# Patient Record
Sex: Female | Born: 1935 | Race: White | Hispanic: No | Marital: Single | Smoking: Former smoker
Health system: Southern US, Community
[De-identification: ages and names within clinical notes are randomized; demographics above are authoritative.]

## PROBLEM LIST (undated history)

## (undated) DIAGNOSIS — I10 Essential (primary) hypertension: Secondary | ICD-10-CM

## (undated) DIAGNOSIS — G2581 Restless legs syndrome: Secondary | ICD-10-CM

## (undated) DIAGNOSIS — K219 Gastro-esophageal reflux disease without esophagitis: Secondary | ICD-10-CM

## (undated) DIAGNOSIS — E785 Hyperlipidemia, unspecified: Secondary | ICD-10-CM

## (undated) DIAGNOSIS — J449 Chronic obstructive pulmonary disease, unspecified: Secondary | ICD-10-CM

## (undated) DIAGNOSIS — N318 Other neuromuscular dysfunction of bladder: Secondary | ICD-10-CM

## (undated) DIAGNOSIS — C4490 Unspecified malignant neoplasm of skin, unspecified: Secondary | ICD-10-CM

## (undated) HISTORY — DX: Essential (primary) hypertension: I10

## (undated) HISTORY — DX: Hyperlipidemia, unspecified: E78.5

## (undated) HISTORY — PX: SKIN CANCER EXCISION: SHX779

## (undated) HISTORY — DX: Gastro-esophageal reflux disease without esophagitis: K21.9

## (undated) HISTORY — DX: Other neuromuscular dysfunction of bladder: N31.8

## (undated) HISTORY — DX: Unspecified malignant neoplasm of skin, unspecified: C44.90

## (undated) HISTORY — DX: Chronic obstructive pulmonary disease, unspecified: J44.9

## (undated) HISTORY — DX: Restless legs syndrome: G25.81

---

## 1998-12-16 ENCOUNTER — Emergency Department (HOSPITAL_COMMUNITY): Admission: EM | Admit: 1998-12-16 | Discharge: 1998-12-16 | Payer: Self-pay | Admitting: Emergency Medicine

## 1998-12-16 ENCOUNTER — Encounter: Payer: Self-pay | Admitting: Emergency Medicine

## 1999-03-22 ENCOUNTER — Other Ambulatory Visit: Admission: RE | Admit: 1999-03-22 | Discharge: 1999-03-22 | Payer: Self-pay | Admitting: Internal Medicine

## 1999-05-11 ENCOUNTER — Encounter: Payer: Self-pay | Admitting: Internal Medicine

## 1999-05-11 ENCOUNTER — Encounter: Admission: RE | Admit: 1999-05-11 | Discharge: 1999-05-11 | Payer: Self-pay | Admitting: Internal Medicine

## 2000-05-13 ENCOUNTER — Encounter: Admission: RE | Admit: 2000-05-13 | Discharge: 2000-05-13 | Payer: Self-pay | Admitting: Internal Medicine

## 2000-05-13 ENCOUNTER — Encounter: Payer: Self-pay | Admitting: Internal Medicine

## 2000-07-22 ENCOUNTER — Encounter: Admission: RE | Admit: 2000-07-22 | Discharge: 2000-07-22 | Payer: Self-pay | Admitting: Internal Medicine

## 2000-07-22 ENCOUNTER — Encounter: Payer: Self-pay | Admitting: Internal Medicine

## 2001-06-18 ENCOUNTER — Encounter: Admission: RE | Admit: 2001-06-18 | Discharge: 2001-06-18 | Payer: Self-pay | Admitting: Internal Medicine

## 2001-06-18 ENCOUNTER — Encounter: Payer: Self-pay | Admitting: Internal Medicine

## 2002-07-07 ENCOUNTER — Encounter: Payer: Self-pay | Admitting: Internal Medicine

## 2002-07-07 ENCOUNTER — Encounter: Admission: RE | Admit: 2002-07-07 | Discharge: 2002-07-07 | Payer: Self-pay | Admitting: Internal Medicine

## 2003-08-04 ENCOUNTER — Encounter: Admission: RE | Admit: 2003-08-04 | Discharge: 2003-08-04 | Payer: Self-pay | Admitting: Internal Medicine

## 2003-12-08 ENCOUNTER — Other Ambulatory Visit: Admission: RE | Admit: 2003-12-08 | Discharge: 2003-12-08 | Payer: Self-pay | Admitting: Internal Medicine

## 2003-12-20 ENCOUNTER — Ambulatory Visit (HOSPITAL_COMMUNITY): Admission: RE | Admit: 2003-12-20 | Discharge: 2003-12-20 | Payer: Self-pay | Admitting: Internal Medicine

## 2004-08-09 ENCOUNTER — Encounter: Admission: RE | Admit: 2004-08-09 | Discharge: 2004-08-09 | Payer: Self-pay | Admitting: Internal Medicine

## 2005-02-06 ENCOUNTER — Encounter: Admission: RE | Admit: 2005-02-06 | Discharge: 2005-02-06 | Payer: Self-pay | Admitting: Internal Medicine

## 2005-02-13 ENCOUNTER — Encounter: Admission: RE | Admit: 2005-02-13 | Discharge: 2005-02-13 | Payer: Self-pay | Admitting: Internal Medicine

## 2005-08-21 ENCOUNTER — Encounter: Admission: RE | Admit: 2005-08-21 | Discharge: 2005-08-21 | Payer: Self-pay | Admitting: Internal Medicine

## 2006-07-29 ENCOUNTER — Emergency Department (HOSPITAL_COMMUNITY): Admission: EM | Admit: 2006-07-29 | Discharge: 2006-07-29 | Payer: Self-pay | Admitting: Emergency Medicine

## 2006-08-21 ENCOUNTER — Other Ambulatory Visit: Admission: RE | Admit: 2006-08-21 | Discharge: 2006-08-21 | Payer: Self-pay | Admitting: *Deleted

## 2006-08-27 ENCOUNTER — Encounter: Admission: RE | Admit: 2006-08-27 | Discharge: 2006-08-27 | Payer: Self-pay | Admitting: *Deleted

## 2007-09-02 ENCOUNTER — Encounter: Admission: RE | Admit: 2007-09-02 | Discharge: 2007-09-02 | Payer: Self-pay | Admitting: *Deleted

## 2008-02-18 ENCOUNTER — Encounter: Admission: RE | Admit: 2008-02-18 | Discharge: 2008-02-18 | Payer: Self-pay | Admitting: Family Medicine

## 2008-09-02 ENCOUNTER — Encounter: Admission: RE | Admit: 2008-09-02 | Discharge: 2008-09-02 | Payer: Self-pay | Admitting: Family Medicine

## 2008-10-30 ENCOUNTER — Emergency Department (HOSPITAL_COMMUNITY): Admission: EM | Admit: 2008-10-30 | Discharge: 2008-10-30 | Payer: Self-pay | Admitting: Emergency Medicine

## 2009-04-05 ENCOUNTER — Encounter: Payer: Self-pay | Admitting: Emergency Medicine

## 2009-04-10 ENCOUNTER — Encounter: Payer: Self-pay | Admitting: Emergency Medicine

## 2009-09-14 ENCOUNTER — Encounter: Admission: RE | Admit: 2009-09-14 | Discharge: 2009-09-14 | Payer: Self-pay | Admitting: Family Medicine

## 2009-10-10 ENCOUNTER — Encounter: Payer: Self-pay | Admitting: Emergency Medicine

## 2009-10-12 ENCOUNTER — Ambulatory Visit (HOSPITAL_COMMUNITY): Admission: RE | Admit: 2009-10-12 | Discharge: 2009-10-12 | Payer: Self-pay | Admitting: Cardiology

## 2009-11-17 DIAGNOSIS — M810 Age-related osteoporosis without current pathological fracture: Secondary | ICD-10-CM | POA: Insufficient documentation

## 2009-11-17 DIAGNOSIS — K219 Gastro-esophageal reflux disease without esophagitis: Secondary | ICD-10-CM

## 2009-11-17 DIAGNOSIS — N318 Other neuromuscular dysfunction of bladder: Secondary | ICD-10-CM

## 2009-11-17 DIAGNOSIS — J449 Chronic obstructive pulmonary disease, unspecified: Secondary | ICD-10-CM | POA: Insufficient documentation

## 2009-11-17 DIAGNOSIS — G2581 Restless legs syndrome: Secondary | ICD-10-CM

## 2009-11-17 DIAGNOSIS — E559 Vitamin D deficiency, unspecified: Secondary | ICD-10-CM | POA: Insufficient documentation

## 2009-11-17 DIAGNOSIS — E785 Hyperlipidemia, unspecified: Secondary | ICD-10-CM

## 2009-11-20 ENCOUNTER — Ambulatory Visit: Payer: Self-pay | Admitting: Emergency Medicine

## 2009-11-20 DIAGNOSIS — R0989 Other specified symptoms and signs involving the circulatory and respiratory systems: Secondary | ICD-10-CM

## 2009-11-20 DIAGNOSIS — R0609 Other forms of dyspnea: Secondary | ICD-10-CM

## 2009-11-20 DIAGNOSIS — I10 Essential (primary) hypertension: Secondary | ICD-10-CM

## 2009-11-20 DIAGNOSIS — C44791 Other specified malignant neoplasm of skin of unspecified lower limb, including hip: Secondary | ICD-10-CM

## 2009-12-08 ENCOUNTER — Ambulatory Visit: Payer: Self-pay | Admitting: Emergency Medicine

## 2010-01-05 ENCOUNTER — Ambulatory Visit: Payer: Self-pay | Admitting: Emergency Medicine

## 2010-02-22 NOTE — Letter (Signed)
Summary: Donato Schultz MD/[facility]  Donato Schultz MD/[facility]   Imported By: [place] 11/29/2009 10:15:28  _____________________________________________________________________  External Attachment:    Type:   Image     Comment:   External Document

## 2010-02-22 NOTE — Assessment & Plan Note (Signed)
Summary: dyspnea, COPD, CAD   Visit Type:  Initial Consult Copy to:  Dr. Donato Schultz Primary Remington Skalsky/Referring Olando Willems:  Dr. Melford Aase  CC:  Pulmonary consult....  History of Present Illness: 77 yo former smoker, carries dx COPD made recently by PFTs, HTN, CAD with abnormal perfusion stress test 3/11. being treated by Dr Anne Fu. Referred for progressive exertional dyspnea. She states that she has always been very active, then 6 mo ago she noticed that she was needing to stop and rest during activities that she formerly was able to do without stopping. Occas hears wheeze, feels air hunger but no chest tightness or pain. Rare cough. Occas nasal gtt and drainage.   Preventive Screening-Counseling & Management  Alcohol-Tobacco     Alcohol drinks/day: 0     Smoking Status: quit     Smoking Cessation Counseling: yes     Smoke Cessation Stage: quit     Packs/Day: 1.0     Year Started: 1948     Year Quit: 2007     Pack years: 69     Tobacco Counseling: to remain off tobacco products  Current Medications (verified): 1)  Actonel 35 Mg Tabs (Risedronate Sodium) .Marland Kitchen.. 1 By Mouth Weekly 2)  Vitamin D 400 Unit Tabs (Cholecalciferol) .Marland Kitchen.. 1 By Mouth Daily 3)  Calcium 500 Mg Tabs (Calcium) .Marland Kitchen.. 1 By Mouth Daily 4)  Aspirin 81 Mg Tabs (Aspirin) .Marland Kitchen.. 1 By Mouth Daily 5)  Propranolol Hcl 20 Mg Tabs (Propranolol Hcl) .Marland Kitchen.. 1 By Mouth Two Times A Day 6)  Simvastatin 10 Mg Tabs (Simvastatin) .Marland Kitchen.. 1 By Mouth At Bedtime 7)  Aleve 220 Mg Tabs (Naproxen Sodium) .Marland Kitchen.. 1 By Mouth Daily As Needed  Allergies (verified): No Known Drug Allergies  Past History:  Past Medical History: Current Problems:  SKIN CANCER, LEG (ICD-173.7) HYPERTENSION (ICD-401.9) HYPERLIPIDEMIA (ICD-272.4) HYPERTONICITY OF BLADDER (ICD-596.51) VITAMIN D DEFICIENCY (ICD-268.9) RESTLESS LEG SYNDROME (ICD-333.94) OSTEOPOROSIS (ICD-733.00) G E R D (ICD-530.81) C O P D (ICD-496)  Past Surgical History: removal of  skin cancer lesions---leg, head, arm  Family History: Family History Emphysema ---2 brothers Family History C V A / Stroke ---sister Family History Hypertension---mother Family History Breast Cancer---sister  Social History: Patient states former smoker.  Retired from [employer] no childrenAlcohol drinks/day:  0 Smoking Status:  quit Packs/Day:  1.0 Pack years:  57  Review of Systems       The patient complains of shortness of breath with activity, irregular heartbeats, indigestion, nasal congestion/difficulty breathing through nose, ear ache, and joint stiffness or pain.  The patient denies shortness of breath at rest, productive cough, non-productive cough, coughing up blood, chest pain, acid heartburn, loss of appetite, weight change, abdominal pain, difficulty swallowing, sore throat, tooth/dental problems, headaches, sneezing, itching, anxiety, depression, hand/feet swelling, rash, change in color of mucus, and fever.    Vital Signs:  Patient profile:   77 year old female Height:      64 inches (162.56 cm) Weight:      155.13 pounds (70.51 kg) BMI:     26.72 O2 Sat:      91 % on Room air Temp:     97.6 degrees F (36.44 degrees C) oral Pulse rate:   69 / minute BP sitting:   112 / 78  (left arm) Cuff size:   regular  Vitals Entered By: Michel Bickers CMA (November 20, 2009 1:27 PM)  O2 Sat at Rest %:  91 O2 Flow:  Room air CC: Pulmonary consult... Comments  Medications reviewed with patient Michel Bickers [facility]  November 20, 2009 1:45 PM   Physical Exam  General:  Pleasant elderly woman, NAD, overwt.  Head:  normocephalic and atraumatic Eyes:  conjunctiva and sclera clear Nose:  no deformity, discharge, inflammation, or lesions Mouth:  no deformity or lesions Neck:  no masses, thyromegaly, or abnormal cervical nodes Lungs:  distant, no wheeze Heart:  regular rate and rhythm, S1, S2 without murmurs, rubs, gallops, or clicks Abdomen:  not examined Msk:  no deformity or  scoliosis noted with normal posture Extremities:  no clubbing, cyanosis, edema, or deformity noted Neurologic:  non-focal Skin:  intact without lesions or rashes Psych:  alert and cooperative; normal mood and affect; normal attention span and concentration   Echocardiogram  Procedure date:  04/10/2009  Findings:      mild asymmetric septal hypertrophy, no regional wall motion abnormality, LVEF 60-65% mild-to-mod MVR, normal RA  MISC. Report  Procedure date:  04/10/2009  Findings:      Nuclear Stress Test: mildly abnormal perfusion study w possible anteriolateral wall ischemia, new from 2006  Pulmonary Function Test Date: 10/12/2009 Height (in.): 61 Gender: Female  Pre-Spirometry FVC    Value: 1.32 L/min   Pred: 2.21 L/min     % Pred: 59 % FEV1    Value: 0.96 L     Pred: 1.63 L     % Pred: 58 % FEV1/FVC  Value: 72 %     Pred: 74 %     % Pred: - % FEF 25-75  Value: 0.64 L/min   Pred: 1.19 L/min     % Pred: 53 %  Lung Volumes DLCO    Value: 220.22 %   % Pred: 49 % DLCO/VA  Value: 4.40 %   % Pred: 99 %  Comments: Combined restriction and obstruction, moderately severe decrease in FEV1. DLCO is low and corrects to normal range for Va. RSB  Impression & Recommendations:  Problem # 1:  DYSPNEA ON EXERTION (ICD-786.09)  PFT with evidence for AFL and probable COPD, certainly a probable component of her dyspnea. Need to remember that she does have CAD based on her stress testing, the SOB could reflect angina.   Orders: Consultation Level IV (16109)  Problem # 2:  C O P D (ICD-496) Trial Spiriva x 20 days, then f/u to assess improvement on therapy.  Walking oximetry no desat  Patient Instructions: 1)  Your oxygen level does not drop when you walk.  2)  We will try Spiriva 1 inhalation once daily for 20 days to see if this helps your breathing.  3)  Follow with Dr Delton Coombes in 1 month to discuss your symptoms on the medication.   Appended Document: dyspnea, COPD,  CAD Ambulatory Pulse Oximetry  Resting; HR__68___    02 Sat__94%ra___  Lap1 (185 feet)   HR__90___   02 Sat__90%ra___ Lap2 (185 feet)   HR__98___   02 Sat__91%ra___    Lap3 (185 feet)   HR__96___   02 Sat__92%ra___  _x__Test Completed without Difficulty ___Test Stopped due to:

## 2010-02-22 NOTE — Assessment & Plan Note (Signed)
Summary: COPD, dyspnea   Visit Type:  Follow-up Copy to:  Dr. Donato Schultz Primary Provider/Referring Provider:  Dr. Melford Aase  CC:  3 week follow up, pt started on spiriva at last visit, states she sees no difference in sob , c/o dry non-prod cough, and dry throat since taking spiriva.  History of Present Illness: 77 yo former smoker, carries dx COPD made recently by PFTs, HTN, CAD with abnormal perfusion stress test 3/11. being treated by Dr Anne Fu. Referred for progressive exertional dyspnea. She states that she has always been very active, then 6 mo ago she noticed that she was needing to stop and rest during activities that she formerly was able to do without stopping. Occas hears wheeze, feels air hunger but no chest tightness or pain. Rare cough. Occas nasal gtt and drainage.   ROV 12/08/09 -- f/u dyspnea, AFL by spiro, also hx CAD. Last time we started Spiriva to see if she would benefit. She may have benefitted some, still has to stop to rest when walking. She has noticed dry  mouth, evolving a dry throat and cough.   Preventive Screening-Counseling & Management  Alcohol-Tobacco     Alcohol drinks/day: 0     Smoking Status: quit     Smoking Cessation Counseling: yes     Smoke Cessation Stage: quit     Packs/Day: 1.0     Year Started: 1948     Year Quit: 2007     Pack years: 33     Tobacco Counseling: to remain off tobacco products  Current Medications (verified): 1)  Actonel 35 Mg Tabs (Risedronate Sodium) .Marland Kitchen.. 1 By Mouth Weekly 2)  Vitamin D 400 Unit Tabs (Cholecalciferol) .Marland Kitchen.. 1 By Mouth Daily 3)  Calcium 500 Mg Tabs (Calcium) .Marland Kitchen.. 1 By Mouth Daily 4)  Aspirin 81 Mg Tabs (Aspirin) .Marland Kitchen.. 1 By Mouth Daily 5)  Propranolol Hcl 20 Mg Tabs (Propranolol Hcl) .Marland Kitchen.. 1 By Mouth Two Times A Day 6)  Simvastatin 10 Mg Tabs (Simvastatin) .Marland Kitchen.. 1 By Mouth At Bedtime 7)  Aleve 220 Mg Tabs (Naproxen Sodium) .Marland Kitchen.. 1 By Mouth Daily As Needed 8)  Spiriva Handihaler 18 Mcg Caps  (Tiotropium Bromide Monohydrate) .Marland Kitchen.. 1 Once Daily  Allergies (verified): No Known Drug Allergies  Vital Signs:  Patient profile:   77 year old female Height:      64 inches Weight:      155.2 pounds BMI:     26.74 O2 Sat:      97 % on Room air Temp:     97.6 degrees F oral Pulse rate:   75 / minute BP sitting:   120 / 72  (left arm)  Vitals Entered By: Renold Genta RCP, LPN (December 08, 2009 11:17 AM)  O2 Flow:  Room air CC: 3 week follow up, pt started on spiriva at last visit, states she sees no difference in sob , c/o dry non-prod cough, dry throat since taking spiriva Is Patient Diabetic? No Comments Medications reviewed with patient Renold Genta RCP, LPN  December 08, 2009 11:21 AM    Physical Exam  General:  Pleasant elderly woman, NAD, overwt.  Head:  normocephalic and atraumatic Eyes:  conjunctiva and sclera clear Nose:  no deformity, discharge, inflammation, or lesions Mouth:  no deformity or lesions Neck:  no masses, thyromegaly, or abnormal cervical nodes Lungs:  distant, no wheeze Heart:  regular rate and rhythm, S1, S2 without murmurs, rubs, gallops, or clicks Abdomen:  not examined Msk:  no deformity or scoliosis noted with normal posture Extremities:  no clubbing, cyanosis, edema, or deformity noted Neurologic:  non-focal Skin:  intact without lesions or rashes Psych:  alert and cooperative; normal mood and affect; normal attention span and concentration   Impression & Recommendations:  Problem # 1:  C O P D (ICD-496) Hasn't tolerated Spiriva, would like to trial Symbicort two times a day x 1 month, see if she benefits.   Problem # 2:  DYSPNEA ON EXERTION (ICD-786.09)  Medications Added to Medication List This Visit: 1)  Spiriva Handihaler 18 Mcg Caps (Tiotropium bromide monohydrate) .Marland Kitchen.. 1 once daily  Other Orders: Est. Patient Level IV (16109)  Patient Instructions: 1)  Stop Spiriva now 2)  Stay off of any inhaled med for 1 week 3)   In a week start Symbicort 80/4.53mcg, 2 puff two times a day  4)  Follow up with Dr Delton Coombes in 1 month to decide if the medication is helping 5)  Rinse your mouth out after you use the Symbicort

## 2010-02-22 NOTE — Assessment & Plan Note (Signed)
Summary: COPD   Visit Type:  Follow-up Copy to:  Dr. Donato Schultz Primary Provider/Referring Provider:  Dr. Melford Aase  CC:  COPD...pt says her breathing is slightly better on Symbicort.  History of Present Illness: 77 yo former smoker, carries dx COPD made recently by PFTs, HTN, CAD with abnormal perfusion stress test 3/11. being treated by Dr Anne Fu. Referred for progressive exertional dyspnea. She states that she has always been very active, then 6 mo ago she noticed that she was needing to stop and rest during activities that she formerly was able to do without stopping. Occas hears wheeze, feels air hunger but no chest tightness or pain. Rare cough. Occas nasal gtt and drainage.   ROV 12/08/09 -- f/u dyspnea, AFL by spiro, also hx CAD. Last time we started Spiriva to see if she would benefit. She may have benefitted some, still has to stop to rest when walking. She has noticed dry  mouth, evolving a dry throat and cough.   ROV 01/05/10 -- f/u for COPD, dyspnea. last time we stopped Spiriva and started Symbicort 80 two times a day, she believes that she is a bit better, exercise tolerance better. Cough resolved with d/c Spiriva. No desat with walking at initial visit.   Preventive Screening-Counseling & Management  Alcohol-Tobacco     Alcohol drinks/day: 0     Smoking Status: quit     Smoking Cessation Counseling: yes     Smoke Cessation Stage: quit     Packs/Day: 1.0     Year Started: 1948     Year Quit: 2007     Pack years: 13     Tobacco Counseling: to remain off tobacco products  Current Medications (verified): 1)  Actonel 35 Mg Tabs (Risedronate Sodium) .Marland Kitchen.. 1 By Mouth Weekly 2)  Vitamin D 400 Unit Tabs (Cholecalciferol) .Marland Kitchen.. 1 By Mouth Daily 3)  Calcium 500 Mg Tabs (Calcium) .Marland Kitchen.. 1 By Mouth Daily 4)  Aspirin 81 Mg Tabs (Aspirin) .Marland Kitchen.. 1 By Mouth Daily 5)  Propranolol Hcl 20 Mg Tabs (Propranolol Hcl) .Marland Kitchen.. 1 By Mouth Two Times A Day 6)  Simvastatin 10 Mg Tabs  (Simvastatin) .Marland Kitchen.. 1 By Mouth At Bedtime 7)  Aleve 220 Mg Tabs (Naproxen Sodium) .Marland Kitchen.. 1 By Mouth Daily As Needed 8)  Symbicort 80-4.5 Mcg/act Aero (Budesonide-Formoterol Fumarate) .... 2 Puffs Two Times A Day  Allergies (verified): No Known Drug Allergies  Vital Signs:  Patient profile:   77 year old female Height:      64 inches (162.56 cm) Weight:      155.50 pounds (70.68 kg) BMI:     26.79 O2 Sat:      94 % on Room air Temp:     97.8 degrees F (36.56 degrees C) oral Pulse rate:   77 / minute BP sitting:   100 / 60  (left arm) Cuff size:   regular  Vitals Entered By: Michel Bickers CMA (January 05, 2010 2:25 PM)  O2 Sat at Rest %:  94 O2 Flow:  Room air CC: COPD...pt says her breathing is slightly better on Symbicort Is Patient Diabetic? No Comments Medications reviewed with patient Michel Bickers [facility]  January 05, 2010 2:25 PM   Physical Exam  General:  Pleasant elderly woman, NAD, overwt.  Head:  normocephalic and atraumatic Eyes:  conjunctiva and sclera clear Nose:  no deformity, discharge, inflammation, or lesions Mouth:  no deformity or lesions Neck:  no masses, thyromegaly, or abnormal cervical nodes Lungs:  distant,  no wheeze Heart:  regular rate and rhythm, S1, S2 without murmurs, rubs, gallops, or clicks Abdomen:  not examined Msk:  no deformity or scoliosis noted with normal posture Extremities:  no clubbing, cyanosis, edema, or deformity noted Neurologic:  non-focal Skin:  intact without lesions or rashes Psych:  alert and cooperative; normal mood and affect; normal attention span and concentration   Impression & Recommendations:  Problem # 1:  C O P D (ICD-496) contin symbicort + as needed SABA rov 4 mon  Medications Added to Medication List This Visit: 1)  Symbicort 80-4.5 Mcg/act Aero (Budesonide-formoterol fumarate) .... 2 puffs two times a day 2)  Proair Hfa 108 (90 Base) Mcg/act Aers (Albuterol sulfate) .... 2 puffs q4h as needed sob  Other  Orders: Est. Patient Level IV (04540)  Patient Instructions: 1)  Please continue Symbicort two times a day  2)  Use ProAir 2 puff up to every 4 hours if needed for shortness of breath 3)  Follow up with Dr Delton Coombes in 4 months  Prescriptions: PROAIR HFA 108 (90 BASE) MCG/ACT AERS (ALBUTEROL SULFATE) 2 puffs q4h as needed sob  #1 x 5   Entered and Authorized by:   Leslye Peer MD   Signed by:   Leslye Peer MD on 01/05/2010   Method used:   Electronically to        [facility]* (retail)       [address]       [place], [place]  [address]       Ph: [phone]       Fax: [phone]   RxID:   [id number] SYMBICORT 80-4.5 MCG/ACT AERO (BUDESONIDE-FORMOTEROL FUMARATE) 2 puffs two times a day  #1 x 11   Entered and Authorized by:   Leslye Peer MD   Signed by:   Leslye Peer MD on 01/05/2010   Method used:   Electronically to        [facility]* (retail)       [address]       [place], [place]  [id number]       Ph: [phone]       Fax: [phone]   RxID:   [id number]    Immunization History:  Influenza Immunization History:    Influenza:  historical (11/21/2009)  Pneumovax Immunization History:    Pneumovax:  historical (01/21/2006)

## 2010-04-06 ENCOUNTER — Encounter: Payer: Self-pay | Admitting: Emergency Medicine

## 2010-04-06 ENCOUNTER — Ambulatory Visit (INDEPENDENT_AMBULATORY_CARE_PROVIDER_SITE_OTHER): Payer: Medicare Other | Admitting: Emergency Medicine

## 2010-04-06 DIAGNOSIS — J449 Chronic obstructive pulmonary disease, unspecified: Secondary | ICD-10-CM

## 2010-04-10 NOTE — Assessment & Plan Note (Signed)
Summary: COPD   Visit Type:  Follow-up Copy to:  [facility] Primary Provider/Referring Provider:  Dr. Reyes Ivan @ [facility]  CC:  COPD.Marland KitchenMarland KitchenPatient says her breathing has improved and she can walk further...she does c/o cough x2 weeks w/ clear sputum...PND and hoarseness.  History of Present Illness: 77 yo former smoker, carries dx COPD made recently by PFTs, HTN, CAD with abnormal perfusion stress test 3/11, being treated now by Dr Donnie Aho. Referred for exertional SOB.  ROV 12/08/09 -- f/u dyspnea, AFL by spiro, also hx CAD. Last time we started Spiriva to see if she would benefit. She may have benefitted some, still has to stop to rest when walking. She has noticed dry mouth, evolving a dry throat and cough.   ROV 01/05/10 -- f/u for COPD, dyspnea. last time we stopped Spiriva and started Symbicort 80 two times a day, she believes that she is a bit better, exercise tolerance better. Cough resolved with d/c Spiriva. No desat with walking at initial visit.   ROV 04/06/10 -- COPD. Feels that she is doing well. She did have a URI about 3 weeks ago, has some residual cough. Has dry cough. Breathes better on the Symbicort. Some clear drainage. Changed cardiologists to Dr Donnie Aho. Propranolol decreased to 10mg  two times a day.   Preventive Screening-Counseling & Management  Alcohol-Tobacco     Alcohol drinks/day: 0     Smoking Status: quit     Packs/Day: 1.0     Year Started: 1948     Year Quit: 2007     Pack years: 40  Current Medications (verified): 1)  Actonel 35 Mg Tabs (Risedronate Sodium) .Marland Kitchen.. 1 By Mouth Weekly 2)  Vitamin D 400 Unit Tabs (Cholecalciferol) .Marland Kitchen.. 1 By Mouth Daily 3)  Calcium 500 Mg Tabs (Calcium) .Marland Kitchen.. 1 By Mouth Daily 4)  Aspirin 81 Mg Tabs (Aspirin) .Marland Kitchen.. 1 By Mouth Daily 5)  Propranolol Hcl 10 Mg Tabs (Propranolol Hcl) .Marland Kitchen.. 1 By Mouth Two Times A Day 6)  Simvastatin 10 Mg Tabs (Simvastatin) .Marland Kitchen.. 1 By Mouth At Bedtime 7)  Aleve 220 Mg Tabs (Naproxen Sodium) .Marland Kitchen.. 1 By Mouth Daily As  Needed 8)  Symbicort 80-4.5 Mcg/act Aero (Budesonide-Formoterol Fumarate) .... 2 Puffs Two Times A Day 9)  Proair Hfa 108 (90 Base) Mcg/act Aers (Albuterol Sulfate) .... 2 Puffs Q4h As Needed Sob  Allergies (verified): No Known Drug Allergies  Vital Signs:  Patient profile:   77 year old female Height:      64 inches (162.56 cm) Weight:      154.25 pounds (70.11 kg) BMI:     26.57 O2 Sat:      96 % on Room air Temp:     97.4 degrees F (36.33 degrees C) oral Pulse rate:   74 / minute BP sitting:   122 / 70  (left arm) Cuff size:   regular  Vitals Entered By: Michel Bickers CMA (April 06, 2010 10:56 AM)  O2 Sat at Rest %:  96 O2 Flow:  Room air CC: COPD.Marland KitchenMarland KitchenPatient says her breathing has improved and she can walk further...she does c/o cough x2 weeks w/ clear sputum...PND and hoarseness Comments Medications reviewed with patient                Pt now sees Dr. Reyes Ivan @ [facility] as her PCP and new cardiologist is Dr. Viann Fish. Michel Bickers CMA  April 06, 2010 11:04 AM   Physical Exam  General:  Pleasant elderly woman, NAD, overwt.  Head:  normocephalic and atraumatic Eyes:  conjunctiva and sclera clear Nose:  no deformity, discharge, inflammation, or lesions Mouth:  no deformity or lesions Neck:  no masses, thyromegaly, or abnormal cervical nodes Lungs:  distant, no wheeze Heart:  S1, S2, late systolic M Abdomen:  not examined Msk:  no deformity or scoliosis noted with normal posture Extremities:  no clubbing, cyanosis, edema, or deformity noted Neurologic:  non-focal Skin:  intact without lesions or rashes Psych:  alert and cooperative; normal mood and affect; normal attention span and concentration   Impression & Recommendations:  Problem # 1:  C O P D (ICD-496)  Medications Added to Medication List This Visit: 1)  Propranolol Hcl 10 Mg Tabs (Propranolol hcl) .Marland Kitchen.. 1 by mouth two times a day  Other Orders: Est. Patient Level III (04540)  Patient Instructions: 1)   Continue your Symbicort two times a day  2)  Follow up with Dr Delton Coombes in 6 months or as needed

## 2010-04-26 LAB — URINE CULTURE

## 2010-04-26 LAB — POCT URINALYSIS DIP (DEVICE)
Protein, ur: 100 mg/dL — AB
Urobilinogen, UA: 0.2 mg/dL (ref 0.0–1.0)
pH: 5.5 (ref 5.0–8.0)

## 2010-08-08 ENCOUNTER — Other Ambulatory Visit: Payer: Self-pay | Admitting: Family Medicine

## 2010-08-09 ENCOUNTER — Other Ambulatory Visit: Payer: Self-pay | Admitting: Internal Medicine

## 2010-08-09 DIAGNOSIS — Z780 Asymptomatic menopausal state: Secondary | ICD-10-CM

## 2010-08-09 DIAGNOSIS — Z0000 Encounter for general adult medical examination without abnormal findings: Secondary | ICD-10-CM

## 2010-08-09 DIAGNOSIS — Z1231 Encounter for screening mammogram for malignant neoplasm of breast: Secondary | ICD-10-CM

## 2010-09-18 ENCOUNTER — Ambulatory Visit
Admission: RE | Admit: 2010-09-18 | Discharge: 2010-09-18 | Disposition: A | Discharge status: Home / Self Care | Payer: Medicare Other | Source: Ambulatory Visit | Attending: Internal Medicine | Admitting: Internal Medicine

## 2010-09-18 ENCOUNTER — Ambulatory Visit: Payer: Medicare Other

## 2010-09-18 ENCOUNTER — Other Ambulatory Visit: Payer: Medicare Other

## 2010-09-18 DIAGNOSIS — Z1231 Encounter for screening mammogram for malignant neoplasm of breast: Secondary | ICD-10-CM

## 2010-09-18 DIAGNOSIS — Z780 Asymptomatic menopausal state: Secondary | ICD-10-CM

## 2010-10-08 ENCOUNTER — Encounter: Payer: Self-pay | Admitting: Emergency Medicine

## 2010-10-09 ENCOUNTER — Encounter: Payer: Self-pay | Admitting: Emergency Medicine

## 2010-10-09 ENCOUNTER — Ambulatory Visit (INDEPENDENT_AMBULATORY_CARE_PROVIDER_SITE_OTHER): Payer: Medicare Other | Admitting: Emergency Medicine

## 2010-10-09 DIAGNOSIS — J449 Chronic obstructive pulmonary disease, unspecified: Secondary | ICD-10-CM

## 2010-10-09 NOTE — Patient Instructions (Signed)
Please continue your Symbicort 2 puffs twice a day Use ProAir if needed for shortness of breath Get your flu shot this Fall Follow with Dr Delton Coombes in 6 months or sooner if you have any problems.

## 2010-10-09 NOTE — Assessment & Plan Note (Signed)
Stable - continue Symbicort - prn ProAir - flu shot in Fall - rov 6 mo or prn

## 2010-10-09 NOTE — Progress Notes (Signed)
76 yo former smoker, carries dx COPD made recently by PFTs, HTN, CAD with abnormal perfusion stress test 3/11.  ROV 12/08/09 -- f/u dyspnea, AFL by spiro, also hx CAD. Last time we started Spiriva to see if she would benefit. She may have benefitted some, still has to stop to rest when walking. She has noticed dry mouth, evolving a dry throat and cough.   ROV 01/05/10 -- f/u for COPD, dyspnea. last time we stopped Spiriva and started Symbicort 80 two times a day, she believes that she is a bit better, exercise tolerance better. Cough resolved with d/c Spiriva. No desat with walking at initial visit.   ROV 04/06/10 -- COPD. Feels that she is doing well. She did have a URI about 3 weeks ago, has some residual cough. Has dry cough. Breathes better on the Symbicort. Some clear drainage. Changed cardiologists to Dr Donnie Aho. Propranolol decreased to 10mg  two times a day.   ROV 10/09/10 -- COPD, last seen 6 months ago. She believes that she is a bit better than last time, less exertional SOB. She remains on Symbicort 80 bid, doesn't need ProAir. No wheeze or cough. No exacerbations. Pneumovax in 2008.   Gen: Pleasant, elderly woman, in no distress,  normal affect  ENT: No lesions,  mouth clear,  oropharynx clear, no postnasal drip  Neck: No JVD, no TMG, no carotid bruits  Lungs: No use of accessory muscles, no dullness to percussion, clear without rales or rhonchi  Cardiovascular: RRR, heart sounds normal, no murmur or gallops, no peripheral edema  Musculoskeletal: No deformities, no cyanosis or clubbing  Neuro: alert, non focal  Skin: Warm, no lesions or rashes   C O P D Stable - continue Symbicort - prn ProAir - flu shot in Fall - rov 6 mo or prn

## 2010-11-06 LAB — I-STAT 8, (EC8 V) (CONVERTED LAB)
Acid-Base Excess: 1
Bicarbonate: 29.5 — ABNORMAL HIGH
HCT: 38
Sodium: 139
TCO2: 31
pCO2, Ven: 61.6 — ABNORMAL HIGH

## 2010-11-06 LAB — POCT I-STAT CREATININE
Creatinine, Ser: 1
Operator id: 234501

## 2011-01-17 ENCOUNTER — Other Ambulatory Visit: Payer: Self-pay | Admitting: Emergency Medicine

## 2011-05-02 ENCOUNTER — Encounter: Payer: Self-pay | Admitting: Emergency Medicine

## 2011-05-02 ENCOUNTER — Ambulatory Visit (INDEPENDENT_AMBULATORY_CARE_PROVIDER_SITE_OTHER): Payer: Medicare Other | Admitting: Emergency Medicine

## 2011-05-02 VITALS — BP 112/70 | HR 77 | Temp 98.0°F | Ht 64.5 in | Wt 144.6 lb

## 2011-05-02 DIAGNOSIS — J449 Chronic obstructive pulmonary disease, unspecified: Secondary | ICD-10-CM

## 2011-05-02 NOTE — Progress Notes (Signed)
78 yo former smoker, carries dx COPD made recently by PFTs, HTN, CAD with abnormal perfusion stress test 3/11.  ROV 12/08/09 -- f/u dyspnea, AFL by spiro, also hx CAD. Last time we started Spiriva to see if she would benefit. She may have benefitted some, still has to stop to rest when walking. She has noticed dry mouth, evolving a dry throat and cough.   ROV 01/05/10 -- f/u for COPD, dyspnea. last time we stopped Spiriva and started Symbicort 80 two times a day, she believes that she is a bit better, exercise tolerance better. Cough resolved with d/c Spiriva. No desat with walking at initial visit.   ROV 04/06/10 -- COPD. Feels that she is doing well. She did have a URI about 3 weeks ago, has some residual cough. Has dry cough. Breathes better on the Symbicort. Some clear drainage. Changed cardiologists to Dr Donnie Aho. Propranolol decreased to 10mg  two times a day.   ROV 10/09/10 -- COPD, last seen 6 months ago. She believes that she is a bit better than last time, less exertional SOB. She remains on Symbicort 80 bid, doesn't need ProAir. No wheeze or cough. No exacerbations. Pneumovax in 2008.   ROV 05/02/11 -- COPD here for regular f/u. On Symbicort 80, has decreased it to qam. Hasn't missed the evening dose. She has never used her rescue inhaler. No exacerbations, no hospitalizations. She has had some shoulder problems since last time, has responded well to therapy.   Filed Vitals:   05/02/11 1043  BP: 112/70  Pulse: 77  Temp: 98 F (36.7 C)   Gen: Pleasant, elderly woman, in no distress,  normal affect  ENT: No lesions,  mouth clear,  oropharynx clear, no postnasal drip  Neck: No JVD, no TMG, no carotid bruits  Lungs: No use of accessory muscles, no dullness to percussion, clear without rales or rhonchi  Cardiovascular: RRR, heart sounds normal, no murmur or gallops, no peripheral edema  Musculoskeletal: No deformities, no cyanosis or clubbing  Neuro: alert, non focal  Skin: Warm, no  lesions or rashes   C O P D Stable - will try coming off Symbicort to see if she misses it.  Will refill the SABA ROV 6 mo

## 2011-05-02 NOTE — Patient Instructions (Addendum)
We will try stopping your Symbicort in the mornings to see if you miss it. If your breathing worsens with this change, please restart it every morning We will reorder your ProAir for you to have available for emergencies Follow with Dr Delton Coombes in 6 months or sooner if you have any problems

## 2011-05-02 NOTE — Assessment & Plan Note (Signed)
Stable - will try coming off Symbicort to see if she misses it.  Will refill the SABA ROV 6 mo

## 2011-05-06 ENCOUNTER — Other Ambulatory Visit: Payer: Self-pay | Admitting: Emergency Medicine

## 2011-10-16 ENCOUNTER — Emergency Department (HOSPITAL_COMMUNITY)
Admission: EM | Admit: 2011-10-16 | Discharge: 2011-10-16 | Disposition: A | Discharge status: Home / Self Care | Payer: Medicare Other | Source: Home / Self Care | Attending: Emergency Medicine | Admitting: Emergency Medicine

## 2011-10-16 ENCOUNTER — Encounter (HOSPITAL_COMMUNITY): Payer: Self-pay | Admitting: Emergency Medicine

## 2011-10-16 ENCOUNTER — Emergency Department (INDEPENDENT_AMBULATORY_CARE_PROVIDER_SITE_OTHER): Payer: Medicare Other

## 2011-10-16 DIAGNOSIS — M25539 Pain in unspecified wrist: Secondary | ICD-10-CM

## 2011-10-16 DIAGNOSIS — M549 Dorsalgia, unspecified: Secondary | ICD-10-CM

## 2011-10-16 DIAGNOSIS — W19XXXA Unspecified fall, initial encounter: Secondary | ICD-10-CM

## 2011-10-16 MED ORDER — IBUPROFEN 600 MG PO TABS
600.0000 mg | ORAL_TABLET | Freq: Once | ORAL | Status: AC
  Administered 2011-10-16: 600 mg via ORAL

## 2011-10-16 MED ORDER — TRAMADOL HCL 50 MG PO TABS: 50.0000 mg | ORAL_TABLET | Freq: Four times a day (QID) | ORAL | Status: DC | PRN

## 2011-10-16 MED ORDER — MELOXICAM 7.5 MG PO TABS: 7.5000 mg | ORAL_TABLET | Freq: Every day | ORAL | Status: DC

## 2011-10-16 MED ORDER — CYCLOBENZAPRINE HCL 7.5 MG PO TABS: 7.5000 mg | ORAL_TABLET | Freq: Two times a day (BID) | ORAL | Status: DC | PRN

## 2011-10-16 NOTE — ED Provider Notes (Signed)
History     CSN: [id number]  Arrival date & time 10/16/11  1909   First MD Initiated Contact with Patient 10/16/11 2035      Chief Complaint  Patient presents with  . Fall    (Consider location/radiation/quality/duration/timing/severity/associated sxs/prior treatment) Patient is a 78 y.o. female presenting with fall.  Fall The accident occurred 3 to 5 hours ago. The fall occurred while standing. She landed on concrete. The point of impact was the left wrist and left knee. The pain is present in the left wrist and left knee (low back pain). The pain is at a severity of 9/10. The pain is moderate. She was ambulatory at the scene. There was no entrapment after the fall. There was no drug use involved in the accident. There was no alcohol use involved in the accident. Prehospitalization: none. She has tried NSAIDs for the symptoms. The treatment provided mild relief.  patient complains of mostly low back pain, no history of same, states she landed on outstretched left hand and left knee.  Past Medical History  Diagnosis Date  . Skin cancer   . HTN (hypertension)   . Hyperlipidemia   . Hypertonicity of bladder   . Restless leg syndrome   . Osteoporosis   . GERD (gastroesophageal reflux disease)   . COPD (chronic obstructive pulmonary disease)     Past Surgical History  Procedure Date  . Skin cancer excision     Family History  Problem Relation Age of Onset  . Emphysema Brother   . Emphysema Brother   . Stroke Sister   . Hypertension Mother   . Breast cancer Sister     History  Substance Use Topics  . Smoking status: Former Smoker -- 1.0 packs/day for 50 years    Types: Cigarettes    Quit date: 01/21/2005  . Smokeless tobacco: Not on file  . Alcohol Use: Not on file    OB History    Grav Para Term Preterm Abortions TAB SAB Ect Mult Living                  Review of Systems  Allergies  Review of patient's allergies indicates no known allergies.  Home  Medications   Current Outpatient Rx  Name Route Sig Dispense Refill  . ASPIRIN 81 MG PO TABS Oral Take 81 mg by mouth daily.      Marland Kitchen CALCIUM GLUCONATE 500 MG PO TABS Oral Take 500 mg by mouth daily.      Marland Kitchen NAPROXEN SODIUM 220 MG PO CAPS Oral Take 1 capsule by mouth daily as needed.    Marland Kitchen PROAIR HFA 108 (90 BASE) MCG/ACT IN AERS  USE 2 PUFFS EVERY 4 HOURS AS NEEDED FOR SHORTNESS OF BREATH. 1 Inhaler 5  . PROPRANOLOL HCL 10 MG PO TABS Oral Take 10 mg by mouth 2 (two) times daily.      . SYMBICORT 80-4.5 MCG/ACT IN AERO  USE 2 PUFFS TWICE A DAY. 10.2 g 3  . VITAMIN D 400 UNITS PO TABS Oral Take 400 Units by mouth daily.      . CYCLOBENZAPRINE HCL 7.5 MG PO TABS Oral Take 1 tablet (7.5 mg total) by mouth 2 (two) times daily as needed for muscle spasms. 20 tablet 0  . MELOXICAM 7.5 MG PO TABS Oral Take 1 tablet (7.5 mg total) by mouth daily. 30 tablet 0  . TRAMADOL HCL 50 MG PO TABS Oral Take 1 tablet (50 mg total) by mouth every 6 (six)  hours as needed for pain. 15 tablet 0    BP 159/75  Pulse 80  Temp 97.8 F (36.6 C) (Oral)  Resp 19  SpO2 98%  Physical Exam  Nursing note and vitals reviewed. Constitutional: She is oriented to person, place, and time. Vital signs are normal. She appears well-developed and well-nourished. She is active and cooperative.  HENT:  Head: Normocephalic.  Eyes: Conjunctivae normal are normal. Pupils are equal, round, and reactive to light. No scleral icterus.  Neck: Trachea normal. Neck supple.  Cardiovascular: Normal rate and regular rhythm.   Pulmonary/Chest: Effort normal and breath sounds normal.  Musculoskeletal:       Left wrist: Normal.       Left knee: Normal.       Cervical back: Normal.       Thoracic back: Normal.       Lumbar back: Normal.       Bruising to left wrist/hand, abrasion to left patella  Neurological: She is alert and oriented to person, place, and time. No cranial nerve deficit or sensory deficit.  Skin: Skin is warm and dry.    Psychiatric: She has a normal mood and affect. Her speech is normal and behavior is normal. Judgment and thought content normal. Cognition and memory are normal.    ED Course  Procedures (including critical care time)  Labs Reviewed - No data to display No results found.   1. Fall   2. Wrist pain   3. Back pain       MDM  Left wrist xray-IMPRESSION:  No fracture or dislocation or acute finding Take medications as prescribed, may use heat on low back for added comfort.  No imaging indicated for knee if any change rtc for further evaluation.        Johnsie Kindred, NP 10/21/11 1559

## 2011-10-16 NOTE — ED Notes (Signed)
Pt states that she was in a [employer] parking lot and walking around a van and ran into another couple and tried back stepping to try to avoid walking into them and got tripped up falling backward and hitting back on van and hurting left shoulder with trying to break fall. Pt states pain is located in the center of her back.

## 2011-10-23 NOTE — ED Provider Notes (Signed)
Medical screening examination/treatment/procedure(s) were performed by non-physician practitioner and as supervising physician I was immediately available for consultation/collaboration.  Leslee Home, M.D.   Reuben Likes, MD 10/23/11 0800

## 2011-10-31 ENCOUNTER — Other Ambulatory Visit: Payer: Self-pay | Admitting: Internal Medicine

## 2011-10-31 DIAGNOSIS — Z1231 Encounter for screening mammogram for malignant neoplasm of breast: Secondary | ICD-10-CM

## 2011-12-05 ENCOUNTER — Ambulatory Visit
Admission: RE | Admit: 2011-12-05 | Discharge: 2011-12-05 | Disposition: A | Discharge status: Home / Self Care | Payer: Medicare Other | Source: Ambulatory Visit | Attending: Internal Medicine | Admitting: Internal Medicine

## 2011-12-05 DIAGNOSIS — Z1231 Encounter for screening mammogram for malignant neoplasm of breast: Secondary | ICD-10-CM

## 2012-07-13 ENCOUNTER — Encounter (HOSPITAL_COMMUNITY): Payer: Self-pay | Admitting: *Deleted

## 2012-07-13 ENCOUNTER — Inpatient Hospital Stay (HOSPITAL_COMMUNITY)
Admission: AD | Admit: 2012-07-13 | Discharge: 2012-07-17 | DRG: 378 | Disposition: A | Discharge status: Home / Self Care | Payer: Medicare Other | Source: Ambulatory Visit | Attending: Internal Medicine | Admitting: Internal Medicine

## 2012-07-13 DIAGNOSIS — J449 Chronic obstructive pulmonary disease, unspecified: Secondary | ICD-10-CM | POA: Diagnosis present

## 2012-07-13 DIAGNOSIS — D62 Acute posthemorrhagic anemia: Secondary | ICD-10-CM

## 2012-07-13 DIAGNOSIS — G2581 Restless legs syndrome: Secondary | ICD-10-CM | POA: Diagnosis present

## 2012-07-13 DIAGNOSIS — Z79899 Other long term (current) drug therapy: Secondary | ICD-10-CM

## 2012-07-13 DIAGNOSIS — N318 Other neuromuscular dysfunction of bladder: Secondary | ICD-10-CM | POA: Diagnosis present

## 2012-07-13 DIAGNOSIS — Z87891 Personal history of nicotine dependence: Secondary | ICD-10-CM

## 2012-07-13 DIAGNOSIS — E785 Hyperlipidemia, unspecified: Secondary | ICD-10-CM | POA: Diagnosis present

## 2012-07-13 DIAGNOSIS — J4489 Other specified chronic obstructive pulmonary disease: Secondary | ICD-10-CM | POA: Diagnosis present

## 2012-07-13 DIAGNOSIS — M810 Age-related osteoporosis without current pathological fracture: Secondary | ICD-10-CM | POA: Diagnosis present

## 2012-07-13 DIAGNOSIS — K219 Gastro-esophageal reflux disease without esophagitis: Secondary | ICD-10-CM | POA: Diagnosis present

## 2012-07-13 DIAGNOSIS — I10 Essential (primary) hypertension: Secondary | ICD-10-CM | POA: Diagnosis present

## 2012-07-13 DIAGNOSIS — R0609 Other forms of dyspnea: Secondary | ICD-10-CM

## 2012-07-13 DIAGNOSIS — K3182 Dieulafoy lesion (hemorrhagic) of stomach and duodenum: Principal | ICD-10-CM | POA: Diagnosis present

## 2012-07-13 DIAGNOSIS — Z7982 Long term (current) use of aspirin: Secondary | ICD-10-CM

## 2012-07-13 DIAGNOSIS — Z791 Long term (current) use of non-steroidal anti-inflammatories (NSAID): Secondary | ICD-10-CM

## 2012-07-13 LAB — PROTIME-INR
INR: 1 (ref 0.00–1.49)
Prothrombin Time: 13.1 seconds (ref 11.6–15.2)

## 2012-07-13 LAB — CBC WITH DIFFERENTIAL/PLATELET
Basophils Relative: 1 % (ref 0–1)
Eosinophils Absolute: 0.2 10*3/uL (ref 0.0–0.7)
Lymphs Abs: 1.8 10*3/uL (ref 0.7–4.0)
MCH: 31.4 pg (ref 26.0–34.0)
MCHC: 33.5 g/dL (ref 30.0–36.0)
Neutrophils Relative %: 66 % (ref 43–77)
Platelets: 157 10*3/uL (ref 150–400)
RBC: 2.1 MIL/uL — ABNORMAL LOW (ref 3.87–5.11)

## 2012-07-13 MED ORDER — ALBUTEROL SULFATE HFA 108 (90 BASE) MCG/ACT IN AERS
2.0000 | INHALATION_SPRAY | RESPIRATORY_TRACT | Status: DC | PRN
  Filled 2012-07-13: qty 6.7

## 2012-07-13 MED ORDER — BUDESONIDE-FORMOTEROL FUMARATE 80-4.5 MCG/ACT IN AERO
2.0000 | INHALATION_SPRAY | Freq: Two times a day (BID) | RESPIRATORY_TRACT | Status: DC
  Administered 2012-07-13 – 2012-07-17 (×8): 2 via RESPIRATORY_TRACT
  Filled 2012-07-13: qty 6.9

## 2012-07-13 MED ORDER — SODIUM CHLORIDE 0.9 % IV SOLN
INTRAVENOUS | Status: DC
  Administered 2012-07-13 – 2012-07-15 (×2): via INTRAVENOUS

## 2012-07-13 MED ORDER — ONDANSETRON HCL 4 MG/2ML IJ SOLN: 4.0000 mg | Freq: Four times a day (QID) | INTRAMUSCULAR | Status: DC | PRN

## 2012-07-13 MED ORDER — CHOLECALCIFEROL 10 MCG (400 UNIT) PO TABS
400.0000 [IU] | ORAL_TABLET | Freq: Every day | ORAL | Status: DC
  Administered 2012-07-14 – 2012-07-17 (×3): 400 [IU] via ORAL
  Filled 2012-07-13 (×5): qty 1

## 2012-07-13 MED ORDER — ONDANSETRON HCL 4 MG PO TABS: 4.0000 mg | ORAL_TABLET | Freq: Four times a day (QID) | ORAL | Status: DC | PRN

## 2012-07-13 MED ORDER — ACETAMINOPHEN 325 MG PO TABS
650.0000 mg | ORAL_TABLET | Freq: Once | ORAL | Status: AC
  Administered 2012-07-14: 650 mg via ORAL
  Filled 2012-07-13: qty 2

## 2012-07-13 MED ORDER — PANTOPRAZOLE SODIUM 40 MG IV SOLR
40.0000 mg | Freq: Two times a day (BID) | INTRAVENOUS | Status: DC
  Administered 2012-07-13 – 2012-07-15 (×4): 40 mg via INTRAVENOUS
  Filled 2012-07-13 (×7): qty 40

## 2012-07-13 MED ORDER — CYCLOBENZAPRINE HCL 5 MG PO TABS
7.5000 mg | ORAL_TABLET | Freq: Two times a day (BID) | ORAL | Status: DC | PRN
Start: 2012-07-13 — End: 2012-07-17
  Administered 2012-07-16: 7.5 mg via ORAL
  Filled 2012-07-13 (×2): qty 1.5

## 2012-07-13 MED ORDER — ACETAMINOPHEN 325 MG PO TABS: 650.0000 mg | ORAL_TABLET | Freq: Four times a day (QID) | ORAL | Status: DC | PRN

## 2012-07-13 MED ORDER — VITAMIN D 400 UNITS PO TABS: 400.0000 [IU] | ORAL_TABLET | Freq: Every day | ORAL | Status: DC

## 2012-07-13 MED ORDER — PROPRANOLOL HCL 10 MG PO TABS
10.0000 mg | ORAL_TABLET | Freq: Two times a day (BID) | ORAL | Status: DC
  Administered 2012-07-14 – 2012-07-17 (×6): 10 mg via ORAL
  Filled 2012-07-13 (×9): qty 1

## 2012-07-13 MED ORDER — TRAMADOL HCL 50 MG PO TABS
50.0000 mg | ORAL_TABLET | Freq: Four times a day (QID) | ORAL | Status: DC | PRN
  Administered 2012-07-13 – 2012-07-16 (×2): 50 mg via ORAL
  Filled 2012-07-13 (×2): qty 1

## 2012-07-13 MED ORDER — ACETAMINOPHEN 650 MG RE SUPP: 650.0000 mg | Freq: Four times a day (QID) | RECTAL | Status: DC | PRN

## 2012-07-13 NOTE — Progress Notes (Signed)
Received critical Hgb=6.6 from lab,patient has order to get 2 units of PRBC.Called blood bank to check if blood is ready but it's nott.Will recheck. Jeannetta Cerutti Joselita,RN

## 2012-07-13 NOTE — Progress Notes (Signed)
07/13/12 20:00 Received patient direct admit from home.Per patient,she was advised by her MD to come to the hospital because her hemoglobin was low and that she will need blood transfusion.Patient is alert and oriented x4.VSS.Family members at bedside.Notified Dr. Darcus Austin of patient's arrival.MD to place admission orders. Tyrome Donatelli Joselita,RN

## 2012-07-13 NOTE — H&P (Signed)
EUGENA RHUE is an 79 y.o. female.   Chief Complaint: sob HPI: Mayukha is  A pleasant 79 yo female who is new to our office.  She was feeling like her usual self until the last 3-4 days.  She reports increased shortness of breath.  She has had some on and off chest pains.  She denies any fever, cough, nausea , vomiting.  She has reported a few days of dark stools.  No brbpr.   She normally can walk a good distance but now she is very sob.  Her appetite is mildy reduced as well.  Her stomach has felt a bit uneasy.  Past Medical History  Diagnosis Date  . Skin cancer   . HTN (hypertension)   . Hyperlipidemia   . Hypertonicity of bladder   . Restless leg syndrome   . Osteoporosis   . GERD (gastroesophageal reflux disease)   . COPD (chronic obstructive pulmonary disease)     Past Surgical History  Procedure Laterality Date  . Skin cancer excision      Family History  Problem Relation Age of Onset  . Emphysema Brother   . Emphysema Brother   . Stroke Sister   . Hypertension Mother   . Breast cancer Sister    Social History:  reports that she quit smoking about 7 years ago. Her smoking use included Cigarettes. She has a 50 pack-year smoking history. She does not have any smokeless tobacco history on file. Her alcohol and drug histories are not on file.  Allergies: No Known Allergies  Medications Prior to Admission  Medication Sig Dispense Refill  . aspirin 81 MG tablet Take 81 mg by mouth daily.        . calcium gluconate 500 MG tablet Take 500 mg by mouth daily.        . cyclobenzaprine (FEXMID) 7.5 MG tablet Take 1 tablet (7.5 mg total) by mouth 2 (two) times daily as needed for muscle spasms.  20 tablet  0  . meloxicam (MOBIC) 7.5 MG tablet Take 1 tablet (7.5 mg total) by mouth daily.  30 tablet  0  . Naproxen Sodium (ALEVE) 220 MG CAPS Take 1 capsule by mouth daily as needed.      Marland Kitchen PROAIR HFA 108 (90 BASE) MCG/ACT inhaler USE 2 PUFFS EVERY 4 HOURS AS NEEDED FOR SHORTNESS OF  BREATH.  1 Inhaler  5  . propranolol (INDERAL) 10 MG tablet Take 10 mg by mouth 2 (two) times daily.        . traMADol (ULTRAM) 50 MG tablet Take 1 tablet (50 mg total) by mouth every 6 (six) hours as needed for pain.  15 tablet  0  . vitamin D, CHOLECALCIFEROL, 400 UNITS tablet Take 400 Units by mouth daily.        symbicort 80/4.5 2 puffs po bid  No results found for this or any previous visit (from the past 48 hour(s)). No results found.  ROS:as per the hpi  Blood pressure 105/61, pulse 89, temperature 98 F (36.7 C), temperature source Oral, resp. rate 18, height 5\' 4"  (1.626 m), weight 64.184 kg (141 lb 8 oz), SpO2 95.00%.  Age appropriate female in no acute distress.  Lungs are cta bilaterally no w/r/r.  Heart is rrr with no sig. M/r/g.  Abd soft, nt, nd, no mass or hsm.  No obvious pallor noted.  Trace Le edema bilaterally. She is alert and oriented times 4.  Grossly nL strength of upper and lower extremities.  Labs: in our office hgb is 7.0 with mcv of 98 and rbc of 2.2.  bmet was nL. cxray unremarkable. ecg with inf. q wave and lateral and inf. t wave inversion (but these t wave changes are only slightly more pronounced than she had on 2009/06/15 ecg)  Assessment/Plan 79 yo female with history copd, osteoporosis, and baseline abnormal ecg with very significant anemia with dark stools.  She apparently has been taking asa and a lot of nsaids.  I suspect she is having a subacute upper gi bleed.  We will admit,  Heme check,  Transfuse 2 uprbc.  Defer gi consultation to PCP in the a.m..  We will treat her with iv protonix as well.   We will check bnp, and cardiac enzymes and recheck ecg.  She may need cardiology eval as well depending on her symptoms and her progress.  She is full code.     Ezequiel Kayser, MD 07/13/2012, 8:30 PM

## 2012-07-14 ENCOUNTER — Encounter (HOSPITAL_COMMUNITY): Payer: Medicare Other

## 2012-07-14 ENCOUNTER — Inpatient Hospital Stay (HOSPITAL_COMMUNITY): Payer: Medicare Other

## 2012-07-14 DIAGNOSIS — D62 Acute posthemorrhagic anemia: Secondary | ICD-10-CM

## 2012-07-14 LAB — COMPREHENSIVE METABOLIC PANEL
Alkaline Phosphatase: 62 U/L (ref 39–117)
BUN: 26 mg/dL — ABNORMAL HIGH (ref 6–23)
Calcium: 8.5 mg/dL (ref 8.4–10.5)
Creatinine, Ser: 0.9 mg/dL (ref 0.50–1.10)
GFR calc Af Amer: 67 mL/min — ABNORMAL LOW (ref >90)
Glucose, Bld: 104 mg/dL — ABNORMAL HIGH (ref 70–99)
Potassium: 4.5 mEq/L (ref 3.5–5.1)
Total Protein: 5.5 g/dL — ABNORMAL LOW (ref 6.0–8.3)

## 2012-07-14 LAB — MAGNESIUM: Magnesium: 2.1 mg/dL (ref 1.5–2.5)

## 2012-07-14 LAB — BASIC METABOLIC PANEL
Calcium: 8.8 mg/dL (ref 8.4–10.5)
GFR calc Af Amer: 71 mL/min — ABNORMAL LOW (ref >90)
GFR calc non Af Amer: 62 mL/min — ABNORMAL LOW (ref >90)
Potassium: 3.6 mEq/L (ref 3.5–5.1)
Sodium: 137 mEq/L (ref 135–145)

## 2012-07-14 LAB — IRON AND TIBC
TIBC: 305 ug/dL (ref 250–470)
UIBC: 189 ug/dL (ref 125–400)

## 2012-07-14 LAB — PRO B NATRIURETIC PEPTIDE: Pro B Natriuretic peptide (BNP): 1309 pg/mL — ABNORMAL HIGH (ref 0–450)

## 2012-07-14 LAB — TRANSFERRIN: Transferrin: 238 mg/dL (ref 200–360)

## 2012-07-14 LAB — CBC
Hemoglobin: 7.8 g/dL — ABNORMAL LOW (ref 12.0–15.0)
MCH: 31.1 pg (ref 26.0–34.0)
MCHC: 34 g/dL (ref 30.0–36.0)
Platelets: 144 10*3/uL — ABNORMAL LOW (ref 150–400)
Platelets: 136 10*3/uL — ABNORMAL LOW (ref 150–400)
RBC: 2.67 MIL/uL — ABNORMAL LOW (ref 3.87–5.11)
RBC: 2.52 MIL/uL — ABNORMAL LOW (ref 3.87–5.11)
WBC: 7.1 10*3/uL (ref 4.0–10.5)

## 2012-07-14 LAB — PREPARE RBC (CROSSMATCH)

## 2012-07-14 LAB — VITAMIN B12: Vitamin B-12: 470 pg/mL (ref 211–911)

## 2012-07-14 MED ORDER — SODIUM CHLORIDE 0.9 % IV SOLN: INTRAVENOUS | Status: DC

## 2012-07-14 MED ORDER — FUROSEMIDE 10 MG/ML IJ SOLN
20.0000 mg | Freq: Once | INTRAMUSCULAR | Status: AC
  Administered 2012-07-14: 20 mg via INTRAVENOUS
  Filled 2012-07-14: qty 2

## 2012-07-14 NOTE — Consult Note (Signed)
Subjective:   HPI  The patient is an 79 year old female who 5 days ago began to experience melena. She had it for a couple of days then it cleared up but came back. She became lightheaded and dizzy and short of breath. She came to the hospital where she was found to be anemic and subsequently admitted. She is not complaining of epigastric pain, vomiting, or hematemesis. She has never had an ulcer to her knowledge.  Review of Systems Positive for dyspnea Lab Results  Component Value Date   HGB 8.3* 07/14/2012   HGB 6.6* 07/13/2012   HGB 12.9 07/29/2006   HCT 24.4* 07/14/2012   HCT 19.7* 07/13/2012   HCT 38.0 07/29/2006   ALKPHOS 62 07/13/2012   AST 19 07/13/2012   ALT 10 07/13/2012     Past Medical History  Diagnosis Date  . Skin cancer   . HTN (hypertension)   . Hyperlipidemia   . Hypertonicity of bladder   . Restless leg syndrome   . Osteoporosis   . GERD (gastroesophageal reflux disease)   . COPD (chronic obstructive pulmonary disease)    Past Surgical History  Procedure Laterality Date  . Skin cancer excision     History   Social History  . Marital Status: Single    Spouse Name: N/A    Number of Children: N/A  . Years of Education: N/A   Occupational History  . retired from [employer]    Social History Main Topics  . Smoking status: Former Smoker -- 1.00 packs/day for 50 years    Types: Cigarettes    Quit date: 01/21/2005  . Smokeless tobacco: Not on file  . Alcohol Use: Not on file  . Drug Use: Not on file  . Sexually Active: Not on file   Other Topics Concern  . Not on file   Social History Narrative  . No narrative on file   family history includes Breast cancer in her sister; Emphysema in her brothers; Hypertension in her mother; and Stroke in her sister. Current facility-administered medications:0.9 %  sodium chloride infusion, , Intravenous, Continuous, Ezequiel Kayser, MD, Last Rate: 30 mL/hr at 07/13/12 2230;  acetaminophen (TYLENOL) suppository 650 mg, 650  mg, Rectal, Q6H PRN, Ezequiel Kayser, MD;  acetaminophen (TYLENOL) tablet 650 mg, 650 mg, Oral, Q6H PRN, Ezequiel Kayser, MD albuterol (PROVENTIL HFA;VENTOLIN HFA) 108 (90 BASE) MCG/ACT inhaler 2 puff, 2 puff, Inhalation, Q4H PRN, Ezequiel Kayser, MD;  budesonide-formoterol (SYMBICORT) 80-4.5 MCG/ACT inhaler 2 puff, 2 puff, Inhalation, BID, Ezequiel Kayser, MD, 2 puff at 07/14/12 [phone];  cholecalciferol (VITAMIN D) tablet 400 Units, 400 Units, Oral, Daily, Ezequiel Kayser, MD, 400 Units at 07/14/12 1129 cyclobenzaprine (FLEXERIL) tablet 7.5 mg, 7.5 mg, Oral, BID PRN, Ezequiel Kayser, MD;  ondansetron (ZOFRAN) injection 4 mg, 4 mg, Intravenous, Q6H PRN, Ezequiel Kayser, MD;  ondansetron (ZOFRAN) tablet 4 mg, 4 mg, Oral, Q6H PRN, Ezequiel Kayser, MD;  pantoprazole (PROTONIX) injection 40 mg, 40 mg, Intravenous, Q12H, Ezequiel Kayser, MD, 40 mg at 07/14/12 1129;  propranolol (INDERAL) tablet 10 mg, 10 mg, Oral, BID, Ezequiel Kayser, MD, 10 mg at 07/14/12 1129 traMADol (ULTRAM) tablet 50 mg, 50 mg, Oral, Q6H PRN, Ezequiel Kayser, MD, 50 mg at 07/13/12 2250 No Known Allergies   Objective:     BP 97/68  Pulse 76  Temp(Src) 97.6 F (36.4 C) (Oral)  Resp 18  Ht 5\' 4"  (1.626 m)  Wt 64.184 kg (141 lb  8 oz)  BMI 24.28 kg/m2  SpO2 98%  She is alert  No acute distress  Heart regular rhythm with grade 2/6 systolic murmur  Lungs clear  Abdomen: Bowel sounds normal, soft, nontender no obvious hepatosplenomegaly  Laboratory No components found with this basename: d1      Assessment:     #1. Melena  #2. Anemia      Plan:     PPI therapy. Proceed with EGD tomorrow

## 2012-07-14 NOTE — Progress Notes (Signed)
Physician Daily Progress Note  Subjective: Admitted yesterday evening  No BMs overnight Now requiring 2L [place] given 88% on RA  No other complaints . No CP   Objective: Vital signs in last 24 hours: Temp:  [97.4 F (36.3 C)-98 F (36.7 C)] 97.4 F (36.3 C) (06/24 0700) Pulse Rate:  [71-89] 77 (06/24 0700) Resp:  [18-22] 20 (06/24 0700) BP: (90-125)/(46-67) 110/61 mmHg (06/24 0700) SpO2:  [92 %-99 %] 99 % (06/24 0645) Weight:  [141 lb 8 oz (64.184 kg)] 141 lb 8 oz (64.184 kg) (06/23 2000) Weight change:  Last BM Date: 07/12/12  CBG (last 3)  No results found for this basename: GLUCAP,  in the last 72 hours  Intake/Output from previous day:  Intake/Output Summary (Last 24 hours) at 07/14/12 0730 Last data filed at 07/14/12 0645  Gross per 24 hour  Intake   1347 ml  Output    650 ml  Net    697 ml   06/23 0701 - 06/24 0700 In: 1347 [I.V.:750; Blood:597] Out: 650 [Urine:650]  Physical Exam General appearance:WF in NAD, 2L [place] in place  Eyes: no scleral icterus Throat: oropharynx moist without erythema Resp: crackles at bases B/L, no rales or wheezes, good effort  Cardio: PVCs present, reg rate, 3/6 SEM, 2+ DP pulses  GI: soft, non-tender; bowel sounds normal; no masses,  no organomegaly Extremities: no clubbing, cyanosis or edema   Lab Results:  Recent Labs  07/13/12 2303  NA 135  K 4.5  CL 103  CO2 26  GLUCOSE 104*  BUN 26*  CREATININE 0.90  CALCIUM 8.5  MG 2.1  PHOS 3.2     Recent Labs  07/13/12 2303  AST 19  ALT 10  ALKPHOS 62  BILITOT 0.3  PROT 5.5*  ALBUMIN 3.0*     Recent Labs  07/13/12 2303  WBC 7.7  NEUTROABS 5.1  HGB 6.6*  HCT 19.7*  MCV 93.8  PLT 157    Lab Results  Component Value Date   INR 1.00 07/13/2012     Recent Labs  07/13/12 2303  CKTOTAL 89  CKMB 4.2*  TROPONINI <0.30    No results found for this basename: TSH, T4TOTAL, FREET3, T3FREE, THYROIDAB,  in the last 72 hours  No results found for this  basename: VITAMINB12, FOLATE, FERRITIN, TIBC, IRON, RETICCTPCT,  in the last 72 hours  Micro Results: No results found for this or any previous visit (from the past 240 hour(s)).  Studies/Results: No results found.   Medications: Scheduled: . budesonide-formoterol  2 puff Inhalation BID  . cholecalciferol  400 Units Oral Daily  . furosemide  20 mg Intravenous Once  . pantoprazole (PROTONIX) IV  40 mg Intravenous Q12H  . propranolol  10 mg Oral BID   Continuous: . sodium chloride 30 mL/hr at 07/13/12 2230    Assessment/Plan: Acute Blood Loss Anemia - Hgb 6.6 in setting of melena and NSAID use. She has not had EGD or colonoscopy in the past 10 years. Will heme check stool when has BM. Consult GI. Currently on liquid diet and IV PPI BID. Now s/p 2 U PRBC overnight. Recheck Hgb pending. VSS at this time. Avoding ASA, NSAIDs, blood thinners.   Elevated BNP w/ dyspnea - Saw Dr Donnie Aho about 2 years ago and was told she had murmur but everything else ok. BNP on admission 1309 ( no prior to compare ) with dyspnea, however in setting of moderate anemia. Daily weights. Giving Lasix 20mg  IV OTO this  AM in setting of 2U PRBC overnight. Checking TTE and EKG to further eval cardiac function. O2 as needed.   PAC/PVCs - on propranolol at home. Continue   COPD - symbicort and alb inh   PPx - SCDs   Dispo - home pending GI w/u and stabilization of Hgb    LOS: 1 day   Danon Lograsso 07/14/2012, 7:30 AM

## 2012-07-14 NOTE — Progress Notes (Signed)
UR COMPLETED  

## 2012-07-14 NOTE — Progress Notes (Signed)
Received call from blood bank that blood is ready. Gavyn Zoss Joselita,RN

## 2012-07-15 ENCOUNTER — Encounter (HOSPITAL_COMMUNITY): Payer: Self-pay

## 2012-07-15 ENCOUNTER — Encounter (HOSPITAL_COMMUNITY): Payer: Medicare Other

## 2012-07-15 ENCOUNTER — Encounter (HOSPITAL_COMMUNITY)
Admission: AD | Disposition: A | Discharge status: Home / Self Care | Payer: Self-pay | Source: Ambulatory Visit | Attending: Internal Medicine

## 2012-07-15 DIAGNOSIS — I359 Nonrheumatic aortic valve disorder, unspecified: Secondary | ICD-10-CM

## 2012-07-15 HISTORY — PX: ESOPHAGOGASTRODUODENOSCOPY: SHX5428

## 2012-07-15 LAB — UIFE/LIGHT CHAINS/TP QN, 24-HR UR
Alpha 2, Urine: DETECTED — AB
Free Kappa Lt Chains,Ur: 1.89 mg/dL (ref 0.14–2.42)
Free Kappa/Lambda Ratio: 18.9 ratio — ABNORMAL HIGH (ref 2.04–10.37)
Gamma Globulin, Urine: DETECTED — AB
Total Protein, Urine: 4.9 mg/dL

## 2012-07-15 LAB — CBC
HCT: 23.7 % — ABNORMAL LOW (ref 36.0–46.0)
HCT: 23.4 % — ABNORMAL LOW (ref 36.0–46.0)
Hemoglobin: 8 g/dL — ABNORMAL LOW (ref 12.0–15.0)
Hemoglobin: 7.8 g/dL — ABNORMAL LOW (ref 12.0–15.0)
MCH: 31.1 pg (ref 26.0–34.0)
MCHC: 33.3 g/dL (ref 30.0–36.0)
MCV: 91.2 fL (ref 78.0–100.0)
MCV: 93.2 fL (ref 78.0–100.0)
RDW: 17.4 % — ABNORMAL HIGH (ref 11.5–15.5)
WBC: 8.3 10*3/uL (ref 4.0–10.5)

## 2012-07-15 LAB — BASIC METABOLIC PANEL
CO2: 31 mEq/L (ref 19–32)
Chloride: 104 mEq/L (ref 96–112)
Creatinine, Ser: 0.83 mg/dL (ref 0.50–1.10)
Potassium: 4.4 mEq/L (ref 3.5–5.1)

## 2012-07-15 SURGERY — EGD (ESOPHAGOGASTRODUODENOSCOPY)
Anesthesia: Moderate Sedation

## 2012-07-15 MED ORDER — BUTAMBEN-TETRACAINE-BENZOCAINE 2-2-14 % EX AERO
INHALATION_SPRAY | CUTANEOUS | Status: DC | PRN
  Administered 2012-07-15: 1 via TOPICAL

## 2012-07-15 MED ORDER — DIPHENHYDRAMINE HCL 50 MG/ML IJ SOLN
INTRAMUSCULAR | Status: AC
  Filled 2012-07-15: qty 1

## 2012-07-15 MED ORDER — EPINEPHRINE HCL 0.1 MG/ML IJ SOSY
PREFILLED_SYRINGE | INTRAMUSCULAR | Status: AC
  Filled 2012-07-15: qty 10

## 2012-07-15 MED ORDER — BIOTENE DRY MOUTH MT LIQD
15.0000 mL | Freq: Two times a day (BID) | OROMUCOSAL | Status: DC
  Administered 2012-07-15 – 2012-07-17 (×4): 15 mL via OROMUCOSAL

## 2012-07-15 MED ORDER — MIDAZOLAM HCL 10 MG/2ML IJ SOLN
INTRAMUSCULAR | Status: DC | PRN
  Administered 2012-07-15 (×4): 1 mg via INTRAVENOUS
  Administered 2012-07-15: 2 mg via INTRAVENOUS
  Administered 2012-07-15: 1 mg via INTRAVENOUS

## 2012-07-15 MED ORDER — SODIUM CHLORIDE 0.9 % IJ SOLN
PREFILLED_SYRINGE | INTRAMUSCULAR | Status: DC | PRN
  Administered 2012-07-15: 10:00:00

## 2012-07-15 MED ORDER — GLUCAGON HCL (RDNA) 1 MG IJ SOLR
INTRAMUSCULAR | Status: AC
  Filled 2012-07-15: qty 1

## 2012-07-15 MED ORDER — FENTANYL CITRATE 0.05 MG/ML IJ SOLN
INTRAMUSCULAR | Status: DC | PRN
  Administered 2012-07-15: 25 ug via INTRAVENOUS
  Administered 2012-07-15 (×3): 12.5 ug via INTRAVENOUS

## 2012-07-15 MED ORDER — GLUCAGON HCL (RDNA) 1 MG IJ SOLR
INTRAMUSCULAR | Status: DC | PRN
  Administered 2012-07-15: .5 mg via INTRAVENOUS

## 2012-07-15 MED ORDER — MIDAZOLAM HCL 5 MG/ML IJ SOLN
INTRAMUSCULAR | Status: AC
  Filled 2012-07-15: qty 2

## 2012-07-15 MED ORDER — FENTANYL CITRATE 0.05 MG/ML IJ SOLN
INTRAMUSCULAR | Status: AC
  Filled 2012-07-15: qty 2

## 2012-07-15 NOTE — Progress Notes (Addendum)
Physician Daily Progress Note  Subjective: No events overnight  Objective: Vital signs in last 24 hours: Temp:  [97.5 F (36.4 C)-97.9 F (36.6 C)] 97.9 F (36.6 C) (06/25 0618) Pulse Rate:  [75-87] 86 (06/25 0618) Resp:  [18-20] 18 (06/25 0618) BP: (97-121)/(57-68) 121/65 mmHg (06/25 0618) SpO2:  [92 %-98 %] 92 % (06/25 0618) Weight:  [142 lb 13.7 oz (64.8 kg)] 142 lb 13.7 oz (64.8 kg) (06/24 2136) Weight change: 1 lb 5.7 oz (0.616 kg) Last BM Date: 07/14/12  CBG (last 3)  No results found for this basename: GLUCAP,  in the last 72 hours  Intake/Output from previous day:  Intake/Output Summary (Last 24 hours) at 07/15/12 0722 Last data filed at 07/15/12 1610  Gross per 24 hour  Intake   1444 ml  Output   3540 ml  Net  -2096 ml   06/24 0701 - 06/25 0700 In: 1444 [P.O.:720; I.V.:724] Out: 3540 [Urine:3540]  Physical Exam General appearance:WF in NAD, 2L [place] in place  Eyes: no scleral icterus Throat: oropharynx moist without erythema Resp: crackles at bases B/L, no rales or wheezes, good effort  Cardio: PVCs present, reg rate, 3/6 SEM, 2+ DP pulses  GI: soft, non-tender; bowel sounds normal; no masses,  no organomegaly Extremities: no clubbing, cyanosis or edema   Lab Results:  Recent Labs  07/13/12 2303 07/14/12 1045 07/15/12 0610  NA 135 137 PENDING  K 4.5 3.6 PENDING  CL 103 102 PENDING  CO2 26 30 PENDING  GLUCOSE 104* 94 94  BUN 26* 21 19  CREATININE 0.90 0.85 0.83  CALCIUM 8.5 8.8 8.5  MG 2.1  --   --   PHOS 3.2  --   --      Recent Labs  07/13/12 2303  AST 19  ALT 10  ALKPHOS 62  BILITOT 0.3  PROT 5.5*  ALBUMIN 3.0*     Recent Labs  07/13/12 2303  07/14/12 1718 07/15/12 0610  WBC 7.7  < > 7.1 8.3  NEUTROABS 5.1  --   --   --   HGB 6.6*  < > 7.8* 8.0*  HCT 19.7*  < > 23.0* 23.7*  MCV 93.8  < > 91.3 91.2  PLT 157  < > 136* 155  < > = values in this interval not displayed.  Lab Results  Component Value Date   INR 1.00  07/13/2012     Recent Labs  07/13/12 2303  CKTOTAL 89  CKMB 4.2*  TROPONINI <0.30     Recent Labs  07/13/12 2303  TSH 3.279     Recent Labs  07/13/12 2303  VITAMINB12 470  FERRITIN 31  TIBC 305  IRON 116    Micro Results: No results found for this or any previous visit (from the past 240 hour(s)).  Studies/Results: Dg Chest 2 View  07/14/2012   *RADIOLOGY REPORT*  Clinical Data: Dyspnea  CHEST - 2 VIEW  Comparison: October 10, 2009.  Findings: Cardiomediastinal silhouette appears normal.  Mild central pulmonary vascular congestion is noted with mild bilateral perihilar edema and mild associated pleural effusions.  Lower thoracic vertebral body compression deformity is noted of indeterminate age.  IMPRESSION: Mild bilateral perihilar edema with mild associated pleural effusions.   Original Report Authenticated By: Lupita Raider.,  M.D.     Medications: Scheduled: . budesonide-formoterol  2 puff Inhalation BID  . cholecalciferol  400 Units Oral Daily  . pantoprazole (PROTONIX) IV  40 mg Intravenous Q12H  . propranolol  10 mg Oral BID   Continuous: . sodium chloride 30 mL/hr at 07/13/12 2230  . sodium chloride      Assessment/Plan: Acute Blood Loss Anemia -  s/p 2 U PRBC on admission. EGD today, currently NPO. Recheck Hgb BID. Heme POS stools. PPI BID. VSS at this time. Avoding ASA, NSAIDs, blood thinners.   Elevated BNP w/ dyspnea - has f/u visit w/ Dr Donnie Aho Monday at 1045AM. BNP on admission 1309 ( no prior to compare ) with dyspnea, however in setting of moderate anemia. Currently net neg on I/Os. Recv'd Lasix 20mg  IV OTO in setting of 2U PRBC on admission. Checking TTE today to evaluate possible diagnosis of CHF. O2 as needed.   PAC/PVCs - on propranolol at home. Continue   COPD - symbicort and alb inh   PPx - SCDs   Dispo - home pending GI w/u and stabilization of Hgb    LOS: 2 days   Ceilidh Torregrossa 07/15/2012, 7:22 AM

## 2012-07-15 NOTE — Progress Notes (Signed)
Patient went for echo.

## 2012-07-15 NOTE — Op Note (Signed)
[facility] [address] [place], [address]   ENDOSCOPY PROCEDURE REPORT  PATIENT: Tanya Butler, Tanya Butler  MR#: [id number] BIRTHDATE: 10-31-1926 , 85  yrs. old GENDER: Female ENDOSCOPIST: Wandalee Ferdinand, MD REFERRED BY: PROCEDURE DATE:  07/15/2012 PROCEDURE:   EGDwith control of hemorrhage using epinephrine injection ASA CLASS: 3 INDICATIONS: melena, anemia MEDICATIONS: fentanyl 62.5 mcg IV, Versed 7 mg IV, Glucagon 0.5 mg IV TOPICAL ANESTHETIC:  Cetacaine  DESCRIPTION OF PROCEDURE:   After the risks benefits and alternatives of the procedure were thoroughly explained, informed consent was obtained.  The Pentax Gastroscope [id number]  endoscope was introduced through the mouth and advanced to the third portion of the duodenum      , limited by Without limitations.   The instrument was slowly withdrawn as the mucosa was fully examined.      FINDINGS:  Esophagus: Normal  Stomach: Normal  Duodenum: There was active bleeding coming from the region in the second portion of the duodenum from under her some folds. The specific pinpoint site could not be identified however I could see continued pooling and oozing of blood coming from under a fold. 3 cc of 1-10,000 epinephrine solution was injected into this region with good blanching of the mucosa. After this no further bleeding was seen. This appeared to control the bleeding. I suspect that this might have been a Dieulafoy lesion.  COMPLICATIONS:none  ENDOSCOPIC IMPRESSION:see above   RECOMMENDATIONS:follow clinically   REPEAT EXAM: as needed   _______________________________ Rosalie DoctorWandalee Ferdinand, MD 07/15/2012 10:11 AM

## 2012-07-15 NOTE — Progress Notes (Signed)
  Echocardiogram 2D Echocardiogram has been performed.  Cathie Beams 07/15/2012, 11:36 AM

## 2012-07-16 ENCOUNTER — Encounter (HOSPITAL_COMMUNITY): Payer: Self-pay | Admitting: Gastroenterology

## 2012-07-16 LAB — CBC
HCT: 23.3 % — ABNORMAL LOW (ref 36.0–46.0)
Hemoglobin: 7.6 g/dL — ABNORMAL LOW (ref 12.0–15.0)
MCHC: 32.6 g/dL (ref 30.0–36.0)
MCV: 94.7 fL (ref 78.0–100.0)
RDW: 17.9 % — ABNORMAL HIGH (ref 11.5–15.5)

## 2012-07-16 LAB — PROTEIN ELECTROPHORESIS, SERUM
Albumin ELP: 62.1 % (ref 55.8–66.1)
Beta 2: 4.4 % (ref 3.2–6.5)
Beta Globulin: 7.7 % — ABNORMAL HIGH (ref 4.7–7.2)
M-Spike, %: NOT DETECTED g/dL
Total Protein ELP: 5 g/dL — ABNORMAL LOW (ref 6.0–8.3)

## 2012-07-16 LAB — BASIC METABOLIC PANEL
BUN: 15 mg/dL (ref 6–23)
CO2: 30 mEq/L (ref 19–32)
Chloride: 105 mEq/L (ref 96–112)
Creatinine, Ser: 0.81 mg/dL (ref 0.50–1.10)
Glucose, Bld: 91 mg/dL (ref 70–99)
Potassium: 4.3 mEq/L (ref 3.5–5.1)

## 2012-07-16 MED ORDER — PANTOPRAZOLE SODIUM 40 MG PO TBEC
40.0000 mg | DELAYED_RELEASE_TABLET | Freq: Every day | ORAL | Status: DC
  Administered 2012-07-16 – 2012-07-17 (×2): 40 mg via ORAL
  Filled 2012-07-16 (×3): qty 1

## 2012-07-16 NOTE — Evaluation (Signed)
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  Pt c/o SOB with activity  Clinical Impression  Pt is independent in bed mobility and ambulation, but she gets SOB with decreased in O2 sats with activity and recovers quickly with rest. Recommend rollator type of walker so that she will have a seat readily available for her to rest and continued PT with HHPT at d/c .  She will benefit from intemittent assist from niece at d/c    PT Assessment  Patient needs continued PT services;All further PT needs can be met in the next venue of care    Follow Up Recommendations  Home health PT    Does the patient have the potential to tolerate intense rehabilitation      Barriers to Discharge Decreased caregiver support      Equipment Recommendations  Other (comment) (Rollator walker )    Recommendations for Other Services     Frequency Min 3X/week    Precautions / Restrictions Restrictions Weight Bearing Restrictions: No   Pertinent Vitals/Pain No c/o pain      Mobility  Bed Mobility Bed Mobility: Supine to Sit Supine to Sit: 7: Independent Transfers Transfers: Sit to Stand;Stand to Sit Details for Transfer Assistance: No difficulty with bed mobility with [facility] speed  Ambulation/Gait Ambulation/Gait Assistance: 5: Supervision Ambulation Distance (Feet): 75 Feet Assistive device: Rolling walker Ambulation/Gait Assistance Details: Pt using RW for light hand support.   Pt needs cues to slow down and use decreased speed to control dyspnea Gait Pattern: Within Functional Limits Gait velocity: Pt has tendency to go quickly and gets SOB.   General Gait Details: Pt does well with RW for balance support, but  becomes dyspneic with increased distance and/or increased speed and coincidally O2 sats drop into 79%  Sats recovered to > 90% with rest within on minute. On second attempt at walking on RA, emphasis placed to control speed and pt was able to keep sats >90%.  Pt is able to feel when shortness of breath is building and is able to use that as a cue to stop and rest Stairs: No Wheelchair Mobility Wheelchair Mobility: No    Exercises Other Exercises Other Exercises: Pt instruced to schedule her day to do short bouts of activity frequently and to stop and rest when she feels dyspnea   PT Diagnosis: Difficulty walking;Generalized weakness  PT Problem List: Decreased activity tolerance;Decreased safety awareness;Cardiopulmonary status limiting activity PT Treatment Interventions: Therapeutic activities;Therapeutic exercise;Balance training;Gait training;Patient/family education     PT Goals(Current goals can be found in the care plan section) Acute Rehab PT Goals Patient Stated Goal: to return home PT Goal Formulation: With patient Time For Goal Achievement: 07/30/12 Potential to Achieve Goals: Good  Visit Information  Last PT Received On: 07/16/12 Assistance Needed: +1 History of Present Illness: 79 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  Pt c/o SOB with activity       Prior Functioning  Home Living Family/patient expects to be discharged to:: Private residence Living Arrangements: Other relatives;Other (Comment) (neice can check on pt intermittently) Available Help at Discharge: Family Type of Home: House Home Access: Stairs to enter [employer] of Steps:  4 Entrance Stairs-Rails: Left Home Layout: One level Home Equipment: None Additional Comments: Person that pt is caregiver for is at [facility] until Monday Prior Function Level of Independence: Independent Communication Communication: No difficulties     Cognition  Cognition Arousal/Alertness: Awake/alert Behavior During Therapy: WFL for tasks assessed/performed Overall Cognitive Status: Within Functional Limits for tasks assessed    Extremity/Trunk Assessment Lower Extremity Assessment Lower Extremity Assessment: Overall WFL for tasks assessed Cervical / Trunk Assessment Cervical / Trunk Assessment: Normal   Balance Balance Balance Assessed: Yes Static Sitting Balance Static Sitting - Balance Support: No upper extremity supported Static Sitting - Level of Assistance: 7: Independent Static Standing Balance Static Standing - Balance Support: Bilateral upper extremity supported Static Standing - Level of Assistance: 6: Modified independent (Device/Increase time)  End of Session PT - End of Session Activity Tolerance: Treatment limited secondary to medical complications (Comment) Patient left: in chair;with call bell/phone within reach Nurse Communication: Mobility status  GP     Donnetta Hail 07/16/2012, 12:12 PM

## 2012-07-16 NOTE — Progress Notes (Signed)
[facility] Progress Note  Subjective: The patient has no specific complaints today. No obvious recurrence of GI bleeding since endoscopy yesterday.  Objective: Vital signs in last 24 hours: Temp:  [97.3 F (36.3 C)-98.6 F (37 C)] 98.6 F (37 C) (06/26 0447) Pulse Rate:  [80-99] 90 (06/26 0447) Resp:  [16-18] 16 (06/26 0447) BP: (101-126)/(55-70) 101/55 mmHg (06/26 0447) SpO2:  [91 %-100 %] 98 % (06/26 0830) Weight change:    PE:  She is in no distress  Abdomen soft.  Lab Results: Results for orders placed during the hospital encounter of 07/13/12 (from the past 24 hour(s))  GLUCOSE, CAPILLARY     Status: Abnormal   Collection Time    07/15/12 12:00 PM      Result Value Range   Glucose-Capillary 105 (*) 70 - 99 mg/dL  CBC     Status: Abnormal   Collection Time    07/15/12  4:40 PM      Result Value Range   WBC 8.4  4.0 - 10.5 K/uL   RBC 2.51 (*) 3.87 - 5.11 MIL/uL   Hemoglobin 7.8 (*) 12.0 - 15.0 g/dL   HCT 16.1 (*) 09.6 - 04.5 %   MCV 93.2  78.0 - 100.0 fL   MCH 31.1  26.0 - 34.0 pg   MCHC 33.3  30.0 - 36.0 g/dL   RDW 40.9 (*) 81.1 - 91.4 %   Platelets 156  150 - 400 K/uL  GLUCOSE, CAPILLARY     Status: None   Collection Time    07/15/12  5:14 PM      Result Value Range   Glucose-Capillary 78  70 - 99 mg/dL  PREPARE RBC (CROSSMATCH)     Status: None   Collection Time    07/15/12  6:30 PM      Result Value Range   Order Confirmation ORDER PROCESSED BY BLOOD BANK    BASIC METABOLIC PANEL     Status: Abnormal   Collection Time    07/16/12  6:03 AM      Result Value Range   Sodium 138  135 - 145 mEq/L   Potassium 4.3  3.5 - 5.1 mEq/L   Chloride 105  96 - 112 mEq/L   CO2 30  19 - 32 mEq/L   Glucose, Bld 91  70 - 99 mg/dL   BUN 15  6 - 23 mg/dL   Creatinine, Ser 7.82  0.50 - 1.10 mg/dL   Calcium 8.4  8.4 - 95.6 mg/dL   GFR calc non Af Amer 65 (*) >90 mL/min   GFR calc Af Amer 76 (*) >90 mL/min  CBC     Status: Abnormal   Collection Time   07/16/12  6:03 AM      Result Value Range   WBC 7.8  4.0 - 10.5 K/uL   RBC 2.46 (*) 3.87 - 5.11 MIL/uL   Hemoglobin 7.6 (*) 12.0 - 15.0 g/dL   HCT 21.3 (*) 08.6 - 57.8 %   MCV 94.7  78.0 - 100.0 fL   MCH 30.9  26.0 - 34.0 pg   MCHC 32.6  30.0 - 36.0 g/dL   RDW 46.9 (*) 62.9 - 52.8 %   Platelets 152  150 - 400 K/uL    Studies/Results: @RISRSLT24 @    Assessment: Upper GI bleed secondary to Dieulafoy lesion in the intestine  Plan: She appears to be stable at this time. Continue current management and observation. Probably home tomorrow. We will sign off. Call  us again if needed    Lyndsey Demos F 07/16/2012, 10:57 AM

## 2012-07-16 NOTE — Care Management Note (Signed)
   CARE MANAGEMENT NOTE 07/16/2012  Patient:  Tanya Butler, Tanya Butler   Account Number:  [id number]  Date Initiated:  07/16/2012  Documentation initiated by:  Shalon Councilman  Subjective/Objective Assessment:   Met with pt re HHPT     Action/Plan:   Pt given choices, and wishes to use [facility] for HHPT. [facility] notified.  Pt using walker at the hospital and will need one for home use.   Anticipated DC Date:  07/17/2012   Anticipated DC Plan:  HOME W HOME HEALTH SERVICES         [facility] Choice  DURABLE MEDICAL EQUIPMENT  HOME HEALTH   Choice offered to / List presented to:  C-1 Patient   DME arranged  Levan Hurst      DME agency  [facility]     HH arranged  HH-2 PT      [facility] agency  [facility]   Status of service:  Completed, signed off Medicare Important Message given?   (If response is "NO", the following Medicare IM given date fields will be blank) Date Medicare IM given:   Date Additional Medicare IM given:    Discharge Disposition:  HOME W HOME HEALTH SERVICES  Per UR Regulation:    If discussed at Long Length of Stay Meetings, dates discussed:    Comments:  07/16/2012 Pt selected [facility] for HHPT and walker, [facility] notified. HH services to begin 48-72 hr post d/c. Johny Shock RN MPH Case Manager [phone]

## 2012-07-16 NOTE — Progress Notes (Signed)
Patient trial without oxygen. Patient ambulated 200 feet with use of front wheel walker. The patient's oxygen saturation, without oxygen, maintained at 93-96% except for one drop for a few seconds to 88% with a quick recovery back to WNL. Patient denies SOB and breathing rate stayed WNL.

## 2012-07-16 NOTE — Progress Notes (Addendum)
Physician Daily Progress Note  Subjective: No events overnight Felt week w/ ambulation yesterday Eating and drinking ok.   No further BMs   Objective: Vital signs in last 24 hours: Temp:  [97.3 F (36.3 C)-98.6 F (37 C)] 98.6 F (37 C) (06/26 0447) Pulse Rate:  [80-99] 90 (06/26 0447) Resp:  [9-31] 16 (06/26 0447) BP: (101-140)/(51-117) 101/55 mmHg (06/26 0447) SpO2:  [91 %-100 %] 100 % (06/26 0447) Weight change:  Last BM Date: 07/14/12  CBG (last 3)   Recent Labs  07/15/12 0719 07/15/12 1200 07/15/12 1714  GLUCAP 89 105* 78    Intake/Output from previous day:  Intake/Output Summary (Last 24 hours) at 07/16/12 0735 Last data filed at 07/16/12 0035  Gross per 24 hour  Intake    300 ml  Output   1750 ml  Net  -1450 ml   06/25 0701 - 06/26 0700 In: 300 [P.O.:240; I.V.:60] Out: 1750 [Urine:1750]  Physical Exam General appearance:WF in NAD, 2L Carter in place  Eyes: no scleral icterus Throat: oropharynx moist without erythema Resp: crackles at bases B/L, no rales or wheezes, good effort  Cardio: PVCs present, reg rate, 3/6 SEM, 2+ DP pulses  GI: soft, non-tender; bowel sounds normal; no masses,  no organomegaly Extremities: no clubbing, cyanosis or edema   Lab Results:  Recent Labs  07/13/12 2303 07/14/12 1045 07/15/12 0610  NA 135 137 138  K 4.5 3.6 4.4  CL 103 102 104  CO2 26 30 31   GLUCOSE 104* 94 94  BUN 26* 21 19  CREATININE 0.90 0.85 0.83  CALCIUM 8.5 8.8 8.5  MG 2.1  --   --   PHOS 3.2  --   --      Recent Labs  07/13/12 2303  AST 19  ALT 10  ALKPHOS 62  BILITOT 0.3  PROT 5.5*  ALBUMIN 3.0*     Recent Labs  07/13/12 2303  07/15/12 1640 07/16/12 0603  WBC 7.7  < > 8.4 7.8  NEUTROABS 5.1  --   --   --   HGB 6.6*  < > 7.8* 7.6*  HCT 19.7*  < > 23.4* 23.3*  MCV 93.8  < > 93.2 94.7  PLT 157  < > 156 152  < > = values in this interval not displayed.  Lab Results  Component Value Date   INR 1.00 07/13/2012     Recent  Labs  07/13/12 2303  CKTOTAL 89  CKMB 4.2*  TROPONINI <0.30     Recent Labs  07/13/12 2303  TSH 3.279     Recent Labs  07/13/12 2303  VITAMINB12 470  FERRITIN 31  TIBC 305  IRON 116    Micro Results: No results found for this or any previous visit (from the past 240 hour(s)).  Studies/Results: Dg Chest 2 View  07/14/2012   *RADIOLOGY REPORT*  Clinical Data: Dyspnea  CHEST - 2 VIEW  Comparison: October 10, 2009.  Findings: Cardiomediastinal silhouette appears normal.  Mild central pulmonary vascular congestion is noted with mild bilateral perihilar edema and mild associated pleural effusions.  Lower thoracic vertebral body compression deformity is noted of indeterminate age.  IMPRESSION: Mild bilateral perihilar edema with mild associated pleural effusions.   Original Report Authenticated By: Lupita Raider.,  M.D.     Medications: Scheduled: . antiseptic oral rinse  15 mL Mouth Rinse BID  . budesonide-formoterol  2 puff Inhalation BID  . cholecalciferol  400 Units Oral Daily  . pantoprazole (PROTONIX)  IV  40 mg Intravenous Q12H  . propranolol  10 mg Oral BID   Continuous: . sodium chloride 30 mL/hr at 07/15/12 1310    Assessment/Plan: Acute Blood Loss Anemia -  s/p 2 U PRBC on admission. EGD showed active bleeding in second portion of duodenum that was injected w/ epi and successfully stopped the bleed. High suspicion for dieulafoy lesion. Recheck Hgb in AM and if stable, d/c home w/ daily PPI and avoidance of NSAIDs. Will hold ASA 81 for the short term until Hgb shown to recover as an outpatient  HOCM, chronic - BNP was elevated at admission. Her LVEF was at 60-65%, so no heart failure. This physiology was noted on her TTE, and given she is a new patient to my practice, the chronicity was unclear initially. After further questioning, it appears this is a chronic condition, and she already follows annually w/ Dr Donnie Aho. She is already on a BB and has f/u w/ Dr Donnie Aho  next week.   PAC/PVCs - on propranolol at home. Continue   COPD - symbicort and alb inh   PPx - SCDs   Dispo - if Hgb stable and pt ambulating on RA, d/c home tomorrow. Will order PT today to eval need for home services vs placement. Trying on RA today if needs O2 will document and order for home. Has cardiology f/u on 7/10 @ 345PM.   LOS: 3 days   Anthonette Lesage 07/16/2012, 7:35 AM

## 2012-07-17 LAB — TYPE AND SCREEN
ABO/RH(D): O NEG
Antibody Screen: NEGATIVE
Unit division: 0

## 2012-07-17 LAB — CBC
Hemoglobin: 8 g/dL — ABNORMAL LOW (ref 12.0–15.0)
MCH: 31 pg (ref 26.0–34.0)
Platelets: 179 10*3/uL (ref 150–400)
RBC: 2.58 MIL/uL — ABNORMAL LOW (ref 3.87–5.11)
WBC: 7.2 10*3/uL (ref 4.0–10.5)

## 2012-07-17 MED ORDER — PANTOPRAZOLE SODIUM 40 MG PO TBEC: 40.0000 mg | DELAYED_RELEASE_TABLET | Freq: Every day | ORAL | Status: AC

## 2012-07-17 NOTE — Progress Notes (Addendum)
Physical Therapy Treatment Patient Details Name: Tanya Butler MRN: [id number] DOB: 1926-05-01 Today's Date: 07/17/2012 Time: 7829-5621 PT Time Calculation (min): 15 min  PT Assessment / Plan / Recommendation  PT Comments   Pt is improved with ambulation tolerance today and is able to use rollator correctly and safety for support as needed  Follow Up Recommendations  HHPT     Does the patient have the potential to tolerate intense rehabilitation     Barriers to Discharge        Equipment Recommendations       Recommendations for Other Services    Frequency     Progress towards PT Goals Progress towards PT goals: Progressing toward goals  Plan Current plan remains appropriate    Precautions / Restrictions     Pertinent Vitals/Pain No c/o pain; but pt does report " restless legs"    Mobility  Transfers Transfers: Sit to Stand;Stand to Sit Sit to Stand: 6: Modified independent (Device/Increase time) Stand to Sit: 6: Modified independent (Device/Increase time) Details for Transfer Assistance: Pt instructed to use brakes on rollator and to make sure they are locked before she sit<>stand.  Pt cued to push up on armrests of chair to stand Ambulation/Gait Ambulation/Gait Assistance: 5: Supervision Ambulation Distance (Feet): 100 Feet Assistive device: Rolling walker Ambulation/Gait Assistance Details: Pt notices that 4 wheeled walker does not offer as much balance support as 2 wheeled walker. Pt cued to stand tall to lock/unlock brakes prior to sitting down Gait Pattern: Within Functional Limits Gait velocity: better controlled today General Gait Details: Pt able to use rollator for gait support and manipulate brakes safely at the right times.  O2 sats 96% with amublation on RA today with no SOB experienced Stairs: Verbally reviewed. Pt expressed understanding.  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  Pt c/o SOB with activity    Subjective Data  Pt reports she is ready to go home today   Cognition  Cognition Arousal/Alertness: Awake/alert Behavior During Therapy: WFL for tasks assessed/performed Overall Cognitive Status: Within Functional Limits for tasks assessed    Balance     End of Session PT - End of Session Activity Tolerance: Patient tolerated treatment well Patient left: in chair;with call bell/phone within reach   GP     Donnetta Hail 07/17/2012, 8:48 AM

## 2012-07-17 NOTE — Discharge Summary (Signed)
Physician Discharge Summary    Tanya Butler  MR#: [id number]  DOB:02/17/1926  Date of Admission: 07/13/2012 Date of Discharge: 07/17/2012  Attending Physician:Harbor Vanover  Patient's Tanya Butler, Tanya Butler  Consults:Treatment Team:  Graylin Shiver, MD  Discharge Diagnoses: Principal Problem:   Acute blood loss anemia   Discharge Medications:   Medication List    STOP taking these medications       aspirin EC 81 MG tablet      TAKE these medications       albuterol 108 (90 BASE) MCG/ACT inhaler  Commonly known as:  PROVENTIL HFA;VENTOLIN HFA  Inhale 2 puffs into the lungs every 4 (four) hours as needed for wheezing or shortness of breath.     budesonide-formoterol 80-4.5 MCG/ACT inhaler  Commonly known as:  SYMBICORT  Inhale 2 puffs into the lungs 2 (two) times daily.     cholecalciferol 1000 UNITS tablet  Commonly known as:  VITAMIN D  Take 1,000 Units by mouth daily.     CITRACAL + D PO  Take 1 tablet by mouth daily. Cacium 400 mg + Vitamin D (cholecalciferol) 500 I.U.     Fish Oil 1200 MG Caps  Take 1,200 mg by mouth daily.     OVER THE COUNTER MEDICATION  Take 10 mLs by mouth at bedtime as needed (for restless legs/anxiety). CALM - mix powder in warm water and drink - contains magnesium     pantoprazole 40 MG tablet  Commonly known as:  PROTONIX  Take 1 tablet (40 mg total) by mouth daily at 12 noon.     propranolol 10 MG tablet  Commonly known as:  INDERAL  Take 10 mg by mouth 2 (two) times daily.        Hospital Procedures: Dg Chest 2 View  07/14/2012   *RADIOLOGY REPORT*  Clinical Data: Dyspnea  CHEST - 2 VIEW  Comparison: October 10, 2009.  Findings: Cardiomediastinal silhouette appears normal.  Mild central pulmonary vascular congestion is noted with mild bilateral perihilar edema and mild associated pleural effusions.  Lower thoracic vertebral body compression deformity is noted of indeterminate age.  IMPRESSION: Mild bilateral perihilar  edema with mild associated pleural effusions.   Original Report Authenticated By: Lupita Raider.,  M.D.    History of Present Illness: Anemia - Pt admitted for melena w/ Hgb of 6.6. Pt was transfused 2U PRBC overnight w/ response of Hgb to 8.3. Her Hgb slowly trended down indicating active bleed. GI consulted. EGD performed showing active bleed in the duodenum which was injected and blanched w/ epi. This was presumed to be a dieulafoy lesion. Bleeding appeared controlled prior to scope withdrawal. Pts Hgb remained stable b/w 7.6-8.0 over the following 48 hours. She had 1 BM that showed no blood after the procedure. Her ASA 81 will be stopped for a few weeks while she recovers and she will have a PPI added to her drug regimen. She did suffer some deconditioning, so she will be discharged w/ HHPT and a Rolator for home strengthening.  She has f/u CBC in 1 week w/ GMA. Further work up of the anemia was unremarkable.   HOCM - BNP was elevated on admission. Her dyspnea was attributed to anemia, but needed to ensure no worsening cardiac function. 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 This may be a new condition, change. She is currently on a BB. She has f/u w/ Dr Donnie Aho  in the next 2 weeks. He is aware of her current admission.   Hospital Course: Pt's stool was heme POS. Pt received 2U of PRBC on admission w/ response to   Day of Discharge Exam BP 123/52  Pulse 86  Temp(Src) 97.9 F (36.6 C) (Oral)  Resp 20  Ht 5\' 4"  (1.626 m)  Wt 142 lb 12 oz (64.751 kg)  BMI 24.49 kg/m2  SpO2 98%  Physical Exam: General appearance:WF in NAD, 2L  in place  Eyes: no scleral icterus  Throat: oropharynx moist without erythema  Resp: crackles at bases B/L, no rales or wheezes, good effort  Cardio: PVCs present, reg rate, 3/6 SEM, 2+ DP pulses  GI: soft, non-tender; bowel sounds normal; no masses, no organomegaly   Extremities: no clubbing, cyanosis or edema   Discharge Labs:  Recent Labs  07/15/12 0610 07/16/12 0603  NA 138 138  K 4.4 4.3  CL 104 105  CO2 31 30  GLUCOSE 94 91  BUN 19 15  CREATININE 0.83 0.81  CALCIUM 8.5 8.4   No results found for this basename: AST, ALT, ALKPHOS, BILITOT, PROT, ALBUMIN,  in the last 72 hours  Recent Labs  07/16/12 0603 07/17/12 0610  WBC 7.8 7.2  HGB 7.6* 8.0*  HCT 23.3* 24.3*  MCV 94.7 94.2  PLT 152 179   Lab Results  Component Value Date   INR 1.00 07/13/2012   No results found for this basename: CKTOTAL, CKMB, CKMBINDEX, TROPONINI,  in the last 72 hours No results found for this basename: TSH, T4TOTAL, FREET3, T3FREE, THYROIDAB,  in the last 72 hours No results found for this basename: VITAMINB12, FOLATE, FERRITIN, TIBC, IRON, RETICCTPCT,  in the last 72 hours  Discharge instructions:     Discharge Orders   Future Orders Complete By Expires     Call MD for:  difficulty breathing, headache or visual disturbances  As directed     Call MD for:  extreme fatigue  As directed     Call MD for:  persistant nausea and vomiting  As directed     Diet - low sodium heart healthy  As directed     Discharge instructions  As directed     Comments:      You will be followed up for recheck CBC next week at [facility]. In will not be unusual for you to be tired during the first week after discharge, as it will take time for your blood counts to recover. We will call Monday with your appointment time. In the interim, drink plenty of water (8 cups daily) and eat a balanced diet while your blood counts recover. Please call the office or go to the ED if you develop feelings like you are going to pass out or have blood in your stools.   Home health will come to your home to help w/ physical therapy. Dr York Spaniel office will be seeing you in a few weeks as well.    Driving Restrictions  As directed     Comments:      Do not drive if you feel  tired or weak. Only drive within a 10 mile radius of your home until you see me in the office.    Increase activity slowly  As directed       01-Home or Self Care   Disposition: home w/ HHPT   Follow-up Appts: Follow-up with Dr. Link Snuffer at [facility] in 1 week. You will be called w/  appt   Condition on Discharge: stable   Tests Needing Follow-up:CBC  Time spent in discharge (includes decision making & examination of pt): 45 minutes    Signed: Dandrea Widdowson 07/17/2012, 7:48 AM

## 2012-07-21 ENCOUNTER — Inpatient Hospital Stay (HOSPITAL_COMMUNITY)
Admission: EM | Admit: 2012-07-21 | Discharge: 2012-07-27 | DRG: 378 | Disposition: A | Discharge status: Skilled Nursing Facility | Payer: Medicare Other | Attending: Internal Medicine | Admitting: Internal Medicine

## 2012-07-21 ENCOUNTER — Encounter (HOSPITAL_COMMUNITY): Payer: Self-pay | Admitting: *Deleted

## 2012-07-21 ENCOUNTER — Emergency Department (HOSPITAL_COMMUNITY): Payer: Medicare Other

## 2012-07-21 DIAGNOSIS — D62 Acute posthemorrhagic anemia: Secondary | ICD-10-CM | POA: Diagnosis present

## 2012-07-21 DIAGNOSIS — Z803 Family history of malignant neoplasm of breast: Secondary | ICD-10-CM

## 2012-07-21 DIAGNOSIS — J449 Chronic obstructive pulmonary disease, unspecified: Secondary | ICD-10-CM | POA: Diagnosis present

## 2012-07-21 DIAGNOSIS — Z9981 Dependence on supplemental oxygen: Secondary | ICD-10-CM

## 2012-07-21 DIAGNOSIS — Z8719 Personal history of other diseases of the digestive system: Secondary | ICD-10-CM

## 2012-07-21 DIAGNOSIS — G2581 Restless legs syndrome: Secondary | ICD-10-CM | POA: Diagnosis present

## 2012-07-21 DIAGNOSIS — N318 Other neuromuscular dysfunction of bladder: Secondary | ICD-10-CM | POA: Diagnosis present

## 2012-07-21 DIAGNOSIS — Z8249 Family history of ischemic heart disease and other diseases of the circulatory system: Secondary | ICD-10-CM

## 2012-07-21 DIAGNOSIS — D649 Anemia, unspecified: Secondary | ICD-10-CM

## 2012-07-21 DIAGNOSIS — R0989 Other specified symptoms and signs involving the circulatory and respiratory systems: Secondary | ICD-10-CM | POA: Diagnosis present

## 2012-07-21 DIAGNOSIS — M810 Age-related osteoporosis without current pathological fracture: Secondary | ICD-10-CM | POA: Diagnosis present

## 2012-07-21 DIAGNOSIS — I421 Obstructive hypertrophic cardiomyopathy: Secondary | ICD-10-CM | POA: Diagnosis present

## 2012-07-21 DIAGNOSIS — Z87891 Personal history of nicotine dependence: Secondary | ICD-10-CM

## 2012-07-21 DIAGNOSIS — I422 Other hypertrophic cardiomyopathy: Secondary | ICD-10-CM | POA: Diagnosis present

## 2012-07-21 DIAGNOSIS — Z823 Family history of stroke: Secondary | ICD-10-CM

## 2012-07-21 DIAGNOSIS — Z79899 Other long term (current) drug therapy: Secondary | ICD-10-CM

## 2012-07-21 DIAGNOSIS — K219 Gastro-esophageal reflux disease without esophagitis: Secondary | ICD-10-CM | POA: Diagnosis present

## 2012-07-21 DIAGNOSIS — K31811 Angiodysplasia of stomach and duodenum with bleeding: Principal | ICD-10-CM | POA: Diagnosis present

## 2012-07-21 DIAGNOSIS — K922 Gastrointestinal hemorrhage, unspecified: Secondary | ICD-10-CM | POA: Diagnosis present

## 2012-07-21 DIAGNOSIS — I10 Essential (primary) hypertension: Secondary | ICD-10-CM | POA: Diagnosis present

## 2012-07-21 DIAGNOSIS — J4489 Other specified chronic obstructive pulmonary disease: Secondary | ICD-10-CM | POA: Diagnosis present

## 2012-07-21 DIAGNOSIS — E785 Hyperlipidemia, unspecified: Secondary | ICD-10-CM | POA: Diagnosis present

## 2012-07-21 DIAGNOSIS — R5381 Other malaise: Secondary | ICD-10-CM | POA: Diagnosis not present

## 2012-07-21 DIAGNOSIS — Z836 Family history of other diseases of the respiratory system: Secondary | ICD-10-CM

## 2012-07-21 LAB — OCCULT BLOOD, POC DEVICE: Fecal Occult Bld: POSITIVE — AB

## 2012-07-21 LAB — BASIC METABOLIC PANEL
BUN: 34 mg/dL — ABNORMAL HIGH (ref 6–23)
CO2: 27 mEq/L (ref 19–32)
Chloride: 99 mEq/L (ref 96–112)
Creatinine, Ser: 1 mg/dL (ref 0.50–1.10)
GFR calc Af Amer: 59 mL/min — ABNORMAL LOW (ref >90)
Glucose, Bld: 131 mg/dL — ABNORMAL HIGH (ref 70–99)
Potassium: 4.6 mEq/L (ref 3.5–5.1)

## 2012-07-21 LAB — POCT I-STAT TROPONIN I

## 2012-07-21 LAB — CBC
HCT: 18.3 % — ABNORMAL LOW (ref 36.0–46.0)
Hemoglobin: 6 g/dL — CL (ref 12.0–15.0)
MCV: 95.3 fL (ref 78.0–100.0)
RBC: 1.92 MIL/uL — ABNORMAL LOW (ref 3.87–5.11)
WBC: 10.3 10*3/uL (ref 4.0–10.5)

## 2012-07-21 MED ORDER — ZOLPIDEM TARTRATE 5 MG PO TABS
5.0000 mg | ORAL_TABLET | Freq: Every evening | ORAL | Status: DC | PRN
  Administered 2012-07-21: 5 mg via ORAL
  Filled 2012-07-21: qty 1

## 2012-07-21 MED ORDER — ACETAMINOPHEN 325 MG PO TABS: 650.0000 mg | ORAL_TABLET | Freq: Four times a day (QID) | ORAL | Status: DC | PRN

## 2012-07-21 MED ORDER — PANTOPRAZOLE SODIUM 40 MG PO TBEC
40.0000 mg | DELAYED_RELEASE_TABLET | Freq: Every day | ORAL | Status: DC
  Administered 2012-07-23 – 2012-07-27 (×5): 40 mg via ORAL
  Filled 2012-07-21 (×5): qty 1

## 2012-07-21 MED ORDER — HYDROCODONE-ACETAMINOPHEN 5-325 MG PO TABS
1.0000 | ORAL_TABLET | ORAL | Status: DC | PRN
  Administered 2012-07-21: 1 via ORAL
  Filled 2012-07-21: qty 1

## 2012-07-21 MED ORDER — ACETAMINOPHEN 325 MG PO TABS
650.0000 mg | ORAL_TABLET | Freq: Once | ORAL | Status: AC
  Administered 2012-07-21: 650 mg via ORAL
  Filled 2012-07-21: qty 2

## 2012-07-21 MED ORDER — ONDANSETRON HCL 4 MG/2ML IJ SOLN: 4.0000 mg | Freq: Four times a day (QID) | INTRAMUSCULAR | Status: DC | PRN

## 2012-07-21 MED ORDER — ALUM & MAG HYDROXIDE-SIMETH 200-200-20 MG/5ML PO SUSP: 30.0000 mL | Freq: Four times a day (QID) | ORAL | Status: DC | PRN

## 2012-07-21 MED ORDER — ACETAMINOPHEN 650 MG RE SUPP: 650.0000 mg | Freq: Four times a day (QID) | RECTAL | Status: DC | PRN

## 2012-07-21 MED ORDER — BUDESONIDE-FORMOTEROL FUMARATE 80-4.5 MCG/ACT IN AERO
2.0000 | INHALATION_SPRAY | Freq: Two times a day (BID) | RESPIRATORY_TRACT | Status: DC
  Filled 2012-07-21: qty 6.9

## 2012-07-21 MED ORDER — ROPINIROLE HCL 0.5 MG PO TABS
0.5000 mg | ORAL_TABLET | Freq: Every day | ORAL | Status: DC
  Administered 2012-07-21 – 2012-07-26 (×6): 0.5 mg via ORAL
  Filled 2012-07-21 (×7): qty 1

## 2012-07-21 MED ORDER — SODIUM CHLORIDE 0.9 % IV SOLN
INTRAVENOUS | Status: DC
  Administered 2012-07-21: 23:00:00 via INTRAVENOUS

## 2012-07-21 MED ORDER — ONDANSETRON HCL 4 MG PO TABS: 4.0000 mg | ORAL_TABLET | Freq: Four times a day (QID) | ORAL | Status: DC | PRN

## 2012-07-21 MED ORDER — FUROSEMIDE 10 MG/ML IJ SOLN
20.0000 mg | Freq: Once | INTRAMUSCULAR | Status: AC
  Administered 2012-07-21: 20 mg via INTRAVENOUS
  Filled 2012-07-21: qty 2

## 2012-07-21 MED ORDER — ALBUTEROL SULFATE HFA 108 (90 BASE) MCG/ACT IN AERS
2.0000 | INHALATION_SPRAY | RESPIRATORY_TRACT | Status: DC | PRN
  Administered 2012-07-22: 2 via RESPIRATORY_TRACT
  Filled 2012-07-21: qty 6.7

## 2012-07-21 MED ORDER — DIPHENHYDRAMINE HCL 25 MG PO CAPS
25.0000 mg | ORAL_CAPSULE | Freq: Once | ORAL | Status: AC
  Administered 2012-07-21: 25 mg via ORAL
  Filled 2012-07-21: qty 1

## 2012-07-21 NOTE — ED Provider Notes (Signed)
History    CSN: [id number] Arrival date & time 07/21/12  1601  First MD Initiated Contact with Patient 07/21/12 1608     Chief Complaint  Patient presents with  . Chest Pain  . Shortness of Breath   (Consider location/radiation/quality/duration/timing/severity/associated sxs/prior Treatment) HPI Comments: Patient presents to ER for evaluation of chest pain and shortness of breath. Patient reports that she has been experiencing a heaviness across her chest as well as difficulty breathing today. She reports the symptoms worsen when she walks and exerts herself. She does have a history of COPD. She is oxygen dependent, uses one to 2 L at home. Her medications have not helped her today.  She reports that she was discharged from hospital last week after receiving blood transfusion secondary to GI bleeding. She reports that she has not seen any blood in her stool or dark stools. There is no abdominal pain.  Patient is a 79 y.o. female presenting with chest pain and shortness of breath.  Chest Pain Associated symptoms: shortness of breath   Associated symptoms: no abdominal pain and no fever   Shortness of Breath Associated symptoms: chest pain   Associated symptoms: no abdominal pain and no fever    Past Medical History  Diagnosis Date  . Skin cancer   . HTN (hypertension)   . Hyperlipidemia   . Restless leg syndrome   . Osteoporosis   . GERD (gastroesophageal reflux disease)   . COPD (chronic obstructive pulmonary disease)   . Hypertonicity of bladder    Past Surgical History  Procedure Laterality Date  . Skin cancer excision    . Esophagogastroduodenoscopy N/A 07/15/2012    Procedure: ESOPHAGOGASTRODUODENOSCOPY (EGD);  Surgeon: Graylin Shiver, MD;  Location: [facility] ENDOSCOPY;  Service: Endoscopy;  Laterality: N/A;   Family History  Problem Relation Age of Onset  . Emphysema Brother   . Emphysema Brother   . Stroke Sister   . Hypertension Mother   . Breast cancer Sister     History  Substance Use Topics  . Smoking status: Former Smoker -- 1.00 packs/day for 50 years    Types: Cigarettes    Quit date: 01/21/2005  . Smokeless tobacco: Not on file  . Alcohol Use: No   OB History   Grav Para Term Preterm Abortions TAB SAB Ect Mult Living                 Review of Systems  Constitutional: Negative for fever.  Respiratory: Positive for shortness of breath.   Cardiovascular: Positive for chest pain. Negative for leg swelling.  Gastrointestinal: Negative for abdominal pain and anal bleeding.  All other systems reviewed and are negative.    Allergies  Review of patient's allergies indicates no known allergies.  Home Medications   Current Outpatient Rx  Name  Route  Sig  Dispense  Refill  . albuterol (PROVENTIL HFA;VENTOLIN HFA) 108 (90 BASE) MCG/ACT inhaler   Inhalation   Inhale 2 puffs into the lungs every 4 (four) hours as needed for wheezing or shortness of breath.         . budesonide-formoterol (SYMBICORT) 80-4.5 MCG/ACT inhaler   Inhalation   Inhale 2 puffs into the lungs 2 (two) times daily.         . Calcium Citrate-Vitamin D (CITRACAL + D PO)   Oral   Take 1 tablet by mouth daily. Cacium 400 mg + Vitamin D (cholecalciferol) 500 I.U.         . cholecalciferol (VITAMIN  D) 1000 UNITS tablet   Oral   Take 1,000 Units by mouth daily.         . Omega-3 Fatty Acids (FISH OIL) 1200 MG CAPS   Oral   Take 1,200 mg by mouth daily.         Marland Kitchen OVER THE COUNTER MEDICATION   Oral   Take 10 mLs by mouth at bedtime as needed (for restless legs/anxiety). CALM - mix powder in warm water and drink - contains magnesium         . pantoprazole (PROTONIX) 40 MG tablet   Oral   Take 1 tablet (40 mg total) by mouth daily at 12 noon.         . propranolol (INDERAL) 10 MG tablet   Oral   Take 10 mg by mouth 2 (two) times daily.          Marland Kitchen EXPIRED: SYMBICORT 80-4.5 MCG/ACT inhaler      USE 2 PUFFS TWICE A DAY.   10.2 g   3     There were no vitals taken for this visit. Physical Exam  Constitutional: She is oriented to person, place, and time. She appears well-developed and well-nourished. No distress.  HENT:  Head: Normocephalic and atraumatic.  Right Ear: Hearing normal.  Left Ear: Hearing normal.  Nose: Nose normal.  Mouth/Throat: Oropharynx is clear and moist and mucous membranes are normal.  Eyes: Conjunctivae and EOM are normal. Pupils are equal, round, and reactive to light.  Neck: Normal range of motion. Neck supple.  Cardiovascular: Regular rhythm, S1 normal and S2 normal.  Exam reveals no gallop and no friction rub.   No murmur heard. Pulmonary/Chest: Accessory muscle usage present. Tachypnea noted. No respiratory distress. She has decreased breath sounds. She has no wheezes. She has no rhonchi. She has no rales. She exhibits no tenderness.  Abdominal: Soft. Normal appearance and bowel sounds are normal. There is no hepatosplenomegaly. There is no tenderness. There is no rebound, no guarding, no tenderness at McBurney's point and negative Murphy's sign. No hernia.  Genitourinary: Rectal exam shows no mass, no tenderness and anal tone normal.  Black stool  Musculoskeletal: Normal range of motion.  Neurological: She is alert and oriented to person, place, and time. She has normal strength. No cranial nerve deficit or sensory deficit. Coordination normal. GCS eye subscore is 4. GCS verbal subscore is 5. GCS motor subscore is 6.  Skin: Skin is warm, dry and intact. No rash noted. No cyanosis.  Psychiatric: She has a normal mood and affect. Her speech is normal and behavior is normal. Thought content normal.    ED Course  Procedures (including critical care time)  EKG:  Date: 07/21/2012  Rate: 70  Rhythm: normal sinus rhythm  QRS Axis: normal  Intervals: normal  ST/T Wave abnormalities: LVH with repol abn  Conduction Disutrbances:none  Narrative Interpretation:   Old EKG Reviewed:  unchanged    Labs Reviewed  CBC - Abnormal; Notable for the following:    RBC 1.92 (*)    Hemoglobin 6.0 (*)    HCT 18.3 (*)    RDW 18.1 (*)    All other components within normal limits  BASIC METABOLIC PANEL - Abnormal; Notable for the following:    Sodium 132 (*)    Glucose, Bld 131 (*)    BUN 34 (*)    GFR calc non Af Amer 51 (*)    GFR calc Af Amer 59 (*)    All other components  within normal limits  PRO B NATRIURETIC PEPTIDE - Abnormal; Notable for the following:    Pro B Natriuretic peptide (BNP) 1372.0 (*)    All other components within normal limits  OCCULT BLOOD, POC DEVICE - Abnormal; Notable for the following:    Fecal Occult Bld POSITIVE (*)    All other components within normal limits  POCT I-STAT TROPONIN I  TYPE AND SCREEN   Dg Chest 2 View  07/21/2012   *RADIOLOGY REPORT*  Clinical Data: Mid chest pressure today with shortness of breath.  CHEST - 2 VIEW  Comparison: 07/14/2012.  Findings: The heart remains normal in size.  The pulmonary vasculature and interstitial markings remain prominent.  Small bilateral pleural effusions.  Diffuse osteopenia, thoracolumbar scoliosis and thoracolumbar spine degenerative changes.  Left shoulder degenerative changes.  IMPRESSION: Stable mild changes of congestive heart failure superimposed on chronic interstitial lung disease.   Original Report Authenticated By: Beckie Salts, M.D.   Diagnosis: 1. Symptomatic anemia 2. GI bleed  MDM  Patient presents to the ER for evaluation of shortness of breath, especially dyspnea on exertion. She has a heaviness in her chest associated with symptoms. The patient was admitted last week for upper GI bleed secondary to duodenal lesion. The bleeding area was injected with epinephrine a time and PTT and patient remained stable and was discharged. She appears to have continued bleeding, however, since discharge, as her hemoglobin is now 6.0. She will require repeat hospitalization for further  management.  Patient did complain of chest discomfort as well as shortness of breath. EKGis consistent with LVH and repolarization abnormality, no evidence of ischemia or infarct. Troponin was negative.    Gilda Crease, MD 07/21/12 [phone]

## 2012-07-21 NOTE — ED Notes (Signed)
Attempted report 

## 2012-07-21 NOTE — ED Notes (Signed)
PT was recently here last week and discharge after having tear in intestines that lead to hemorrhage and she received transfusions.  Pt here with sob and chest heaviness.  Pt is 02 dependent on 1-2 liters

## 2012-07-21 NOTE — H&P (Signed)
PCP:   Alysia Penna, MD   Chief Complaint:  Shortness of breath  HPI: Tanya Butler is an 79 year old white female with a history of hypertrophic cardiomyopathy, COPD, and recent hospitalization for anemia in the setting of upper GI bleed who presented to the emergency department with increasing shortness of breath. Recently hospitalized from 6/23 through 6/27 for symptomatic anemia. She initially presented to the office with increased dyspnea on exertion and was found have a hemoglobin of 7.0. She was transfused 2 units packed red blood cells during the hospitalization with a hemoglobin of 8.0 prior to discharge.  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 Her hemoglobin remained relatively stable following this procedure and she was discharged home with home health. Since discharge she has had a progressive increase in shortness of breath associated with increased restless leg syndrome. Symptoms acutely worsened last evening and throughout the day today. Breathing is worse with exertion. No significant orthopnea. No peripheral edema. She thought that her dark stools were actually improving and has had no bright red blood per rectum or vomiting. Given her increased shortness of breath she was in the emergency department where her hemoglobin is 6.0. She will be admitted for further management and transfusion.  Review of Systems:  Review of Systems - All systems reviewed with patient and are negative except in history of present illness Past Medical History: Past Medical History  Diagnosis Date  . Skin cancer   . HTN (hypertension)   . Hyperlipidemia   . Restless leg syndrome   . Osteoporosis   . GERD (gastroesophageal reflux disease)   . COPD (chronic obstructive pulmonary disease)   . Hypertonicity of bladder       Past Surgical History  Procedure Laterality Date  . Skin cancer excision    .  Esophagogastroduodenoscopy N/A 07/15/2012    Procedure: ESOPHAGOGASTRODUODENOSCOPY (EGD);  Surgeon: Graylin Shiver, MD;  Location: [facility] ENDOSCOPY;  Service: Endoscopy;  Laterality: N/A;    Medications: Prior to Admission medications   Medication Sig Start Date End Date Taking? Authorizing Provider  budesonide-formoterol (SYMBICORT) 80-4.5 MCG/ACT inhaler Inhale 2 puffs into the lungs 2 (two) times daily.   Yes Historical Provider, MD  Calcium Citrate-Vitamin D (CITRACAL + D PO) Take 1 tablet by mouth daily. Cacium 400 mg + Vitamin D (cholecalciferol) 500 I.U.   Yes Historical Provider, MD  IRON PO Take 1 tablet by mouth daily.   Yes Historical Provider, MD  Omega-3 Fatty Acids (FISH OIL) 1200 MG CAPS Take 1,200 mg by mouth daily.   Yes Historical Provider, MD  pantoprazole (PROTONIX) 40 MG tablet Take 1 tablet (40 mg total) by mouth daily at 12 noon. 07/17/12  Yes Alysia Penna, MD  propranolol (INDERAL) 10 MG tablet Take 10 mg by mouth 2 (two) times daily.    Yes Historical Provider, MD  verapamil (CALAN-SR) 120 MG CR tablet Take 120 mg by mouth daily.   Yes Historical Provider, MD  albuterol (PROVENTIL HFA;VENTOLIN HFA) 108 (90 BASE) MCG/ACT inhaler Inhale 2 puffs into the lungs every 4 (four) hours as needed for wheezing or shortness of breath.    Historical Provider, MD    Allergies:  No Known Allergies  Social History:  reports that she quit smoking about 7 years ago. Her smoking use included Cigarettes. She has a 50 pack-year smoking history. She does not have any smokeless tobacco history on file. She reports that she does not drink  alcohol. Her drug history is not on file. she has one brother left. otherwise all are deceased. single. retired. 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 tob: quit 2008. 1PPD for long time in the past  etoh: none . social in past  drugs: none    Family History: Family History  Problem Relation Age of Onset  . Emphysema Brother   . Emphysema  Brother   . Stroke Sister   . Hypertension Mother   . Breast cancer Sister    parents - CVA > 65, MI > 60 , arthritis , HTN , skin CA  brother - CABG > 1  healthy family, most deceased from old age   Physical Exam: Filed Vitals:   07/21/12 1715 07/21/12 1730 07/21/12 1745 07/21/12 1800  BP: 120/66 125/54 140/59 127/59  Pulse: 70 71 82 72  Resp: 17 22 24 24   SpO2: 100% 100% 100% 100%   General appearance: alert and no distress Head: Normocephalic, without obvious abnormality, atraumatic Eyes: conjunctivae/corneas clear. PERRL, EOM's intact.  Nose: Nares normal. Septum midline. Mucosa normal. No drainage or sinus tenderness. Throat: lips, mucosa, and tongue normal; teeth and gums normal Neck: no adenopathy, no carotid bruit, no JVD and thyroid not enlarged, symmetric, no tenderness/mass/nodules Resp: clear to auscultation bilaterally Cardio: regular rate and rhythm GI: soft, non-tender; bowel sounds normal; no masses,  no organomegaly Extremities: extremities normal, atraumatic, no cyanosis or edema Pulses: 2+ and symmetric Lymph nodes: Cervical adenopathy: no cervical lymphadenopathy Neurologic: Alert and oriented X 3, normal strength and tone. Normal symmetric reflexes.   Labs on Admission:   Recent Labs  07/21/12 1620  NA 132*  K 4.6  CL 99  CO2 27  GLUCOSE 131*  BUN 34*  CREATININE 1.00  CALCIUM 8.6    Recent Labs  07/21/12 1620  WBC 10.3  HGB 6.0*  HCT 18.3*  MCV 95.3  PLT 206   No results found for this basename: CKTOTAL, CKMB, CKMBINDEX, TROPONINI,  in the last 72 hours Lab Results  Component Value Date   INR 1.00 07/13/2012    Radiological Exams on Admission: Dg Chest 2 View  07/21/2012   *RADIOLOGY REPORT*  Clinical Data: Mid chest pressure today with shortness of breath.  CHEST - 2 VIEW  Comparison: 07/14/2012.  Findings: The heart remains normal in size.  The pulmonary vasculature and interstitial markings remain prominent.  Small bilateral  pleural effusions.  Diffuse osteopenia, thoracolumbar scoliosis and thoracolumbar spine degenerative changes.  Left shoulder degenerative changes.  IMPRESSION: Stable mild changes of congestive heart failure superimposed on chronic interstitial lung disease.   Original Report Authenticated By: Beckie Salts, M.D.   Dg Chest 2 View  07/14/2012   *RADIOLOGY REPORT*  Clinical Data: Dyspnea  CHEST - 2 VIEW  Comparison: October 10, 2009.  Findings: Cardiomediastinal silhouette appears normal.  Mild central pulmonary vascular congestion is noted with mild bilateral perihilar edema and mild associated pleural effusions.  Lower thoracic vertebral body compression deformity is noted of indeterminate age.  IMPRESSION: Mild bilateral perihilar edema with mild associated pleural effusions.   Original Report Authenticated By: Lupita Raider.,  M.D.   Orders placed during the hospital encounter of 07/21/12  . ED EKG  . ED EKG  . EKG 12-LEAD  . EKG 12-LEAD  . EKG 12-LEAD  . EKG 12-LEAD    Assessment/Plan Principal Problem: 1. 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  Will admit for transfusion of 2 units packed red blood cells with posttransfusion hemoglobin. We'll transfuse to keep hemoglobin above 8.0. I've contacted Dr. Evette Cristal, [facility], who will see the patient in the morning for possible repeat EGD.  We'll notify him if there is any change in her condition necessitating more urgent EGD.  Clear liquid diet tonight and n.p.o. after midnight.  Active Problems: 2. RESTLESS LEG SYNDROME- likely secondary to iron deficiency. We'll try Requip and consider IV iron once she is stable. 3. DYSPNEA ON EXERTION- appears to be secondary to anemia rather than underlying heart failure. 4. Hypertrophic cardiomyopathy- we'll hold her antihypertensive therapy until she is stable and then resume.  No evidence of volume overload. Monitor volume status with transfusion. 5. Disposition- she lives alone but I think she'll be stable for discharge home once acute issues are stabilized.  PT/OT evaluation.  Wendelin Reader,W DOUGLAS 07/21/2012, 6:45 PM

## 2012-07-22 ENCOUNTER — Encounter (HOSPITAL_COMMUNITY)
Admission: EM | Disposition: A | Discharge status: Skilled Nursing Facility | Payer: Self-pay | Source: Home / Self Care | Attending: Internal Medicine

## 2012-07-22 ENCOUNTER — Encounter (HOSPITAL_COMMUNITY): Payer: Self-pay | Admitting: *Deleted

## 2012-07-22 HISTORY — PX: ESOPHAGOGASTRODUODENOSCOPY: SHX5428

## 2012-07-22 LAB — BASIC METABOLIC PANEL
CO2: 30 mEq/L (ref 19–32)
Calcium: 8.3 mg/dL — ABNORMAL LOW (ref 8.4–10.5)
Sodium: 136 mEq/L (ref 135–145)

## 2012-07-22 LAB — CBC
MCH: 31 pg (ref 26.0–34.0)
Platelets: 159 10*3/uL (ref 150–400)
RBC: 2.39 MIL/uL — ABNORMAL LOW (ref 3.87–5.11)
WBC: 8 10*3/uL (ref 4.0–10.5)

## 2012-07-22 SURGERY — EGD (ESOPHAGOGASTRODUODENOSCOPY)
Anesthesia: Moderate Sedation

## 2012-07-22 MED ORDER — FENTANYL CITRATE 0.05 MG/ML IJ SOLN
INTRAMUSCULAR | Status: DC | PRN
  Administered 2012-07-22: 25 ug via INTRAVENOUS
  Administered 2012-07-22: 12.5 ug via INTRAVENOUS

## 2012-07-22 MED ORDER — BUTAMBEN-TETRACAINE-BENZOCAINE 2-2-14 % EX AERO
INHALATION_SPRAY | CUTANEOUS | Status: DC | PRN
  Administered 2012-07-22: 1 via TOPICAL

## 2012-07-22 MED ORDER — MIDAZOLAM HCL 5 MG/ML IJ SOLN
INTRAMUSCULAR | Status: AC
  Filled 2012-07-22: qty 2

## 2012-07-22 MED ORDER — SODIUM CHLORIDE 0.9 % IV SOLN
INTRAVENOUS | Status: AC
  Administered 2012-07-22: 18:00:00 via INTRAVENOUS

## 2012-07-22 MED ORDER — GLUCAGON HCL (RDNA) 1 MG IJ SOLR
0.5000 mg | INTRAVENOUS | Status: DC | PRN
  Administered 2012-07-22: .5 mg via INTRAVENOUS

## 2012-07-22 MED ORDER — MIDAZOLAM HCL 10 MG/2ML IJ SOLN
INTRAMUSCULAR | Status: DC | PRN
  Administered 2012-07-22: 1 mg via INTRAVENOUS
  Administered 2012-07-22: 2 mg via INTRAVENOUS
  Administered 2012-07-22 (×2): 1 mg via INTRAVENOUS

## 2012-07-22 MED ORDER — FENTANYL CITRATE 0.05 MG/ML IJ SOLN
INTRAMUSCULAR | Status: AC
  Filled 2012-07-22: qty 2

## 2012-07-22 MED ORDER — SODIUM CHLORIDE 0.9 % IV SOLN: INTRAVENOUS | Status: DC

## 2012-07-22 MED ORDER — DIPHENHYDRAMINE HCL 50 MG/ML IJ SOLN
INTRAMUSCULAR | Status: AC
  Filled 2012-07-22: qty 1

## 2012-07-22 MED ORDER — GLUCAGON HCL (RDNA) 1 MG IJ SOLR
INTRAMUSCULAR | Status: AC
  Filled 2012-07-22: qty 1

## 2012-07-22 MED ORDER — BUDESONIDE-FORMOTEROL FUMARATE 80-4.5 MCG/ACT IN AERO
2.0000 | INHALATION_SPRAY | Freq: Two times a day (BID) | RESPIRATORY_TRACT | Status: DC
  Administered 2012-07-22 – 2012-07-27 (×10): 2 via RESPIRATORY_TRACT
  Filled 2012-07-22: qty 6.9

## 2012-07-22 NOTE — Progress Notes (Addendum)
Physician Daily Progress Note  Subjective: S/p 2U PRBC Having restless legs  Did not sleep well NPO this AM   Objective: Vital signs in last 24 hours: Temp:  [97.4 F (36.3 C)-98.2 F (36.8 C)] 98.1 F (36.7 C) (07/02 0642) Pulse Rate:  [70-93] 81 (07/02 0642) Resp:  [17-27] 19 (07/02 0642) BP: (98-149)/(48-90) 116/52 mmHg (07/02 0642) SpO2:  [95 %-100 %] 98 % (07/02 0642) Weight change:  Last BM Date: 07/20/12  CBG (last 3)  No results found for this basename: GLUCAP,  in the last 72 hours  Intake/Output from previous day:  Intake/Output Summary (Last 24 hours) at 07/22/12 0705 Last data filed at 07/22/12 0622  Gross per 24 hour  Intake 1587.5 ml  Output      0 ml  Net 1587.5 ml   07/01 0701 - 07/02 0700 In: 1587.5 [I.V.:738; Blood:849.5] Out: -   Physical Exam General appearance: WF in NAD  Eyes: no scleral icterus Throat: oropharynx dry without erythema Resp: diminished air flow throughout but no wheezes or rales  Cardio: RRR, 3/6 SEM, 2 DP pulses  GI: soft, non-tender; bowel sounds normal; no masses,  no organomegaly Extremities: no clubbing, cyanosis or edema   Lab Results:  Recent Labs  07/21/12 1620  NA 132*  K 4.6  CL 99  CO2 27  GLUCOSE 131*  BUN 34*  CREATININE 1.00  CALCIUM 8.6    No results found for this basename: AST, ALT, ALKPHOS, BILITOT, PROT, ALBUMIN,  in the last 72 hours   Recent Labs  07/21/12 1620  WBC 10.3  HGB 6.0*  HCT 18.3*  MCV 95.3  PLT 206    Lab Results  Component Value Date   INR 1.00 07/13/2012    No results found for this basename: CKTOTAL, CKMB, CKMBINDEX, TROPONINI,  in the last 72 hours  No results found for this basename: TSH, T4TOTAL, FREET3, T3FREE, THYROIDAB,  in the last 72 hours  No results found for this basename: VITAMINB12, FOLATE, FERRITIN, TIBC, IRON, RETICCTPCT,  in the last 72 hours  Micro Results: No results found for this or any previous visit (from the past 240  hour(s)).  Studies/Results: Dg Chest 2 View  07/21/2012   *RADIOLOGY REPORT*  Clinical Data: Mid chest pressure today with shortness of breath.  CHEST - 2 VIEW  Comparison: 07/14/2012.  Findings: The heart remains normal in size.  The pulmonary vasculature and interstitial markings remain prominent.  Small bilateral pleural effusions.  Diffuse osteopenia, thoracolumbar scoliosis and thoracolumbar spine degenerative changes.  Left shoulder degenerative changes.  IMPRESSION: Stable mild changes of congestive heart failure superimposed on chronic interstitial lung disease.   Original Report Authenticated By: Beckie Salts, M.D.     Medications: Scheduled: . budesonide-formoterol  2 puff Inhalation BID  . pantoprazole  40 mg Oral Q1200  . rOPINIRole  0.5 mg Oral QHS   Continuous: . sodium chloride 20 mL/hr at 07/21/12 2328    Assessment/Plan:  Principal Problem:   Acute blood loss anemia - s/p 2U PRBC this AM. [facility] has been contacted and will see patient this morning. She suffered from a duodenal bleed on prior admission, that is likely rebleeding. Will follow up post transfusion CBC, currently not drawn but ordered. On PO PPI daily. Still holding ASA 81 from prior admission. Currently NPO.  Active Problems:  # RESTLESS LEG SYNDROME - on requip. Iron levels were normal 2 weeks ago.    # DYSPNEA ON EXERTION - likely 2/2 to #  1 and will treat accordingly  # COPD - symbicort scheduled w/ alb prn   #  Hypertrophic cardiomyopathy - holding on propranolol 10mg  BID and verapamil 120mg  daily while BP is low in setting of anemia. Hydrating w/ NS at 75cc/hr while she is NPO. # Dispo - PT has been ordered. She had HHPT prior to admission  # PPx - PPI and SCDs     LOS: 1 day   Anjela Cassara 07/22/2012, 7:05 AM

## 2012-07-22 NOTE — Op Note (Signed)
[facility] [address] [place], [address]   ENDOSCOPY PROCEDURE REPORT  PATIENT: Chanique, Duca  MR#: [id number] BIRTHDATE: 1926/10/12 , 85  yrs. old GENDER: Female ENDOSCOPIST:Cyler Kappes Madilyn Fireman, MD REFERRED BY: PROCEDURE DATE:  07/22/2012 PROCEDURE: ASA CLASS: INDICATIONS:   recurrent GI bleeding suspected duodenal source MEDICATION:   fentanyl 37.5 mcg Versed 5 mg TOPICAL ANESTHETIC:  DESCRIPTION OF PROCEDURE:   esophagus: Normal Stomach: Normal Duodenum: Post bulbar small AVM. Bled on contact. Used argon plasma coagulator to fulgurate lesion with no subsequent bleeding     COMPLICATIONS: None  ENDOSCOPIC IMPRESSION:duodenal AVM, fulgurated successfully  RECOMMENDATIONS:advance diet and observe for further bleeding    _______________________________ Rosalie DoctorDorena Cookey, MD 07/22/2012 10:58 AM

## 2012-07-22 NOTE — Progress Notes (Signed)
OT Cancellation Note  Patient Details Name: KALIAH HADDAWAY MRN: [id number] DOB: 1926-12-28   Cancelled Treatment:    Reason Eval/Treat Not Completed: Patient at procedure or test/ unavailable -  Endoscopy.  Jeani Hawking M [phone]  07/22/2012, 2:25 PM

## 2012-07-22 NOTE — Consult Note (Signed)
Subjective:   HPI  The patient is an 79 year old female who was recently discharged from this hospital after a GI bleed. Endoscopy at that time revealed active bleeding from the second portion of the duodenum presumed to be a Dieulafoy lesion. I could not see an ulcer or any specific site of bleeding during that endoscopy, and only saw blood continuing to pool from under a fold in the second portion of the duodenum. The area was injected with epinephrine and bleeding stopped. She was observed after that in the hospital and after a few days discharge. She said her stool turned brown at home but then she started feeling weak and rundown again and thinks her stools got dark again. Her hemoglobin was found to be 6 g which was a drop from discharge. She denied vomiting blood.  Review of Systems She has been feeling weak, some dyspnea. No chest pain.  Past Medical History  Diagnosis Date  . Skin cancer   . HTN (hypertension)   . Hyperlipidemia   . Restless leg syndrome   . Osteoporosis   . GERD (gastroesophageal reflux disease)   . COPD (chronic obstructive pulmonary disease)   . Hypertonicity of bladder    Past Surgical History  Procedure Laterality Date  . Skin cancer excision    . Esophagogastroduodenoscopy N/A 07/15/2012    Procedure: ESOPHAGOGASTRODUODENOSCOPY (EGD);  Surgeon: Graylin Shiver, MD;  Location: [facility] ENDOSCOPY;  Service: Endoscopy;  Laterality: N/A;   History   Social History  . Marital Status: Single    Spouse Name: N/A    Number of Children: N/A  . Years of Education: N/A   Occupational History  . retired from [employer]    Social History Main Topics  . Smoking status: Former Smoker -- 1.00 packs/day for 50 years    Types: Cigarettes    Quit date: 01/21/2005  . Smokeless tobacco: Not on file  . Alcohol Use: No  . Drug Use: Not on file  . Sexually Active: Not on file   Other Topics Concern  . Not on file   Social History Narrative  . No narrative on file    family history includes Breast cancer in her sister; Emphysema in her brothers; Hypertension in her mother; and Stroke in her sister. Current facility-administered medications:0.9 %  sodium chloride infusion, , Intravenous, Continuous, Alysia Penna, MD;  acetaminophen (TYLENOL) suppository 650 mg, 650 mg, Rectal, Q6H PRN, W Buren Kos, MD;  acetaminophen (TYLENOL) tablet 650 mg, 650 mg, Oral, Q6H PRN, W Buren Kos, MD;  albuterol (PROVENTIL HFA;VENTOLIN HFA) 108 (90 BASE) MCG/ACT inhaler 2 puff, 2 puff, Inhalation, Q4H PRN, W Buren Kos, MD alum & mag hydroxide-simeth (MAALOX/MYLANTA) 200-200-20 MG/5ML suspension 30 mL, 30 mL, Oral, Q6H PRN, W Buren Kos, MD;  budesonide-formoterol [facility]) 80-4.5 MCG/ACT inhaler 2 puff, 2 puff, Inhalation, BID, Sharon Seller, MD;  HYDROcodone-acetaminophen (NORCO/VICODIN) 5-325 MG per tablet 1-2 tablet, 1-2 tablet, Oral, Q4H PRN, Kari Baars, MD, 1 tablet at 07/21/12 2219 ondansetron [facility]) injection 4 mg, 4 mg, Intravenous, Q6H PRN, Kari Baars, MD;  ondansetron [facility]) tablet 4 mg, 4 mg, Oral, Q6H PRN, W Buren Kos, MD;  pantoprazole (PROTONIX) EC tablet 40 mg, 40 mg, Oral, Q1200, W Buren Kos, MD;  rOPINIRole (REQUIP) tablet 0.5 mg, 0.5 mg, Oral, QHS, W Buren Kos, MD, 0.5 mg at 07/21/12 2327;  zolpidem (AMBIEN) tablet 5 mg, 5 mg, Oral, QHS PRN, Kari Baars, MD, 5 mg at 07/21/12 2359 No Known Allergies  Objective:     BP 116/52  Pulse 81  Temp(Src) 98.1 F (36.7 C) (Oral)  Resp 19  Ht 5\' 4"  (1.626 m)  Wt 64.2 kg (141 lb 8.6 oz)  BMI 24.28 kg/m2  SpO2 98%  Alert and oriented  No acute distress  Heart regular rhythm  Lungs clear  Abdomen: Bowel sounds normal, soft, nontender  Laboratory No components found with this basename: d1      Assessment:     #1. Gastrointestinal bleeding.  #2. Recurrent anemia      Plan:     Agree with transfusion of blood as needed. PPI therapy. We will proceed with EGD  this morning to further evaluate. Lab Results  Component Value Date   HGB 7.4* 07/22/2012   HGB 6.0* 07/21/2012   HGB 8.0* 07/17/2012   HCT 22.4* 07/22/2012   HCT 18.3* 07/21/2012   HCT 24.3* 07/17/2012   ALKPHOS 62 07/13/2012   AST 19 07/13/2012   ALT 10 07/13/2012

## 2012-07-22 NOTE — Evaluation (Signed)
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  Pt readmitted with c/o SOB with activity.  Another endoscopy found another ulceration which was repaired.  Clinical Impression  Pt readmitted with SOB and low HgB.;  Pt s/p endoscopy with repair.  Pt can benefit from PT to maximize Independence and safety to be able to ultimately get home.     PT Assessment  Patient needs continued PT services    Follow Up Recommendations  SNF;Other (comment) (Unless progresses back close to baseline function)    Does the patient have the potential to tolerate intense rehabilitation      Barriers to Discharge Decreased caregiver support      Equipment Recommendations  Other (comment) (needs to swap out the rollator she got from [facility])    Recommendations for Other Services     Frequency Min 3X/week    Precautions / Restrictions Precautions Precautions: Fall Restrictions Weight Bearing Restrictions: No   Pertinent Vitals/Pain sats on RA after 10 minutes at 92%, EHR  91 bpm after amb.      Mobility  Bed Mobility Bed Mobility: Supine to Sit;Sitting - Scoot to Edge of Bed Supine to Sit: With rails;5: Supervision Sitting - Scoot to Edge of Bed: 6: Modified independent (Device/Increase time) Details for Bed Mobility Assistance: safe mobility Transfers Transfers: Sit to Stand;Stand to Sit Sit to Stand: 5: Supervision;With upper extremity assist;From toilet;From bed Stand to Sit: 5: Supervision;With upper extremity assist;To chair/3-in-1;To toilet Details for Transfer Assistance: vc's for hand placement Ambulation/Gait Ambulation/Gait Assistance: 5: Supervision Ambulation Distance (Feet): 100  Feet Assistive device: Other (Comment) (pushing iv pole) Ambulation/Gait Assistance Details: generally steady gait with IV pole, but needs cues to stop and rest from dyspnea. Gait Pattern: Step-through pattern Stairs: No Wheelchair Mobility Wheelchair Mobility: No    Exercises     PT Diagnosis: Difficulty walking;Generalized weakness  PT Problem List: Decreased strength;Decreased activity tolerance;Decreased mobility PT Treatment Interventions: Gait training;Functional mobility training;Therapeutic activities;Patient/family education;DME instruction     PT Goals(Current goals can be found in the care plan section) Acute Rehab PT Goals Patient Stated Goal: To return home soon PT Goal Formulation: With patient Time For Goal Achievement: 07/30/12 Potential to Achieve Goals: Good  Visit Information  Last PT Received On: 07/22/12 Assistance Needed: +1 History of Present Illness: 79 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  Pt readmitted with c/o SOB with activity.  Another endoscopy found another ulceration which was repaired.       Prior Functioning  Home Living Family/patient expects to be discharged to:: Private residence Living Arrangements: Other relatives;Other (Comment) Available Help at Discharge: Family Type of Home: House Home Access: Stairs to enter Secretary/administrator of Steps: 4 Entrance Stairs-Rails: Left Home Layout: One level Home Equipment: None (Has new rollator that doesn't fit her--needs exchange.) Prior Function Level of Independence: Independent Communication Communication: No difficulties    Cognition  Cognition Arousal/Alertness: Awake/alert Behavior During Therapy: WFL for tasks assessed/performed Overall Cognitive Status: Within Functional Limits for tasks assessed    Extremity/Trunk Assessment Upper Extremity Assessment Upper Extremity Assessment: Generalized weakness Lower Extremity  Assessment Lower Extremity Assessment: Generalized weakness Cervical / Trunk Assessment Cervical /  Trunk Assessment: Normal   Balance Balance Balance Assessed: No (2 episodes of instability that pt self corrected.)  End of Session PT - End of Session Activity Tolerance: Patient tolerated treatment well Patient left: in chair;with call bell/phone within reach;with family/visitor present Nurse Communication: Mobility status  GP     Kayle Correa, Eliseo Gum 07/22/2012, 3:44 PM  07/22/2012  [place] Bing, PT [phone]  (pager)

## 2012-07-23 ENCOUNTER — Encounter (HOSPITAL_COMMUNITY): Payer: Self-pay | Admitting: Gastroenterology

## 2012-07-23 LAB — BASIC METABOLIC PANEL
BUN: 15 mg/dL (ref 6–23)
Chloride: 102 mEq/L (ref 96–112)
GFR calc Af Amer: 70 mL/min — ABNORMAL LOW (ref >90)
Potassium: 3.9 mEq/L (ref 3.5–5.1)

## 2012-07-23 LAB — CBC
HCT: 22.9 % — ABNORMAL LOW (ref 36.0–46.0)
Hemoglobin: 7.4 g/dL — ABNORMAL LOW (ref 12.0–15.0)
RDW: 17.2 % — ABNORMAL HIGH (ref 11.5–15.5)
WBC: 7.6 10*3/uL (ref 4.0–10.5)

## 2012-07-23 LAB — PREPARE RBC (CROSSMATCH)

## 2012-07-23 MED ORDER — PROPRANOLOL HCL 10 MG PO TABS
10.0000 mg | ORAL_TABLET | Freq: Two times a day (BID) | ORAL | Status: DC
  Administered 2012-07-23 – 2012-07-27 (×9): 10 mg via ORAL
  Filled 2012-07-23 (×10): qty 1

## 2012-07-23 NOTE — Evaluation (Signed)
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  Pt readmitted with c/o SOB with activity.  Another endoscopy found another ulceration which was repaired.   Clinical Impression   Pt presents to OT with generalized weakness.  Pt will benefit from OT for the below listed deficits,  to maximize safety and independence with BADLs.  Patient lives alone and does not have adequate support to safely discharge home alone as she requires 24 hour supervision/assist due to generalized weakness.  Recommend SNF    OT Assessment  Patient needs continued OT Services    Follow Up Recommendations  SNF    Barriers to Discharge Decreased caregiver support    Equipment Recommendations  None recommended by OT    Recommendations for Other Services    Frequency  Min 2X/week    Precautions / Restrictions Precautions Precautions: Fall Precaution Comments: cortically blind Restrictions Weight Bearing Restrictions: No       ADL  Eating/Feeding: Independent Where Assessed - Eating/Feeding: Chair Grooming: Wash/dry hands;Teeth care;Brushing hair;Supervision/safety Where Assessed - Grooming: Unsupported standing Upper Body Bathing: Set up Where Assessed - Upper Body Bathing: Supported sitting;Unsupported sitting Lower Body Bathing: Min guard Where Assessed - Lower Body Bathing: Unsupported sit to stand Upper Body Dressing: Set up Where Assessed - Upper Body Dressing: Unsupported sitting Lower Body Dressing: Min guard Where Assessed - Lower Body Dressing: Supported sit to stand Toilet Transfer: Hydrographic surveyor Method: Sit to stand;Stand pivot Acupuncturist: Comfort height  toilet Toileting - Architect and Hygiene: Supervision/safety Where Assessed - Glass blower/designer Manipulation and Hygiene: Standing Transfers/Ambulation Related to ADLs: min guard with no AD ADL Comments: Pt fatigues after toileting and grooming tasks    OT Diagnosis: Generalized weakness  OT Problem List: Decreased strength;Decreased activity tolerance;Impaired balance (sitting and/or standing) OT Treatment Interventions: Self-care/ADL training;DME and/or AE instruction;Therapeutic activities;Patient/family education;Balance training   OT Goals(Current goals can be found in the care plan section) Acute Rehab OT Goals Patient Stated Goal: To regain independence OT Goal Formulation: With patient Time For Goal Achievement: 07/30/12 Potential to Achieve Goals: Good ADL Goals Pt Will Perform Grooming: with modified independence;standing Pt Will Perform Upper Body Bathing: with modified independence;standing;sitting Pt Will Perform Lower Body Bathing: with modified independence;sit to/from stand Pt Will Perform Upper Body Dressing: with modified independence;sitting;standing Pt Will Perform Lower Body Dressing: with modified independence;sit to/from stand Pt Will Transfer to Toilet: with modified independence;regular height toilet;ambulating Pt Will Perform Toileting - Clothing Manipulation and hygiene: with modified independence;sit to/from stand  Visit Information  Last OT Received On: 07/23/12 Assistance Needed: +1 History of Present Illness: 79 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  Pt readmitted with c/o SOB with activity.  Another endoscopy found another ulceration which was repaired.       Prior Functioning     Home Living Family/patient expects to be discharged to:: Private residence Living Arrangements: Other relatives;Other (Comment) Available Help at Discharge: Family Type of Home: House Home Access:  Stairs to enter [employer] of Steps: 4 Entrance Stairs-Rails: Left Home Layout: One level Home Equipment: None (Has new rollator that doesn't fit her--needs exchange.) Prior Function Level of Independence: Independent  Communication Communication: No difficulties         Vision/Perception Vision - History Baseline Vision: No visual deficits Patient Visual Report: No change from baseline   Cognition  Cognition Arousal/Alertness: Awake/alert Behavior During Therapy: WFL for tasks assessed/performed Overall Cognitive Status: Within Functional Limits for tasks assessed    Extremity/Trunk Assessment Upper Extremity Assessment Upper Extremity Assessment: Generalized weakness (Intention tremors noted - baseline) Cervical / Trunk Assessment Cervical / Trunk Assessment: Normal     Mobility Bed Mobility Bed Mobility: Not assessed Transfers Transfers: Sit to Stand;Stand to Sit Sit to Stand: 5: Supervision;Without upper extremity assist;From chair/3-in-1;From toilet Stand to Sit: 5: Supervision;With upper extremity assist;To chair/3-in-1;To toilet     Exercise     Balance Balance Balance Assessed: Yes Dynamic Standing Balance Dynamic Standing - Balance Support: No upper extremity supported Dynamic Standing - Level of Assistance: 5: Stand by assistance Dynamic Standing - Balance Activities: Other (comment) (ADL tasks)   End of Session OT - End of Session Activity Tolerance: Patient limited by fatigue Patient left: in chair;with call bell/phone within reach  GO     Tanya Butler M 07/23/2012, 11:43 AM

## 2012-07-23 NOTE — Clinical Social Work Psychosocial (Signed)
     Clinical Social Work Department BRIEF PSYCHOSOCIAL ASSESSMENT 07/23/2012  Patient:  Tanya Butler, Tanya Butler     Account Number:  [id number]     Admit date:  07/21/2012  Clinical Social Worker:  Hulan Fray  Date/Time:  07/23/2012 09:38 AM  Referred by:  Physician  Date Referred:  07/23/2012 Referred for  SNF Placement   Other Referral:   Interview type:  Patient Other interview type:    PSYCHOSOCIAL DATA Living Status:  ALONE Admitted from facility:   Level of care:   Primary support name:  Lance Bosch (Niece) Primary support relationship to patient:  FAMILY Degree of support available:   Supportive    CURRENT CONCERNS Current Concerns  Post-Acute Placement   Other Concerns:    SOCIAL WORK ASSESSMENT / PLAN Clinical Social Worker received referral for patient needing SNF placement at discharge. CSW introduced self and explained reason for visit. Patient reported that she lives alone. CSW inquired about how she felt about SNF, and patient reported, "I'm not particulary happy, but if that's what the doctor thinks I need." CSW explained SNF placement process and patient reported that she was familiar with "a lot" of SNF's. Patient expressed interest in [facility], [facility] and [facility] SN[facility]'s. CSW will initiate SNF referral for [place]    CSW will complete FL2 for MD's signature and update patient when bed offers are made.   Assessment/plan status:  Psychosocial Support/Ongoing Assessment of Needs Other assessment/ plan:   Information/referral to community resources:   SNF packet    PATIENTS/FAMILYS RESPONSE TO PLAN OF CARE: Patient reported that she is agreeable for SNF placement at discharge and CSW to initiate referral in [facility]. Patient was appreciative of CSW's visit.

## 2012-07-23 NOTE — Clinical Social Work Placement (Addendum)
    Clinical Social Work Department CLINICAL SOCIAL WORK PLACEMENT NOTE 07/23/2012  Patient:  SHAMIAH, KAHLER  Account Number:  [id number] Admit date:  07/21/2012  Clinical Social Worker:  Hulan Fray  Date/time:  07/23/2012 10:12 AM  Clinical Social Work is seeking post-discharge placement for this patient at the following level of care:   SKILLED NURSING   (*CSW will update this form in Epic as items are completed)   07/23/2012  Patient/family provided with [facility] Department of Clinical Social Work's list of facilities offering this level of care within the geographic area requested by the patient (or if unable, by the patient's family).  07/23/2012  Patient/family informed of their freedom to choose among providers that offer the needed level of care, that participate in Medicare, Medicaid or managed care program needed by the patient, have an available bed and are willing to accept the patient.  07/23/2012  Patient/family informed of [facility]' ownership interest in [facility] as well as of the fact that they are under no obligation to receive care at this facility.  PASARR submitted to EDS on 07/23/2012 PASARR number received from [employer] on 07/23/2012  FL2 transmitted to all facilities in geographic area requested by pt/family on  07/23/2012 FL2 transmitted to all facilities within larger geographic area on   Patient informed that his/her managed care company has contracts with or will negotiate with  certain facilities, including the following:     Patient/family informed of bed offers received:  07-24-12 Patient chooses bed at [facility] SN[facility] Physician recommends and patient chooses bed at    Patient to be transferred to [facility] on 07-27-12   Patient to be transferred to facility by [employer] of family member  The following physician request were entered in Epic:   Additional Comments:

## 2012-07-23 NOTE — Progress Notes (Addendum)
Physician Daily Progress Note  Subjective: No events overnight RLS improved requip  Still fatigued  Has HA   Objective: Vital signs in last 24 hours: Temp:  [97.8 F (36.6 C)-98.4 F (36.9 C)] 98.2 F (36.8 C) (07/03 0553) Pulse Rate:  [87-100] 93 (07/03 0553) Resp:  [17-36] 18 (07/03 0553) BP: (82-129)/(41-63) 121/55 mmHg (07/03 0553) SpO2:  [86 %-100 %] 94 % (07/03 0553) Weight:  [141 lb 8.6 oz (64.2 kg)] 141 lb 8.6 oz (64.2 kg) (07/02 0833) Weight change:  Last BM Date: 07/22/12  CBG (last 3)  No results found for this basename: GLUCAP,  in the last 72 hours  Intake/Output from previous day:  Intake/Output Summary (Last 24 hours) at 07/23/12 0745 Last data filed at 07/23/12 0500  Gross per 24 hour  Intake   1205 ml  Output      0 ml  Net   1205 ml   07/02 0701 - 07/03 0700 In: 1205 [P.O.:370; I.V.:835] Out: -   Physical Exam General appearance: WF in NAD  Eyes: no scleral icterus Throat: oropharynx dry without erythema Resp: diminished air flow throughout but no wheezes or rales  Cardio: RRR, 3/6 SEM, 2 DP pulses  GI: soft, non-tender; bowel sounds normal; no masses,  no organomegaly Extremities: no clubbing, cyanosis or edema   Lab Results:  Recent Labs  07/22/12 0737 07/23/12 0520  NA 136 135  K 3.6 3.9  CL 102 102  CO2 30 27  GLUCOSE 91 90  BUN 22 15  CREATININE 0.94 0.86  CALCIUM 8.3* 8.0*    No results found for this basename: AST, ALT, ALKPHOS, BILITOT, PROT, ALBUMIN,  in the last 72 hours   Recent Labs  07/22/12 0737 07/23/12 0520  WBC 8.0 7.6  HGB 7.4* 7.4*  HCT 22.4* 22.9*  MCV 93.7 95.4  PLT 159 175    Lab Results  Component Value Date   INR 1.00 07/13/2012    No results found for this basename: CKTOTAL, CKMB, CKMBINDEX, TROPONINI,  in the last 72 hours  No results found for this basename: TSH, T4TOTAL, FREET3, T3FREE, THYROIDAB,  in the last 72 hours  No results found for this basename: VITAMINB12, FOLATE, FERRITIN,  TIBC, IRON, RETICCTPCT,  in the last 72 hours  Micro Results: No results found for this or any previous visit (from the past 240 hour(s)).  Studies/Results: Dg Chest 2 View  07/21/2012   *RADIOLOGY REPORT*  Clinical Data: Mid chest pressure today with shortness of breath.  CHEST - 2 VIEW  Comparison: 07/14/2012.  Findings: The heart remains normal in size.  The pulmonary vasculature and interstitial markings remain prominent.  Small bilateral pleural effusions.  Diffuse osteopenia, thoracolumbar scoliosis and thoracolumbar spine degenerative changes.  Left shoulder degenerative changes.  IMPRESSION: Stable mild changes of congestive heart failure superimposed on chronic interstitial lung disease.   Original Report Authenticated By: Beckie Salts, M.D.     Medications: Scheduled: . budesonide-formoterol  2 puff Inhalation BID  . pantoprazole  40 mg Oral Q1200  . propranolol  10 mg Oral BID  . rOPINIRole  0.5 mg Oral QHS   Continuous:    Assessment/Plan:  Principal Problem:   Acute blood loss anemia - s/p 2U PRBC on admission. Hgb remains stable, but below goal of 8. EGD performed that found AVM that was appropriately treated on 07/22/12.  Advancing diet as tolerated. Watching daily Hgb. Transfuse 1U PRBC today given stable but Hgb < 8. Watch post transfusion CBC for  Hgb.   Active Problems:  # RESTLESS LEG SYNDROME - on requip. Iron levels were normal 2 weeks ago.  Will add on iron panel to AM labs given she is having persistent GI bleed   # DYSPNEA ON EXERTION - improving. likely 2/2 to #1 and will treat accordingly  # COPD - symbicort scheduled w/ alb prn   #  Hypertrophic cardiomyopathy -  Resuming propranolol 10mg  BID as her BPs are stabilizing after transfusion. Holding on resuming home verapamil 120mg  daily until Friday, when can resume if BP ok on propranolol.  # Dispo - PT rec'd SNF. Pt informed. Social work consult placed. D/C in 2 days if Hgb and BP remains stable   # PPx - PPI  and SCDs     LOS: 2 days   Rivkah Wolz 07/23/2012, 7:45 AM

## 2012-07-23 NOTE — Progress Notes (Signed)
Physical Therapy Treatment Patient Details Name: Tanya Butler MRN: [id number] DOB: [birth year] Today's Date: 07/23/2012 Time: 4098-1191 PT Time Calculation (min): 18 min  PT Assessment / Plan / Recommendation  PT Comments   Pt improving daily; continues to be guarded and mildly unsteady without assistive device.  Has a rollator at home that might be useful if it fit her size.  Will try to get it exchanged.   Follow Up Recommendations  SNF;Other (comment) (Unless progresses back close to baseline function)     Does the patient have the potential to tolerate intense rehabilitation     Barriers to Discharge        Equipment Recommendations  Other (comment) (needs to swap out the rollator she got from [facility])    Recommendations for Other Services    Frequency Min 3X/week   Progress towards PT Goals    Plan Current plan remains appropriate    Precautions / Restrictions Precautions Precautions: Fall Restrictions Weight Bearing Restrictions: No   Pertinent Vitals/Pain     Mobility  Bed Mobility Bed Mobility: Not assessed Transfers Transfers: Sit to Stand;Stand to Sit Sit to Stand: 5: Supervision;With upper extremity assist;From chair/3-in-1;From toilet Stand to Sit: 5: Supervision;With upper extremity assist;To chair/3-in-1;To toilet Details for Transfer Assistance: generally safe mobility Ambulation/Gait Ambulation/Gait Assistance: 4: Min guard Ambulation Distance (Feet): 100 Feet (times 2) Assistive device: None ( ) Ambulation/Gait Assistance Details: more guarded and weak kneed gait when using no assist device.  Pt was somewhat wary to scan her environment and was guarded, but did well with encouragement and even turned fully around to look behind her on one occaision Gait Pattern: Step-through pattern Stairs: No Wheelchair Mobility Wheelchair Mobility: No    Exercises     PT Diagnosis:    PT Problem List:   PT Treatment Interventions:     PT Goals  (current goals can now be found in the care plan section) Acute Rehab PT Goals Patient Stated Goal: To return home soon PT Goal Formulation: With patient Time For Goal Achievement: 07/30/12 Potential to Achieve Goals: Good  Visit Information  Last PT Received On: 07/23/12 History of Present Illness: 79 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  Pt readmitted with c/o SOB with activity.  Another endoscopy found another ulceration which was repaired.    Subjective Data  Subjective: you think I can make it that far? Patient Stated Goal: To return home soon   Cognition  Cognition Arousal/Alertness: Awake/alert Behavior During Therapy: WFL for tasks assessed/performed Overall Cognitive Status: Within Functional Limits for tasks assessed    Balance  Balance Balance Assessed: No (2 episodes of instability that pt self corrected.)  End of Session PT - End of Session Activity Tolerance: Patient tolerated treatment well Patient left: in chair;with call bell/phone within reach;with family/visitor present Nurse Communication: Mobility status   GP     Deepa Barthel, Eliseo Gum 07/23/2012, 4:50 PM

## 2012-07-24 LAB — TYPE AND SCREEN
ABO/RH(D): O NEG
Antibody Screen: NEGATIVE
Unit division: 0
Unit division: 0

## 2012-07-24 LAB — BASIC METABOLIC PANEL
BUN: 9 mg/dL (ref 6–23)
Chloride: 105 mEq/L (ref 96–112)
Creatinine, Ser: 0.74 mg/dL (ref 0.50–1.10)
Glucose, Bld: 93 mg/dL (ref 70–99)
Potassium: 4 mEq/L (ref 3.5–5.1)

## 2012-07-24 LAB — IRON AND TIBC
Saturation Ratios: 12 % — ABNORMAL LOW (ref 20–55)
UIBC: 290 ug/dL (ref 125–400)

## 2012-07-24 LAB — CBC
HCT: 27.2 % — ABNORMAL LOW (ref 36.0–46.0)
Hemoglobin: 8.8 g/dL — ABNORMAL LOW (ref 12.0–15.0)
MCV: 93.5 fL (ref 78.0–100.0)
RDW: 17 % — ABNORMAL HIGH (ref 11.5–15.5)
WBC: 6.7 10*3/uL (ref 4.0–10.5)

## 2012-07-24 MED ORDER — VERAPAMIL HCL ER 120 MG PO TBCR
120.0000 mg | EXTENDED_RELEASE_TABLET | Freq: Every day | ORAL | Status: DC
  Administered 2012-07-24 – 2012-07-27 (×4): 120 mg via ORAL
  Filled 2012-07-24 (×4): qty 1

## 2012-07-24 MED ORDER — NYSTATIN 100000 UNIT/ML MT SUSP
5.0000 mL | Freq: Four times a day (QID) | OROMUCOSAL | Status: DC
  Administered 2012-07-24 – 2012-07-27 (×11): 500000 [IU] via ORAL
  Filled 2012-07-24 (×15): qty 5

## 2012-07-24 MED ORDER — POLYSACCHARIDE IRON COMPLEX 150 MG PO CAPS
150.0000 mg | ORAL_CAPSULE | Freq: Every day | ORAL | Status: DC
  Administered 2012-07-24 – 2012-07-27 (×4): 150 mg via ORAL
  Filled 2012-07-24 (×4): qty 1

## 2012-07-24 NOTE — Clinical Social Work Note (Signed)
Clinical Social Worker followed up with patient and she reported that she wants to confirm [facility]. CSW spoke with admissions coordinator and she will follow up with CSW Monday 07-27-12, regarding her availability at that time.   Rozetta Nunnery MSW, [employer] [phone]

## 2012-07-24 NOTE — Progress Notes (Signed)
Physical Therapy Treatment Patient Details Name: CASSY SPROWL MRN: [id number] DOB: 03-Jan-1927 Today's Date: 07/24/2012 Time: 4098-1191 PT Time Calculation (min): 13 min  PT Assessment / Plan / Recommendation  PT Comments   Pt making steady progress.  Follow Up Recommendations  SNF     Does the patient have the potential to tolerate intense rehabilitation     Barriers to Discharge        Equipment Recommendations  None recommended by PT    Recommendations for Other Services    Frequency Min 3X/week   Progress towards PT Goals Progress towards PT goals: Progressing toward goals  Plan Current plan remains appropriate    Precautions / Restrictions Precautions Precautions: Fall Precaution Comments: cortically blind Restrictions Weight Bearing Restrictions: No   Pertinent Vitals/Pain See flowsheet.    Mobility  Transfers Sit to Stand: 5: Supervision;With upper extremity assist;From chair/3-in-1;From toilet Stand to Sit: 5: Supervision;With upper extremity assist;To chair/3-in-1;To toilet Ambulation/Gait Ambulation/Gait Assistance: 4: Min guard Ambulation Distance (Feet): 200 Feet Assistive device: None Ambulation/Gait Assistance Details: Slightly unsteady and tremulous. 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  Pt readmitted with c/o SOB with activity.  Another endoscopy found another ulceration which was repaired.    Subjective Data      Cognition  Cognition Arousal/Alertness: Awake/alert Behavior During Therapy: WFL for tasks assessed/performed Overall Cognitive  Status: Within Functional Limits for tasks assessed    Balance  Static Standing Balance Static Standing - Balance Support: No upper extremity supported Static Standing - Level of Assistance: 5: Stand by assistance  End of Session PT - End of Session Activity Tolerance: Patient tolerated treatment well Patient left: in chair;with call bell/phone within reach;with family/visitor present   GP     [facility] 07/24/2012, 2:56 PM  [facility] PT [phone]

## 2012-07-24 NOTE — Progress Notes (Signed)
Subjective: She had a mild headache last night, not now. Breathing is improved after transfusion, but she still feels somewhat weak. She would like to go to the [place] [facility] if it is available.   Objective: Vital signs in last 24 hours: Temp:  [97.7 F (36.5 C)-99 F (37.2 C)] 98.1 F (36.7 C) (07/04 0601) Pulse Rate:  [79-94] 81 (07/04 1126) Resp:  [16-24] 18 (07/04 1126) BP: (97-133)/(51-64) 124/54 mmHg (07/04 1126) SpO2:  [94 %-100 %] 100 % (07/04 1126) Weight change:    Intake/Output from previous day: 07/03 0701 - 07/04 0700 In: 776 [I.V.:500; Blood:276] Out: -    General appearance: alert, cooperative and no distress Resp: clear to auscultation bilaterally Cardio: regular rate and rhythm and with a 2/6 SEM GI: soft, non-tender; bowel sounds normal; no masses,  no organomegaly Extremities: extremities normal, atraumatic, no cyanosis or edema  Lab Results:  Recent Labs  07/23/12 0520 07/24/12 0545  WBC 7.6 6.7  HGB 7.4* 8.8*  HCT 22.9* 27.2*  PLT 175 199   BMET  Recent Labs  07/23/12 0520 07/24/12 0545  NA 135 138  K 3.9 4.0  CL 102 105  CO2 27 29  GLUCOSE 90 93  BUN 15 9  CREATININE 0.86 0.74  CALCIUM 8.0* 8.5   CMET CMP     Component Value Date/Time   NA 138 07/24/2012 0545   K 4.0 07/24/2012 0545   CL 105 07/24/2012 0545   CO2 29 07/24/2012 0545   GLUCOSE 93 07/24/2012 0545   BUN 9 07/24/2012 0545   CREATININE 0.74 07/24/2012 0545   CALCIUM 8.5 07/24/2012 0545   PROT 5.5* 07/13/2012 2303   ALBUMIN 3.0* 07/13/2012 2303   AST 19 07/13/2012 2303   ALT 10 07/13/2012 2303   ALKPHOS 62 07/13/2012 2303   BILITOT 0.3 07/13/2012 2303   GFRNONAA 77* 07/24/2012 0545   GFRAA 89* 07/24/2012 0545    CBG (last 3)  No results found for this basename: GLUCAP,  in the last 72 hours  INR RESULTS:   Lab Results  Component Value Date   INR 1.00 07/13/2012     Studies/Results: No results found.  Medications: I have reviewed the patient's current  medications.  Assessment/Plan: #1 Anemia: improved after transfusion and we will make sure that she is on supplemental iron. #2 Hypertrophic Cardiomyopathy: stable and we will restart verapamil #3 Sore Throat: will start Nystatin for this.    LOS: 3 days   Starr Engel G 07/24/2012, 11:58 AM

## 2012-07-25 LAB — BASIC METABOLIC PANEL
BUN: 10 mg/dL (ref 6–23)
CO2: 26 mEq/L (ref 19–32)
Chloride: 104 mEq/L (ref 96–112)
GFR calc non Af Amer: 77 mL/min — ABNORMAL LOW (ref >90)
Glucose, Bld: 92 mg/dL (ref 70–99)
Potassium: 4.2 mEq/L (ref 3.5–5.1)
Sodium: 137 mEq/L (ref 135–145)

## 2012-07-25 LAB — CBC
HCT: 29.1 % — ABNORMAL LOW (ref 36.0–46.0)
Hemoglobin: 9.3 g/dL — ABNORMAL LOW (ref 12.0–15.0)
RBC: 3.06 MIL/uL — ABNORMAL LOW (ref 3.87–5.11)
WBC: 6.2 10*3/uL (ref 4.0–10.5)

## 2012-07-25 NOTE — Progress Notes (Signed)
Subjective: Currently, she is napping in her chair and appears comfortable. She has been eating and drinking well, and is weak with ambulation with PT staff.  Objective: Vital signs in last 24 hours: Temp:  [97.7 F (36.5 C)-97.8 F (36.6 C)] 97.7 F (36.5 C) (07/05 0458) Pulse Rate:  [71-81] 72 (07/05 0458) Resp:  [15-18] 16 (07/05 0458) BP: (110-124)/(50-82) 118/82 mmHg (07/05 0458) SpO2:  [94 %-100 %] 97 % (07/05 0847) Weight change:    Intake/Output from previous day: 07/04 0701 - 07/05 0700 In: 480 [P.O.:480] Out: -    General appearance: no distress Resp: clear to auscultation bilaterally Cardio: regular rate and rhythm and with a 2/6 SEM GI: soft, non-tender; bowel sounds normal; no masses,  no organomegaly Extremities: extremities normal, atraumatic, no cyanosis or edema  Lab Results:  Recent Labs  07/24/12 0545 07/25/12 0557  WBC 6.7 6.2  HGB 8.8* 9.3*  HCT 27.2* 29.1*  PLT 199 207   BMET  Recent Labs  07/24/12 0545 07/25/12 0557  NA 138 137  K 4.0 4.2  CL 105 104  CO2 29 26  GLUCOSE 93 92  BUN 9 10  CREATININE 0.74 0.73  CALCIUM 8.5 8.4   CMET CMP     Component Value Date/Time   NA 137 07/25/2012 0557   K 4.2 07/25/2012 0557   CL 104 07/25/2012 0557   CO2 26 07/25/2012 0557   GLUCOSE 92 07/25/2012 0557   BUN 10 07/25/2012 0557   CREATININE 0.73 07/25/2012 0557   CALCIUM 8.4 07/25/2012 0557   PROT 5.5* 07/13/2012 2303   ALBUMIN 3.0* 07/13/2012 2303   AST 19 07/13/2012 2303   ALT 10 07/13/2012 2303   ALKPHOS 62 07/13/2012 2303   BILITOT 0.3 07/13/2012 2303   GFRNONAA 77* 07/25/2012 0557   GFRAA 89* 07/25/2012 0557    CBG (last 3)  No results found for this basename: GLUCAP,  in the last 72 hours  INR RESULTS:   Lab Results  Component Value Date   INR 1.00 07/13/2012     Studies/Results: No results found.  Medications: I have reviewed the patient's current medications.  Assessment/Plan: #1 Anemia: stable after transfusion of PRBC and on iron  supplement. #2 Hypertrophic cardiomyopathy: stable with the addition of calan to her regimen yesterday #3 Physical Deconditioning: moderate and plan is for transfer to a SNF in 2 days. Will sign FL-2 form.   LOS: 4 days   Osker Ayoub G 07/25/2012, 10:53 AM

## 2012-07-26 LAB — CBC
HCT: 27.8 % — ABNORMAL LOW (ref 36.0–46.0)
Hemoglobin: 8.9 g/dL — ABNORMAL LOW (ref 12.0–15.0)
MCH: 30.3 pg (ref 26.0–34.0)
RBC: 2.94 MIL/uL — ABNORMAL LOW (ref 3.87–5.11)

## 2012-07-26 LAB — BASIC METABOLIC PANEL
BUN: 14 mg/dL (ref 6–23)
CO2: 28 mEq/L (ref 19–32)
Calcium: 8.6 mg/dL (ref 8.4–10.5)
Glucose, Bld: 89 mg/dL (ref 70–99)
Sodium: 136 mEq/L (ref 135–145)

## 2012-07-26 NOTE — Progress Notes (Signed)
Subjective: Her strength is improving and she is not having dyspnea on nasal cannula oxygen, appetite is good.  Objective: Vital signs in last 24 hours: Temp:  [97.5 F (36.4 C)-98 F (36.7 C)] 97.5 F (36.4 C) (07/06 0505) Pulse Rate:  [69-80] 77 (07/06 0505) Resp:  [16-18] 18 (07/06 0505) BP: (101-132)/(43-68) 132/68 mmHg (07/06 0505) SpO2:  [95 %-99 %] 97 % (07/06 0735) Weight change:    Intake/Output from previous day: 07/05 0701 - 07/06 0700 In: 1090 [P.O.:1090] Out: -    General appearance: alert, cooperative and no distress Resp: clear to auscultation bilaterally Cardio: regular rate and rhythm and with a 3/6 SEM GI: soft, non-tender; bowel sounds normal; no masses,  no organomegaly Extremities: extremities normal, atraumatic, no cyanosis or edema  Lab Results:  Recent Labs  07/25/12 0557 07/26/12 0445  WBC 6.2 6.2  HGB 9.3* 8.9*  HCT 29.1* 27.8*  PLT 207 216   BMET  Recent Labs  07/25/12 0557 07/26/12 0445  NA 137 136  K 4.2 4.4  CL 104 104  CO2 26 28  GLUCOSE 92 89  BUN 10 14  CREATININE 0.73 0.68  CALCIUM 8.4 8.6   CMET CMP     Component Value Date/Time   NA 136 07/26/2012 0445   K 4.4 07/26/2012 0445   CL 104 07/26/2012 0445   CO2 28 07/26/2012 0445   GLUCOSE 89 07/26/2012 0445   BUN 14 07/26/2012 0445   CREATININE 0.68 07/26/2012 0445   CALCIUM 8.6 07/26/2012 0445   PROT 5.5* 07/13/2012 2303   ALBUMIN 3.0* 07/13/2012 2303   AST 19 07/13/2012 2303   ALT 10 07/13/2012 2303   ALKPHOS 62 07/13/2012 2303   BILITOT 0.3 07/13/2012 2303   GFRNONAA 79* 07/26/2012 0445   GFRAA >90 07/26/2012 0445    CBG (last 3)  No results found for this basename: GLUCAP,  in the last 72 hours  INR RESULTS:   Lab Results  Component Value Date   INR 1.00 07/13/2012     Studies/Results: No results found.  Medications: I have reviewed the patient's current medications.  Assessment/Plan: #1 Anemia: stable on current meds. #2 Physical Deconditioning: moderate, and she  will transfer to a SNF tomorrrow #3 Hypertrophic Cardiomyopathy: stable on current meds.    LOS: 5 days   Tanya Butler 07/26/2012, 11:49 AM

## 2012-07-27 LAB — CBC
Hemoglobin: 8.8 g/dL — ABNORMAL LOW (ref 12.0–15.0)
MCH: 30.3 pg (ref 26.0–34.0)
MCHC: 32.5 g/dL (ref 30.0–36.0)
MCV: 93.4 fL (ref 78.0–100.0)
Platelets: 239 10*3/uL (ref 150–400)
RBC: 2.9 MIL/uL — ABNORMAL LOW (ref 3.87–5.11)

## 2012-07-27 LAB — BASIC METABOLIC PANEL
CO2: 31 mEq/L (ref 19–32)
Calcium: 8.7 mg/dL (ref 8.4–10.5)
Creatinine, Ser: 0.81 mg/dL (ref 0.50–1.10)
Glucose, Bld: 89 mg/dL (ref 70–99)

## 2012-07-27 MED ORDER — ROPINIROLE HCL 0.5 MG PO TABS: 0.5000 mg | ORAL_TABLET | Freq: Every day | ORAL | Status: AC

## 2012-07-27 MED ORDER — POLYSACCHARIDE IRON COMPLEX 150 MG PO CAPS: 150.0000 mg | ORAL_CAPSULE | Freq: Every day | ORAL | Status: AC

## 2012-07-27 MED ORDER — ACETAMINOPHEN 325 MG PO TABS: 650.0000 mg | ORAL_TABLET | Freq: Four times a day (QID) | ORAL | Status: AC | PRN

## 2012-07-27 NOTE — Clinical Social Work Note (Signed)
Clinical Social Worker facilitated discharge by contacting patient, family and facility, [facility]. Patient will be transported via private vehicle of niece. CSW will complete discharge packet and place with shadow chart. CSW will sign off, as social work intervention is no longer needed.   Rozetta Nunnery MSW, [employer] [phone]

## 2012-07-27 NOTE — Progress Notes (Signed)
Patient discharged to [facility]. Report was given to Melvyn Neth, RN. Discharge instruction given to niece.

## 2012-07-27 NOTE — Progress Notes (Signed)
Report given to [facility], Briant Sites, RN

## 2012-07-27 NOTE — Discharge Summary (Signed)
Physician Discharge Summary    Tanya Butler  MR#: [id number]  DOB:[birth year]  Date of Admission: 07/21/2012 Date of Discharge: 07/27/2012  Attending Physician:Ecko Beasley  Patient's WUX:[id number], Tanya Butler  Consults:  Dr Bing Matter GI  Discharge Diagnoses: Principal Problem:   Acute blood loss anemia Active Problems:   RESTLESS LEG SYNDROME   DYSPNEA ON EXERTION   Upper GI bleed   Hypertrophic cardiomyopathy   Discharge Medications:   Medication List    STOP taking these medications       IRON PO      TAKE these medications       acetaminophen 325 MG tablet  Commonly known as:  TYLENOL  Take 2 tablets (650 mg total) by mouth every 6 (six) hours as needed.     albuterol 108 (90 BASE) MCG/ACT inhaler  Commonly known as:  PROVENTIL HFA;VENTOLIN HFA  Inhale 2 puffs into the lungs every 4 (four) hours as needed for wheezing or shortness of breath.     budesonide-formoterol 80-4.5 MCG/ACT inhaler  Commonly known as:  SYMBICORT  Inhale 2 puffs into the lungs 2 (two) times daily.     CITRACAL + D PO  Take 1 tablet by mouth daily. Cacium 400 mg + Vitamin D (cholecalciferol) 500 I.U.     Fish Oil 1200 MG Caps  Take 1,200 mg by mouth daily.     iron polysaccharides 150 MG capsule  Commonly known as:  NIFEREX  Take 1 capsule (150 mg total) by mouth daily.     pantoprazole 40 MG tablet  Commonly known as:  PROTONIX  Take 1 tablet (40 mg total) by mouth daily at 12 noon.     propranolol 10 MG tablet  Commonly known as:  INDERAL  Take 10 mg by mouth 2 (two) times daily.     rOPINIRole 0.5 MG tablet  Commonly known as:  REQUIP  Take 1 tablet (0.5 mg total) by mouth at bedtime.     verapamil 120 MG CR tablet  Commonly known as:  CALAN-SR  Take 120 mg by mouth daily.        Hospital Procedures: Dg Chest 2 View  07/21/2012   *RADIOLOGY REPORT*  Clinical Data: Mid chest pressure today with shortness of breath.  CHEST - 2 VIEW  Comparison: 07/14/2012.   Findings: The heart remains normal in size.  The pulmonary vasculature and interstitial markings remain prominent.  Small bilateral pleural effusions.  Diffuse osteopenia, thoracolumbar scoliosis and thoracolumbar spine degenerative changes.  Left shoulder degenerative changes.  IMPRESSION: Stable mild changes of congestive heart failure superimposed on chronic interstitial lung disease.   Original Report Authenticated By: Beckie Salts, M.D.   Dg Chest 2 View  07/14/2012   *RADIOLOGY REPORT*  Clinical Data: Dyspnea  CHEST - 2 VIEW  Comparison: October 10, 2009.  Findings: Cardiomediastinal silhouette appears normal.  Mild central pulmonary vascular congestion is noted with mild bilateral perihilar edema and mild associated pleural effusions.  Lower thoracic vertebral body compression deformity is noted of indeterminate age.  IMPRESSION: Mild bilateral perihilar edema with mild associated pleural effusions.   Original Report Authenticated By: Lupita Raider.,  M.D.    History of Present Illness: Pt was admitted for dyspnea and found to be anemic.   Hospital Course: Upper GI bleed w/ anemia - Pt was recently discharged from [facility] after be diagnosed and treated for a presumed dieulafoy lesion on EGD that was treated locally by the GI consulting service during the EGD.  Her Hgb remained stable x 48 hours after procedure and she was discharged home. She then presented a few days after discharge w/ recurrent dyspnea and found to have a Hgb of 6 in the [facility] ED. 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 This was treated locally during the EGD and patient was monitored for recurrent bleeding. Her Hgb responded appropriately to 3U PRBCs and her Hgb remained at 8.8-9.0 for >48 hours after procedure. She should have her CBC rechecked on Friday to ensure continued stability of her Hgb   In addition, given her stays in the hospital, she is becoming weaker, so in agreement w/ PTs  recommendation, she is being discharged to a SNF for rehab w/ transition home. This has been arranged and she will be discharged to [facility] for rehab.   RLS - she was started on requip qhs and responded appropriately. She was also started on daily PO iron.   Fe def anemia in setting of upper GI bleed - she was started on daily PO iron supplements.   HOCM - this is being managed by Dr Donnie Aho. She was slowly resumed on her rate controlling medications as her BP improved w/ blood transfusions. She was stable on home meds prior to discharge. She has f/u w/ Dr Donnie Aho in Sept. She needs to remain hydrated in the setting of this condition and she was reminded of this.   Day of Discharge Exam BP 112/46  Pulse 77  Temp(Src) 97.9 F (36.6 C) (Oral)  Resp 18  Ht 5\' 4"  (1.626 m)  Wt 141 lb 8.6 oz (64.2 kg)  BMI 24.28 kg/m2  SpO2 95%  Physical Exam: General appearance: elderly female in NAD  Eyes: no scleral icterus Throat: oropharynx moist without erythema Resp: CTAB, no wheezes or rales  Cardio: RRR, 4/6 SEM, 2+ radial pulses B/L  GI: soft, non-tender; bowel sounds normal; no masses,  no organomegaly Extremities: no clubbing, cyanosis or edema  Discharge Labs:  Recent Labs  07/26/12 0445 07/27/12 0515  NA 136 139  K 4.4 3.8  CL 104 105  CO2 28 31  GLUCOSE 89 89  BUN 14 14  CREATININE 0.68 0.81  CALCIUM 8.6 8.7   No results found for this basename: AST, ALT, ALKPHOS, BILITOT, PROT, ALBUMIN,  in the last 72 hours  Recent Labs  07/26/12 0445 07/27/12 0515  WBC 6.2 5.6  HGB 8.9* 8.8*  HCT 27.8* 27.1*  MCV 94.6 93.4  PLT 216 239   Lab Results  Component Value Date   INR 1.00 07/13/2012   No results found for this basename: CKTOTAL, CKMB, CKMBINDEX, TROPONINI,  in the last 72 hours No results found for this basename: TSH, T4TOTAL, FREET3, T3FREE, THYROIDAB,  in the last 72 hours No results found for this basename: VITAMINB12, FOLATE, FERRITIN, TIBC, IRON, RETICCTPCT,  in  the last 72 hours  Discharge instructions:     Discharge Orders   Future Orders Complete By Expires     Call Butler for:  extreme fatigue  As directed     Call Butler for:  persistant dizziness or light-headedness  As directed     Call Butler for:  persistant nausea and vomiting  As directed     Diet - low sodium heart healthy  As directed     Discharge instructions  As directed     Comments:      Recheck CBC on Friday 07/31/12, to ensure Hgb remains stable at >8. Also,  patient suffers from hypertrophic cardiomyopathy, so it is essential that she remains hydrated with 8 cups of water daily.    Increase activity slowly  As directed       01-Home or Self Care   Disposition: SNF   Follow-up Appts: Follow-up with Dr. Link Snuffer at [facility] after discharge from SNF.  Call for appointment.  Condition on Discharge: stable   Tests Needing Follow-up: CBC on Friday   Time spent in discharge (includes decision making & examination of pt):   45 minutes    Signed: Gussie Murton 07/27/2012, 9:30 AM

## 2012-10-19 DIAGNOSIS — Z8719 Personal history of other diseases of the digestive system: Secondary | ICD-10-CM

## 2012-10-26 ENCOUNTER — Other Ambulatory Visit: Payer: Self-pay

## 2012-10-26 DIAGNOSIS — Z1231 Encounter for screening mammogram for malignant neoplasm of breast: Secondary | ICD-10-CM

## 2012-12-07 ENCOUNTER — Ambulatory Visit
Admission: RE | Admit: 2012-12-07 | Discharge: 2012-12-07 | Disposition: A | Discharge status: Home / Self Care | Payer: Medicare Other | Source: Ambulatory Visit

## 2012-12-07 DIAGNOSIS — Z1231 Encounter for screening mammogram for malignant neoplasm of breast: Secondary | ICD-10-CM

## 2013-01-07 ENCOUNTER — Ambulatory Visit (INDEPENDENT_AMBULATORY_CARE_PROVIDER_SITE_OTHER): Payer: Medicare Other | Admitting: Emergency Medicine

## 2013-01-07 ENCOUNTER — Encounter: Payer: Self-pay | Admitting: Emergency Medicine

## 2013-01-07 VITALS — BP 100/62 | HR 87 | Ht 64.0 in | Wt 138.4 lb

## 2013-01-07 DIAGNOSIS — J449 Chronic obstructive pulmonary disease, unspecified: Secondary | ICD-10-CM

## 2013-01-07 MED ORDER — TIOTROPIUM BROMIDE MONOHYDRATE 18 MCG IN CAPS: 18.0000 ug | ORAL_CAPSULE | Freq: Every day | RESPIRATORY_TRACT | Status: DC

## 2013-01-07 NOTE — Assessment & Plan Note (Signed)
Seems to be worse since last time. She has been anemic which could be a contributor. Suspect that some of this is COPD as well.  - walking oximetry today - will add spiriva to symbicort to see if she benefits.  - rov 1 month

## 2013-01-07 NOTE — Progress Notes (Signed)
79 yo former smoker, carries dx COPD made recently by PFTs, HTN, CAD with abnormal perfusion stress test 3/11.  ROV 12/08/09 -- f/u dyspnea, AFL by spiro, also hx CAD. Last time we started Spiriva to see if she would benefit. She may have benefitted some, still has to stop to rest when walking. She has noticed dry mouth, evolving a dry throat and cough.   ROV 01/05/10 -- f/u for COPD, dyspnea. last time we stopped Spiriva and started Symbicort 80 two times a day, she believes that she is a bit better, exercise tolerance better. Cough resolved with d/c Spiriva. No desat with walking at initial visit.   ROV 04/06/10 -- COPD. Feels that she is doing well. She did have a URI about 3 weeks ago, has some residual cough. Has dry cough. Breathes better on the Symbicort. Some clear drainage. Changed cardiologists to Dr Donnie Aho. Propranolol decreased to 10mg  two times a day.   ROV 10/09/10 -- COPD, last seen 6 months ago. She believes that she is a bit better than last time, less exertional SOB. She remains on Symbicort 80 bid, doesn't need ProAir. No wheeze or cough. No exacerbations. Pneumovax in 2008.   ROV 05/02/11 -- COPD here for regular f/u. On Symbicort 80, has decreased it to qam. Hasn't missed the evening dose. She has never used her rescue inhaler. No exacerbations, no hospitalizations. She has had some shoulder problems since last time, has responded well to therapy.   ROV 01/07/13 -- follows for COPD. Unfortunately this Summer she had PUD and anemia requiring EGD. Noted AVM that was treated. Now back home from Rehab. Any activity makes her winded, fatigued. She may hear some wheeze, no cough. She uses albuterol rarely, seems to help her.    Filed Vitals:   01/07/13 1523  BP: 100/62  Pulse: 87  Height: 5\' 4"  (1.626 m)  Weight: 138 lb 6.4 oz (62.778 kg)  SpO2: 94%   Gen: Pleasant, elderly woman, in no distress,  normal affect  ENT: No lesions,  mouth clear,  oropharynx clear, no postnasal  drip  Neck: No JVD, no TMG, no carotid bruits  Lungs: No use of accessory muscles, no dullness to percussion, clear without rales or rhonchi  Cardiovascular: RRR, heart sounds normal, no murmur or gallops, no peripheral edema  Musculoskeletal: No deformities, no cyanosis or clubbing  Neuro: alert, non focal  Skin: Warm, no lesions or rashes   C O P D Seems to be worse since last time. She has been anemic which could be a contributor. Suspect that some of this is COPD as well.  - walking oximetry today - will add spiriva to symbicort to see if she benefits.  - rov 1 month

## 2013-01-07 NOTE — Patient Instructions (Signed)
Walking oximetry today Start Spiriva daily until our next visit to see if you benefit Continue symbicort twice a day Follow with Dr Delton Coombes in 1 month

## 2013-01-11 ENCOUNTER — Telehealth: Payer: Self-pay | Admitting: Emergency Medicine

## 2013-01-11 MED ORDER — ACLIDINIUM BROMIDE 400 MCG/ACT IN AEPB: 1.0000 | INHALATION_SPRAY | Freq: Two times a day (BID) | RESPIRATORY_TRACT | Status: DC

## 2013-01-11 NOTE — Telephone Encounter (Signed)
Called spoke with patient, advised of MR's recommendations as stated below. Pt verbalized her understanding and denied any questions and is aware to continue Symbicort She will come to the office this afternoon to pick up samples or Tudorza and receive MDI teaching 2 samples Tudorza documented per protocol and placed up front with note that pt needs MDI teaching [facility], spoke with [employer] and gave verbal order to D/C the spiriva and advised [employer] rx will be sent electronically Spiriva added to allergy/intolerance list Will forward to RB as FYI Nothing further needed at this time; will sign off.

## 2013-01-11 NOTE — Telephone Encounter (Signed)
Stop spioriva and watch or  stop spiriva, come to office and get some tudorza samples to see how this work  Always continue symbicort  Dr. Kalman Shan, M.D., [facility].C.P Pulmonary and Critical Care Medicine Staff Physician [facility] Pager: [phone], If no answer or between  15:00h - 7:00h: call [phone]  01/11/2013 9:44 AM

## 2013-01-11 NOTE — Telephone Encounter (Signed)
Spoke to pt. At her last OV RB started her on Spiriva in combination with Symbicort. States that this has made her breathing worse. Advised pt not to take Spiriva anymore, just use Symbicort.  MR please advise as RB is on vacation.

## 2013-01-22 ENCOUNTER — Other Ambulatory Visit: Payer: Self-pay | Admitting: *Deleted

## 2013-01-22 MED ORDER — BUDESONIDE-FORMOTEROL FUMARATE 80-4.5 MCG/ACT IN AERO: 2.0000 | INHALATION_SPRAY | Freq: Two times a day (BID) | RESPIRATORY_TRACT | Status: DC

## 2013-01-27 ENCOUNTER — Other Ambulatory Visit: Payer: Self-pay | Admitting: *Deleted

## 2013-01-27 MED ORDER — BUDESONIDE-FORMOTEROL FUMARATE 80-4.5 MCG/ACT IN AERO: 2.0000 | INHALATION_SPRAY | Freq: Two times a day (BID) | RESPIRATORY_TRACT | Status: DC

## 2013-02-11 ENCOUNTER — Ambulatory Visit (INDEPENDENT_AMBULATORY_CARE_PROVIDER_SITE_OTHER): Payer: Medicare Other | Admitting: Emergency Medicine

## 2013-02-11 ENCOUNTER — Encounter: Payer: Self-pay | Admitting: Emergency Medicine

## 2013-02-11 VITALS — BP 110/62 | HR 73 | Ht 62.0 in | Wt 139.0 lb

## 2013-02-11 DIAGNOSIS — J449 Chronic obstructive pulmonary disease, unspecified: Secondary | ICD-10-CM

## 2013-02-11 MED ORDER — ACLIDINIUM BROMIDE 400 MCG/ACT IN AEPB: 1.0000 | INHALATION_SPRAY | Freq: Two times a day (BID) | RESPIRATORY_TRACT | Status: DC

## 2013-02-11 NOTE — Progress Notes (Signed)
80 yo former smoker, carries dx COPD made recently by PFTs, HTN, CAD with abnormal perfusion stress test 3/11.  ROV 12/08/09 -- f/u dyspnea, AFL by spiro, also hx CAD. Last time we started Spiriva to see if she would benefit. She may have benefitted some, still has to stop to rest when walking. She has noticed dry mouth, evolving a dry throat and cough.   ROV 01/05/10 -- f/u for COPD, dyspnea. last time we stopped Spiriva and started Symbicort 80 two times a day, she believes that she is a bit better, exercise tolerance better. Cough resolved with d/c Spiriva. No desat with walking at initial visit.   ROV 04/06/10 -- COPD. Feels that she is doing well. She did have a URI about 3 weeks ago, has some residual cough. Has dry cough. Breathes better on the Symbicort. Some clear drainage. Changed cardiologists to Dr Wynonia Lawman. Propranolol decreased to 10mg  two times a day.   ROV 10/09/10 -- COPD, last seen 6 months ago. She believes that she is a bit better than last time, less exertional SOB. She remains on Symbicort 80 bid, doesn't need ProAir. No wheeze or cough. No exacerbations. Pneumovax in 2008.   ROV 05/02/11 -- COPD here for regular f/u. On Symbicort 80, has decreased it to qam. Hasn't missed the evening dose. She has never used her rescue inhaler. No exacerbations, no hospitalizations. She has had some shoulder problems since last time, has responded well to therapy.   ROV 01/07/13 -- follows for COPD. Unfortunately this Summer she had PUD and anemia requiring EGD. Noted AVM that was treated. Now back home from Rehab. Any activity makes her winded, fatigued. She may hear some wheeze, no cough. She uses albuterol rarely, seems to help her.   ROV 02/11/13 -- hx of COPD, GIB as above. Last visit we added Spiriva to Symbicort > she couldn't take it due to throat irritation. She was switched to [place], which she has been using once a day instead of twice. She feels better, more active. No wheeze or cough. She  did have a URI since last visit and was treated with Z-pack by her PCP.    Filed Vitals:   02/11/13 1029  BP: 110/62  Pulse: 73  Height: 5\' 2"  (1.575 m)  Weight: 139 lb (63.05 kg)  SpO2: 97%   Gen: Pleasant, elderly woman, in no distress,  normal affect  ENT: No lesions,  mouth clear,  oropharynx clear, no postnasal drip  Neck: No JVD, no TMG, no carotid bruits  Lungs: No use of accessory muscles, no dullness to percussion, clear without rales or rhonchi  Cardiovascular: RRR, heart sounds normal, no murmur or gallops, no peripheral edema  Musculoskeletal: No deformities, no cyanosis or clubbing  Neuro: alert, non focal  Skin: Warm, no lesions or rashes   C O P D - continue Symbicort - script for tudorza to pharmacy - rov 6

## 2013-02-11 NOTE — Patient Instructions (Signed)
Please continue Symbicort 2 puffs twice a day We will continue Tudorza twice a day. A script for this medication will be sent to your pharmacy.  Follow with Dr Lamonte Sakai in 6 months or sooner if you have any problems

## 2013-02-11 NOTE — Assessment & Plan Note (Signed)
-   continue Symbicort - script for tudorza to pharmacy - rov 6

## 2013-04-20 ENCOUNTER — Other Ambulatory Visit: Payer: Self-pay | Admitting: Emergency Medicine

## 2013-08-27 ENCOUNTER — Ambulatory Visit (INDEPENDENT_AMBULATORY_CARE_PROVIDER_SITE_OTHER): Payer: Medicare Other | Admitting: Emergency Medicine

## 2013-08-27 ENCOUNTER — Encounter: Payer: Self-pay | Admitting: Emergency Medicine

## 2013-08-27 VITALS — BP 112/64 | HR 82 | Ht 62.0 in | Wt 142.0 lb

## 2013-08-27 DIAGNOSIS — J449 Chronic obstructive pulmonary disease, unspecified: Secondary | ICD-10-CM

## 2013-08-27 DIAGNOSIS — Z23 Encounter for immunization: Secondary | ICD-10-CM

## 2013-08-27 NOTE — Addendum Note (Signed)
Addended by: Virl Cagey on: 08/27/2013 03:33 PM   Modules accepted: Orders

## 2013-08-27 NOTE — Progress Notes (Signed)
80 yo former smoker, carries dx COPD made recently by PFTs, HTN, CAD with abnormal perfusion stress test 3/11.  ROV 12/08/09 -- f/u dyspnea, AFL by spiro, also hx CAD. Last time we started Spiriva to see if she would benefit. She may have benefitted some, still has to stop to rest when walking. She has noticed dry mouth, evolving a dry throat and cough.   ROV 01/05/10 -- f/u for COPD, dyspnea. last time we stopped Spiriva and started Symbicort 80 two times a day, she believes that she is a bit better, exercise tolerance better. Cough resolved with d/c Spiriva. No desat with walking at initial visit.   ROV 04/06/10 -- COPD. Feels that she is doing well. She did have a URI about 3 weeks ago, has some residual cough. Has dry cough. Breathes better on the Symbicort. Some clear drainage. Changed cardiologists to Dr Wynonia Lawman. Propranolol decreased to 10mg  two times a day.   ROV 10/09/10 -- COPD, last seen 6 months ago. She believes that she is a bit better than last time, less exertional SOB. She remains on Symbicort 80 bid, doesn't need ProAir. No wheeze or cough. No exacerbations. Pneumovax in 2008.   ROV 05/02/11 -- COPD here for regular f/u. On Symbicort 80, has decreased it to qam. Hasn't missed the evening dose. She has never used her rescue inhaler. No exacerbations, no hospitalizations. She has had some shoulder problems since last time, has responded well to therapy.   ROV 01/07/13 -- follows for COPD. Unfortunately this Summer she had PUD and anemia requiring EGD. Noted AVM that was treated. Now back home from Rehab. Any activity makes her winded, fatigued. She may hear some wheeze, no cough. She uses albuterol rarely, seems to help her.   ROV 02/11/13 -- hx of COPD, GIB as above. Last visit we added Spiriva to Symbicort > she couldn't take it due to throat irritation. She was switched to [place], which she has been using once a day instead of twice. She feels better, more active. No wheeze or cough. She  did have a URI since last visit and was treated with Z-pack by her PCP.   ROV 08/27/13 -- follows for her COPD, rhinitis.  She is on Symbicort bid, has been using tudorza but inconsistently. She feels that she has been breathing well. No flares since last visit. She has never had Prevnar.    Filed Vitals:   08/27/13 1501  BP: 112/64  Pulse: 82  Height: 5\' 2"  (1.575 m)  Weight: 142 lb (64.411 kg)  SpO2: 98%   Gen: Pleasant, elderly woman, in no distress,  normal affect  ENT: No lesions,  mouth clear,  oropharynx clear, no postnasal drip  Neck: No JVD, no TMG, no carotid bruits  Lungs: No use of accessory muscles, no dullness to percussion, clear without rales or rhonchi  Cardiovascular: RRR, heart sounds normal, no murmur or gallops, no peripheral edema  Musculoskeletal: No deformities, no cyanosis or clubbing  Neuro: alert, non focal  Skin: Warm, no lesions or rashes   C O P D - will continue symbicort - will start using tudorza on a schedule, f/u in 3 months. If she isn't benefiting then will probably d/c altogether.  - prevnar 13  - rov 3

## 2013-08-27 NOTE — Assessment & Plan Note (Signed)
-   will continue symbicort - will start using tudorza on a schedule, f/u in 3 months. If she isn't benefiting then will probably d/c altogether.  - prevnar 13  - rov 3

## 2013-08-27 NOTE — Patient Instructions (Signed)
Please continue Symbicort twice a day Start using your Caprice Renshaw twice a day on a schedule We will give your the Prevnar-13 today Follow with Dr Lamonte Sakai in 3 months or sooner if you have any problems.

## 2013-11-10 ENCOUNTER — Other Ambulatory Visit: Payer: Self-pay

## 2013-11-10 DIAGNOSIS — Z1231 Encounter for screening mammogram for malignant neoplasm of breast: Secondary | ICD-10-CM

## 2013-11-25 ENCOUNTER — Ambulatory Visit (INDEPENDENT_AMBULATORY_CARE_PROVIDER_SITE_OTHER): Payer: Medicare Other | Admitting: Emergency Medicine

## 2013-11-25 ENCOUNTER — Encounter: Payer: Self-pay | Admitting: Emergency Medicine

## 2013-11-25 VITALS — BP 116/78 | HR 68 | Temp 97.1°F | Ht 64.0 in | Wt 148.6 lb

## 2013-11-25 DIAGNOSIS — J449 Chronic obstructive pulmonary disease, unspecified: Secondary | ICD-10-CM

## 2013-11-25 MED ORDER — ALBUTEROL SULFATE HFA 108 (90 BASE) MCG/ACT IN AERS: 2.0000 | INHALATION_SPRAY | RESPIRATORY_TRACT | Status: DC | PRN

## 2013-11-25 NOTE — Progress Notes (Signed)
80 yo former smoker, carries dx COPD made recently by PFTs, HTN, CAD with abnormal perfusion stress test 3/11.  ROV 12/08/09 -- f/u dyspnea, AFL by spiro, also hx CAD. Last time we started Spiriva to see if she would benefit. She may have benefitted some, still has to stop to rest when walking. She has noticed dry mouth, evolving a dry throat and cough.   ROV 01/05/10 -- f/u for COPD, dyspnea. last time we stopped Spiriva and started Symbicort 80 two times a day, she believes that she is a bit better, exercise tolerance better. Cough resolved with d/c Spiriva. No desat with walking at initial visit.   ROV 04/06/10 -- COPD. Feels that she is doing well. She did have a URI about 3 weeks ago, has some residual cough. Has dry cough. Breathes better on the Symbicort. Some clear drainage. Changed cardiologists to Dr Wynonia Lawman. Propranolol decreased to 10mg  two times a day.   ROV 10/09/10 -- COPD, last seen 6 months ago. She believes that she is a bit better than last time, less exertional SOB. She remains on Symbicort 80 bid, doesn't need ProAir. No wheeze or cough. No exacerbations. Pneumovax in 2008.   ROV 05/02/11 -- COPD here for regular f/u. On Symbicort 80, has decreased it to qam. Hasn't missed the evening dose. She has never used her rescue inhaler. No exacerbations, no hospitalizations. She has had some shoulder problems since last time, has responded well to therapy.   ROV 01/07/13 -- follows for COPD. Unfortunately this Summer she had PUD and anemia requiring EGD. Noted AVM that was treated. Now back home from Rehab. Any activity makes her winded, fatigued. She may hear some wheeze, no cough. She uses albuterol rarely, seems to help her.   ROV 02/11/13 -- hx of COPD, GIB as above. Last visit we added Spiriva to Symbicort > she couldn't take it due to throat irritation. She was switched to [place], which she has been using once a day instead of twice. She feels better, more active. No wheeze or cough. She  did have a URI since last visit and was treated with Z-pack by her PCP.   ROV 08/27/13 -- follows for her COPD, rhinitis.  She is on Symbicort bid, has been using tudorza but inconsistently. She feels that she has been breathing well. No flares since last visit. She has never had Prevnar.   ROV 11/25/13 -- hx of COPD, allergic rhinitis.  She is on Symbicort, restarted tudorza and feels that it has helped her some. She is using albuterol very rarely, usually after bending. She still has rhinitis, not on an allergy regimen.    Filed Vitals:   11/25/13 1038  BP: 116/78  Pulse: 68  Temp: 97.1 F (36.2 C)  TempSrc: Oral  Height: 5\' 4"  (1.626 m)  Weight: 148 lb 9.6 oz (67.405 kg)  SpO2: 97%   Gen: Pleasant, elderly woman, in no distress,  normal affect  ENT: No lesions,  mouth clear,  oropharynx clear, no postnasal drip  Neck: No JVD, no TMG, no carotid bruits  Lungs: No use of accessory muscles, clear without rales or rhonchi, no wheeze on forced exp  Cardiovascular: RRR, heart sounds normal, no murmur or gallops, no peripheral edema  Musculoskeletal: No deformities, no cyanosis or clubbing  Neuro: alert, non focal  Skin: Warm, no lesions or rashes   COPD (chronic obstructive pulmonary disease) She is clinically stable. She does believe that she benefited from the re-addition of [place]. She  continues to have rhinitis but does not believe this is bothering her breathing. She does not have cough. She needs a refill on her albuterol  Please continue your Symbicort and Caprice Renshaw as you are taking them Use albuterol 2 puffs up to every 4 hours if needed for shortness of breath Follow with Dr Lamonte Sakai in 6 months or sooner if you have any problems

## 2013-11-25 NOTE — Assessment & Plan Note (Signed)
She is clinically stable. She does believe that she benefited from the re-addition of [place]. She continues to have rhinitis but does not believe this is bothering her breathing. She does not have cough. She needs a refill on her albuterol  Please continue your Symbicort and Caprice Renshaw as you are taking them Use albuterol 2 puffs up to every 4 hours if needed for shortness of breath Follow with Dr Lamonte Sakai in 6 months or sooner if you have any problems

## 2013-11-25 NOTE — Patient Instructions (Signed)
Please continue your Symbicort and Tanya Butler as you are taking them Use albuterol 2 puffs up to every 4 hours if needed for shortness of breath Follow with Dr Lamonte Sakai in 6 months or sooner if you have any problems

## 2013-12-07 ENCOUNTER — Other Ambulatory Visit: Payer: Self-pay | Admitting: Emergency Medicine

## 2013-12-09 ENCOUNTER — Ambulatory Visit
Admission: RE | Admit: 2013-12-09 | Discharge: 2013-12-09 | Disposition: A | Discharge status: Home / Self Care | Payer: Medicare Other | Source: Ambulatory Visit

## 2013-12-09 DIAGNOSIS — Z1231 Encounter for screening mammogram for malignant neoplasm of breast: Secondary | ICD-10-CM

## 2013-12-14 ENCOUNTER — Other Ambulatory Visit: Payer: Self-pay | Admitting: *Deleted

## 2013-12-14 ENCOUNTER — Telehealth: Payer: Self-pay | Admitting: Emergency Medicine

## 2013-12-14 MED ORDER — BUDESONIDE-FORMOTEROL FUMARATE 80-4.5 MCG/ACT IN AERO
2.0000 | INHALATION_SPRAY | Freq: Two times a day (BID) | RESPIRATORY_TRACT | Status: DC
Start: 2013-12-14 — End: 2014-02-03

## 2013-12-14 NOTE — Telephone Encounter (Signed)
Refill sent to [facility].  Nothing further needed.

## 2014-01-19 ENCOUNTER — Emergency Department (HOSPITAL_COMMUNITY): Payer: Medicare Other

## 2014-01-19 ENCOUNTER — Encounter (HOSPITAL_COMMUNITY): Payer: Self-pay | Admitting: Neurology

## 2014-01-19 ENCOUNTER — Inpatient Hospital Stay (HOSPITAL_COMMUNITY)
Admission: EM | Admit: 2014-01-19 | Discharge: 2014-02-03 | DRG: 004 | Disposition: A | Discharge status: Skilled Nursing Facility | Payer: Medicare Other | Attending: Pulmonary Disease | Admitting: Pulmonary Disease

## 2014-01-19 DIAGNOSIS — E876 Hypokalemia: Secondary | ICD-10-CM | POA: Diagnosis not present

## 2014-01-19 DIAGNOSIS — I4891 Unspecified atrial fibrillation: Secondary | ICD-10-CM | POA: Diagnosis present

## 2014-01-19 DIAGNOSIS — J690 Pneumonitis due to inhalation of food and vomit: Secondary | ICD-10-CM | POA: Diagnosis present

## 2014-01-19 DIAGNOSIS — R600 Localized edema: Secondary | ICD-10-CM | POA: Diagnosis not present

## 2014-01-19 DIAGNOSIS — Z888 Allergy status to other drugs, medicaments and biological substances status: Secondary | ICD-10-CM | POA: Diagnosis not present

## 2014-01-19 DIAGNOSIS — I959 Hypotension, unspecified: Secondary | ICD-10-CM | POA: Diagnosis present

## 2014-01-19 DIAGNOSIS — G2581 Restless legs syndrome: Secondary | ICD-10-CM | POA: Diagnosis present

## 2014-01-19 DIAGNOSIS — I471 Supraventricular tachycardia: Secondary | ICD-10-CM | POA: Diagnosis present

## 2014-01-19 DIAGNOSIS — K219 Gastro-esophageal reflux disease without esophagitis: Secondary | ICD-10-CM | POA: Diagnosis present

## 2014-01-19 DIAGNOSIS — R739 Hyperglycemia, unspecified: Secondary | ICD-10-CM | POA: Diagnosis present

## 2014-01-19 DIAGNOSIS — R570 Cardiogenic shock: Secondary | ICD-10-CM | POA: Diagnosis not present

## 2014-01-19 DIAGNOSIS — J441 Chronic obstructive pulmonary disease with (acute) exacerbation: Secondary | ICD-10-CM | POA: Insufficient documentation

## 2014-01-19 DIAGNOSIS — E785 Hyperlipidemia, unspecified: Secondary | ICD-10-CM | POA: Diagnosis present

## 2014-01-19 DIAGNOSIS — I214 Non-ST elevation (NSTEMI) myocardial infarction: Secondary | ICD-10-CM | POA: Diagnosis not present

## 2014-01-19 DIAGNOSIS — I10 Essential (primary) hypertension: Secondary | ICD-10-CM | POA: Diagnosis present

## 2014-01-19 DIAGNOSIS — Z978 Presence of other specified devices: Secondary | ICD-10-CM

## 2014-01-19 DIAGNOSIS — Z9289 Personal history of other medical treatment: Secondary | ICD-10-CM

## 2014-01-19 DIAGNOSIS — R7989 Other specified abnormal findings of blood chemistry: Secondary | ICD-10-CM | POA: Diagnosis not present

## 2014-01-19 DIAGNOSIS — Z79899 Other long term (current) drug therapy: Secondary | ICD-10-CM

## 2014-01-19 DIAGNOSIS — G9341 Metabolic encephalopathy: Secondary | ICD-10-CM | POA: Diagnosis present

## 2014-01-19 DIAGNOSIS — R451 Restlessness and agitation: Secondary | ICD-10-CM | POA: Diagnosis present

## 2014-01-19 DIAGNOSIS — I351 Nonrheumatic aortic (valve) insufficiency: Secondary | ICD-10-CM | POA: Diagnosis present

## 2014-01-19 DIAGNOSIS — Z930 Tracheostomy status: Secondary | ICD-10-CM | POA: Diagnosis not present

## 2014-01-19 DIAGNOSIS — B9620 Unspecified Escherichia coli [E. coli] as the cause of diseases classified elsewhere: Secondary | ICD-10-CM | POA: Diagnosis present

## 2014-01-19 DIAGNOSIS — M810 Age-related osteoporosis without current pathological fracture: Secondary | ICD-10-CM | POA: Diagnosis present

## 2014-01-19 DIAGNOSIS — J449 Chronic obstructive pulmonary disease, unspecified: Secondary | ICD-10-CM | POA: Diagnosis present

## 2014-01-19 DIAGNOSIS — Z87891 Personal history of nicotine dependence: Secondary | ICD-10-CM | POA: Diagnosis not present

## 2014-01-19 DIAGNOSIS — R778 Other specified abnormalities of plasma proteins: Secondary | ICD-10-CM | POA: Diagnosis not present

## 2014-01-19 DIAGNOSIS — Z85828 Personal history of other malignant neoplasm of skin: Secondary | ICD-10-CM

## 2014-01-19 DIAGNOSIS — R0603 Acute respiratory distress: Secondary | ICD-10-CM

## 2014-01-19 DIAGNOSIS — J9600 Acute respiratory failure, unspecified whether with hypoxia or hypercapnia: Secondary | ICD-10-CM | POA: Diagnosis present

## 2014-01-19 DIAGNOSIS — J40 Bronchitis, not specified as acute or chronic: Secondary | ICD-10-CM | POA: Diagnosis not present

## 2014-01-19 DIAGNOSIS — R0602 Shortness of breath: Secondary | ICD-10-CM | POA: Insufficient documentation

## 2014-01-19 DIAGNOSIS — I119 Hypertensive heart disease without heart failure: Secondary | ICD-10-CM | POA: Diagnosis present

## 2014-01-19 DIAGNOSIS — J9601 Acute respiratory failure with hypoxia: Principal | ICD-10-CM | POA: Diagnosis present

## 2014-01-19 DIAGNOSIS — J849 Interstitial pulmonary disease, unspecified: Secondary | ICD-10-CM

## 2014-01-19 DIAGNOSIS — Z4659 Encounter for fitting and adjustment of other gastrointestinal appliance and device: Secondary | ICD-10-CM

## 2014-01-19 DIAGNOSIS — N179 Acute kidney failure, unspecified: Secondary | ICD-10-CM | POA: Diagnosis present

## 2014-01-19 DIAGNOSIS — R061 Stridor: Secondary | ICD-10-CM

## 2014-01-19 DIAGNOSIS — J9690 Respiratory failure, unspecified, unspecified whether with hypoxia or hypercapnia: Secondary | ICD-10-CM

## 2014-01-19 LAB — BLOOD GAS, ARTERIAL
Acid-base deficit: 0.1 mmol/L (ref 0.0–2.0)
BICARBONATE: 25.5 meq/L — AB (ref 20.0–24.0)
Drawn by: 41881
FIO2: 0.6 %
MECHVT: 460 mL
O2 SAT: 97.1 %
PEEP/CPAP: 5 cmH2O
PH ART: 7.304 — AB (ref 7.350–7.450)
Patient temperature: 98.6
RATE: 20 resp/min
TCO2: 27.1 mmol/L (ref 0–100)
pCO2 arterial: 53 mmHg — ABNORMAL HIGH (ref 35.0–45.0)
pO2, Arterial: 104 mmHg — ABNORMAL HIGH (ref 80.0–100.0)

## 2014-01-19 LAB — URINE MICROSCOPIC-ADD ON

## 2014-01-19 LAB — CBC WITH DIFFERENTIAL/PLATELET
Basophils Absolute: 0.1 10*3/uL (ref 0.0–0.1)
Basophils Relative: 1 % (ref 0–1)
Eosinophils Absolute: 0.3 10*3/uL (ref 0.0–0.7)
Eosinophils Relative: 2 % (ref 0–5)
HCT: 43.7 % (ref 36.0–46.0)
Hemoglobin: 13.9 g/dL (ref 12.0–15.0)
Lymphocytes Relative: 27 % (ref 12–46)
Lymphs Abs: 3.2 10*3/uL (ref 0.7–4.0)
MCH: 30.8 pg (ref 26.0–34.0)
MCHC: 31.8 g/dL (ref 30.0–36.0)
MCV: 96.9 fL (ref 78.0–100.0)
Monocytes Absolute: 1.2 10*3/uL — ABNORMAL HIGH (ref 0.1–1.0)
Monocytes Relative: 10 % (ref 3–12)
Neutro Abs: 7.2 10*3/uL (ref 1.7–7.7)
Neutrophils Relative %: 60 % (ref 43–77)
Platelets: 248 10*3/uL (ref 150–400)
RBC: 4.51 MIL/uL (ref 3.87–5.11)
RDW: 15.1 % (ref 11.5–15.5)
WBC: 12 10*3/uL — ABNORMAL HIGH (ref 4.0–10.5)

## 2014-01-19 LAB — MRSA PCR SCREENING: MRSA BY PCR: NEGATIVE

## 2014-01-19 LAB — URINALYSIS, ROUTINE W REFLEX MICROSCOPIC
Bilirubin Urine: NEGATIVE
Glucose, UA: NEGATIVE mg/dL
Ketones, ur: NEGATIVE mg/dL
Nitrite: POSITIVE — AB
Protein, ur: >300 mg/dL — AB
Specific Gravity, Urine: 1.026 (ref 1.005–1.030)
Urobilinogen, UA: 0.2 mg/dL (ref 0.0–1.0)
pH: 5.5 (ref 5.0–8.0)

## 2014-01-19 LAB — BASIC METABOLIC PANEL
Anion gap: 7 (ref 5–15)
BUN: 15 mg/dL (ref 6–23)
CO2: 26 mmol/L (ref 19–32)
Calcium: 9.1 mg/dL (ref 8.4–10.5)
Chloride: 106 mEq/L (ref 96–112)
Creatinine, Ser: 1.26 mg/dL — ABNORMAL HIGH (ref 0.50–1.10)
GFR calc Af Amer: 44 mL/min — ABNORMAL LOW (ref >90)
GFR calc non Af Amer: 38 mL/min — ABNORMAL LOW (ref >90)
Glucose, Bld: 173 mg/dL — ABNORMAL HIGH (ref 70–99)
Potassium: 5 mmol/L (ref 3.5–5.1)
Sodium: 139 mmol/L (ref 135–145)

## 2014-01-19 LAB — I-STAT TROPONIN, ED: Troponin i, poc: 0.01 ng/mL (ref 0.00–0.08)

## 2014-01-19 LAB — BRAIN NATRIURETIC PEPTIDE: B Natriuretic Peptide: 584.6 pg/mL — ABNORMAL HIGH (ref 0.0–100.0)

## 2014-01-19 LAB — I-STAT CG4 LACTIC ACID, ED: Lactic Acid, Venous: 2.38 mmol/L — ABNORMAL HIGH (ref 0.5–2.2)

## 2014-01-19 LAB — TRIGLYCERIDES: Triglycerides: 56 mg/dL (ref <150)

## 2014-01-19 LAB — I-STAT ARTERIAL BLOOD GAS, ED
Acid-base deficit: 1 mmol/L (ref 0.0–2.0)
Bicarbonate: 28.1 mEq/L — ABNORMAL HIGH (ref 20.0–24.0)
O2 Saturation: 98 %
TCO2: 30 mmol/L (ref 0–100)
pCO2 arterial: 71.1 mmHg (ref 35.0–45.0)
pH, Arterial: 7.205 — ABNORMAL LOW (ref 7.350–7.450)
pO2, Arterial: 131 mmHg — ABNORMAL HIGH (ref 80.0–100.0)

## 2014-01-19 LAB — GLUCOSE, CAPILLARY
GLUCOSE-CAPILLARY: 125 mg/dL — AB (ref 70–99)
GLUCOSE-CAPILLARY: 143 mg/dL — AB (ref 70–99)

## 2014-01-19 MED ORDER — HEPARIN SODIUM (PORCINE) 5000 UNIT/ML IJ SOLN
5000.0000 [IU] | Freq: Three times a day (TID) | INTRAMUSCULAR | Status: DC
  Administered 2014-01-19 – 2014-01-31 (×35): 5000 [IU] via SUBCUTANEOUS
  Filled 2014-01-19 (×40): qty 1

## 2014-01-19 MED ORDER — FENTANYL CITRATE 0.05 MG/ML IJ SOLN
INTRAMUSCULAR | Status: AC | PRN
  Administered 2014-01-19: 100 ug via INTRAVENOUS

## 2014-01-19 MED ORDER — SODIUM CHLORIDE 0.9 % IV SOLN: INTRAVENOUS | Status: DC

## 2014-01-19 MED ORDER — FENTANYL CITRATE 0.05 MG/ML IJ SOLN
INTRAMUSCULAR | Status: AC
  Filled 2014-01-19: qty 2

## 2014-01-19 MED ORDER — ETOMIDATE 2 MG/ML IV SOLN
INTRAVENOUS | Status: AC | PRN
  Administered 2014-01-19: 20 mg via INTRAVENOUS

## 2014-01-19 MED ORDER — ALBUTEROL SULFATE (2.5 MG/3ML) 0.083% IN NEBU
2.5000 mg | INHALATION_SOLUTION | RESPIRATORY_TRACT | Status: DC | PRN
  Administered 2014-01-23: 2.5 mg via RESPIRATORY_TRACT
  Filled 2014-01-19: qty 3

## 2014-01-19 MED ORDER — SUCCINYLCHOLINE CHLORIDE 20 MG/ML IJ SOLN
INTRAMUSCULAR | Status: AC
  Filled 2014-01-19: qty 1

## 2014-01-19 MED ORDER — SODIUM CHLORIDE 0.9 % IV BOLUS (SEPSIS)
1000.0000 mL | Freq: Once | INTRAVENOUS | Status: AC
  Administered 2014-01-19: 1000 mL via INTRAVENOUS

## 2014-01-19 MED ORDER — CHLORHEXIDINE GLUCONATE 0.12 % MT SOLN
15.0000 mL | Freq: Two times a day (BID) | OROMUCOSAL | Status: DC
  Administered 2014-01-19 – 2014-01-25 (×12): 15 mL via OROMUCOSAL
  Filled 2014-01-19 (×12): qty 15

## 2014-01-19 MED ORDER — FAMOTIDINE IN NACL 20-0.9 MG/50ML-% IV SOLN
20.0000 mg | INTRAVENOUS | Status: DC
  Administered 2014-01-19 – 2014-01-26 (×8): 20 mg via INTRAVENOUS
  Filled 2014-01-19 (×8): qty 50

## 2014-01-19 MED ORDER — LORAZEPAM 2 MG/ML IJ SOLN
2.0000 mg | Freq: Once | INTRAMUSCULAR | Status: AC
  Administered 2014-01-19: 2 mg via INTRAVENOUS

## 2014-01-19 MED ORDER — METHYLPREDNISOLONE SODIUM SUCC 40 MG IJ SOLR
40.0000 mg | Freq: Four times a day (QID) | INTRAMUSCULAR | Status: AC
  Administered 2014-01-19 – 2014-01-20 (×3): 40 mg via INTRAVENOUS
  Filled 2014-01-19 (×2): qty 1

## 2014-01-19 MED ORDER — PROPOFOL 10 MG/ML IV EMUL
0.0000 ug/kg/min | INTRAVENOUS | Status: DC
  Administered 2014-01-19: 30 ug/kg/min via INTRAVENOUS
  Administered 2014-01-20: 17.31 ug/kg/min via INTRAVENOUS
  Administered 2014-01-20: 30 ug/kg/min via INTRAVENOUS
  Filled 2014-01-19 (×2): qty 100

## 2014-01-19 MED ORDER — INSULIN ASPART 100 UNIT/ML ~~LOC~~ SOLN
2.0000 [IU] | SUBCUTANEOUS | Status: DC
  Administered 2014-01-19 – 2014-01-20 (×5): 2 [IU] via SUBCUTANEOUS
  Administered 2014-01-20: 4 [IU] via SUBCUTANEOUS
  Administered 2014-01-21 – 2014-01-23 (×7): 2 [IU] via SUBCUTANEOUS
  Administered 2014-01-24 (×2): 4 [IU] via SUBCUTANEOUS
  Administered 2014-01-24 (×2): 2 [IU] via SUBCUTANEOUS
  Administered 2014-01-24: 4 [IU] via SUBCUTANEOUS
  Administered 2014-01-24 – 2014-01-25 (×2): 2 [IU] via SUBCUTANEOUS
  Administered 2014-01-25: 4 [IU] via SUBCUTANEOUS
  Administered 2014-01-25 – 2014-01-27 (×7): 2 [IU] via SUBCUTANEOUS
  Administered 2014-01-28: 4 [IU] via SUBCUTANEOUS
  Administered 2014-01-28 (×2): 2 [IU] via SUBCUTANEOUS
  Administered 2014-01-29: 4 [IU] via SUBCUTANEOUS
  Administered 2014-01-29 – 2014-01-30 (×3): 2 [IU] via SUBCUTANEOUS
  Administered 2014-01-30: 4 [IU] via SUBCUTANEOUS
  Administered 2014-01-31: 2 [IU] via SUBCUTANEOUS

## 2014-01-19 MED ORDER — SODIUM CHLORIDE 0.9 % IV SOLN: 250.0000 mL | INTRAVENOUS | Status: DC | PRN

## 2014-01-19 MED ORDER — LORAZEPAM 2 MG/ML IJ SOLN
INTRAMUSCULAR | Status: AC
  Filled 2014-01-19: qty 1

## 2014-01-19 MED ORDER — BUDESONIDE 0.25 MG/2ML IN SUSP
0.2500 mg | Freq: Two times a day (BID) | RESPIRATORY_TRACT | Status: DC
  Administered 2014-01-20 – 2014-02-03 (×29): 0.25 mg via RESPIRATORY_TRACT
  Filled 2014-01-19 (×33): qty 2

## 2014-01-19 MED ORDER — LORAZEPAM 1 MG PO TABS: 0.5000 mg | ORAL_TABLET | Freq: Once | ORAL | Status: DC

## 2014-01-19 MED ORDER — CETYLPYRIDINIUM CHLORIDE 0.05 % MT LIQD
7.0000 mL | Freq: Four times a day (QID) | OROMUCOSAL | Status: DC
  Administered 2014-01-20 – 2014-01-30 (×40): 7 mL via OROMUCOSAL

## 2014-01-19 MED ORDER — DIPHENHYDRAMINE HCL 50 MG/ML IJ SOLN
25.0000 mg | Freq: Three times a day (TID) | INTRAMUSCULAR | Status: DC
  Administered 2014-01-19 – 2014-01-20 (×3): 25 mg via INTRAVENOUS
  Filled 2014-01-19: qty 0.5
  Filled 2014-01-19: qty 1
  Filled 2014-01-19 (×2): qty 0.5
  Filled 2014-01-19: qty 1

## 2014-01-19 MED ORDER — LEVOFLOXACIN IN D5W 750 MG/150ML IV SOLN
750.0000 mg | INTRAVENOUS | Status: DC
  Administered 2014-01-19 – 2014-01-25 (×4): 750 mg via INTRAVENOUS
  Filled 2014-01-19 (×4): qty 150

## 2014-01-19 MED ORDER — METHYLPREDNISOLONE SODIUM SUCC 125 MG IJ SOLR
125.0000 mg | Freq: Once | INTRAMUSCULAR | Status: AC
  Administered 2014-01-19: 125 mg via INTRAVENOUS
  Filled 2014-01-19: qty 2

## 2014-01-19 MED ORDER — FENTANYL CITRATE 0.05 MG/ML IJ SOLN
50.0000 ug | INTRAMUSCULAR | Status: DC | PRN
  Administered 2014-01-19 – 2014-01-20 (×7): 50 ug via INTRAVENOUS
  Administered 2014-01-21: 25 ug via INTRAVENOUS
  Administered 2014-01-24 – 2014-01-25 (×2): 50 ug via INTRAVENOUS
  Administered 2014-01-26: 25 ug via INTRAVENOUS
  Administered 2014-01-26: 50 ug via INTRAVENOUS
  Administered 2014-01-26: 25 ug via INTRAVENOUS
  Administered 2014-01-26 – 2014-01-27 (×4): 50 ug via INTRAVENOUS
  Filled 2014-01-19 (×17): qty 2

## 2014-01-19 MED ORDER — PROPOFOL 10 MG/ML IV EMUL
5.0000 ug/kg/min | Freq: Once | INTRAVENOUS | Status: AC
  Administered 2014-01-19: 5 ug/kg/min via INTRAVENOUS
  Filled 2014-01-19: qty 100

## 2014-01-19 MED ORDER — IPRATROPIUM-ALBUTEROL 0.5-2.5 (3) MG/3ML IN SOLN
3.0000 mL | Freq: Three times a day (TID) | RESPIRATORY_TRACT | Status: DC
  Administered 2014-01-19 – 2014-02-03 (×43): 3 mL via RESPIRATORY_TRACT
  Filled 2014-01-19 (×44): qty 3

## 2014-01-19 MED ORDER — SODIUM CHLORIDE 0.9 % IV SOLN
INTRAVENOUS | Status: DC
  Administered 2014-01-20 – 2014-01-23 (×4): via INTRAVENOUS
  Administered 2014-01-24: 100 mL/h via INTRAVENOUS
  Administered 2014-01-24: 15:00:00 via INTRAVENOUS
  Administered 2014-01-25: 50 mL/h via INTRAVENOUS

## 2014-01-19 MED ORDER — PANTOPRAZOLE SODIUM 40 MG IV SOLR
40.0000 mg | Freq: Every day | INTRAVENOUS | Status: DC
  Administered 2014-01-19: 40 mg via INTRAVENOUS
  Filled 2014-01-19 (×2): qty 40

## 2014-01-19 MED ORDER — ROCURONIUM BROMIDE 50 MG/5ML IV SOLN
INTRAVENOUS | Status: AC
  Filled 2014-01-19: qty 2

## 2014-01-19 MED ORDER — SUCCINYLCHOLINE CHLORIDE 20 MG/ML IJ SOLN
INTRAMUSCULAR | Status: AC | PRN
  Administered 2014-01-19: 100 mg via INTRAVENOUS

## 2014-01-19 MED ORDER — LORAZEPAM 1 MG PO TABS: 2.0000 mg | ORAL_TABLET | Freq: Once | ORAL | Status: DC

## 2014-01-19 MED ORDER — ETOMIDATE 2 MG/ML IV SOLN
INTRAVENOUS | Status: AC
  Filled 2014-01-19: qty 20

## 2014-01-19 MED ORDER — LIDOCAINE HCL (CARDIAC) 20 MG/ML IV SOLN
INTRAVENOUS | Status: AC
  Filled 2014-01-19: qty 5

## 2014-01-19 NOTE — ED Provider Notes (Signed)
CSN: [id number]     Arrival date & time 01/19/14  1540 History   First MD Initiated Contact with Patient 01/19/14 1604     Chief Complaint  Patient presents with  . Allergic Reaction     (Consider location/radiation/quality/duration/timing/severity/associated sxs/prior Treatment) Patient is a 80 y.o. female presenting with allergic reaction. The history is provided by the EMS personnel.  Allergic Reaction Presenting symptoms: difficulty breathing   Presenting symptoms: no itching and no rash   Difficulty breathing:    Severity:  Severe   Onset quality:  Sudden Severity:  Severe Prior allergic episodes:  No prior episodes Context comment:  Unknown exposure, patient was in attack when difficulty breathign began Relieved by:  None tried Worsened by:  Nothing tried Ineffective treatments:  None tried   Past Medical History  Diagnosis Date  . HTN (hypertension)   . Hyperlipidemia   . Restless leg syndrome   . Osteoporosis   . GERD (gastroesophageal reflux disease)   . COPD (chronic obstructive pulmonary disease)   . Hypertonicity of bladder   . Skin cancer    Past Surgical History  Procedure Laterality Date  . Skin cancer excision    . Esophagogastroduodenoscopy N/A 07/15/2012    Procedure: ESOPHAGOGASTRODUODENOSCOPY (EGD);  Surgeon: Wonda Horner, MD;  Location: [facility] ENDOSCOPY;  Service: Endoscopy;  Laterality: N/A;  . Esophagogastroduodenoscopy N/A 07/22/2012    Procedure: ESOPHAGOGASTRODUODENOSCOPY (EGD);  Surgeon: Missy Sabins, MD;  Location: [facility] ENDOSCOPY;  Service: Endoscopy;  Laterality: N/A;   Family History  Problem Relation Age of Onset  . Emphysema Brother   . Emphysema Brother   . Stroke Sister   . Hypertension Mother   . Breast cancer Sister    History  Substance Use Topics  . Smoking status: Former Smoker -- 1.00 packs/day for 50 years    Types: Cigarettes    Quit date: 01/21/2005  . Smokeless tobacco: Not on file  . Alcohol Use: No   OB History    No  data available     Review of Systems  Unable to perform ROS: Intubated  Skin: Negative for itching and rash.      Allergies  Spiriva handihaler  Home Medications   Prior to Admission medications   Medication Sig Start Date End Date Taking? Authorizing Provider  acetaminophen (TYLENOL) 325 MG tablet Take 2 tablets (650 mg total) by mouth every 6 (six) hours as needed. 07/27/12   Velna Hatchet, MD  Aclidinium Bromide (TUDORZA PRESSAIR) 400 MCG/ACT AEPB Inhale 1 puff into the lungs 2 (two) times daily. 02/11/13   Collene Gobble, MD  albuterol (PROVENTIL HFA;VENTOLIN HFA) 108 (90 BASE) MCG/ACT inhaler Inhale 2 puffs into the lungs every 4 (four) hours as needed for wheezing or shortness of breath. 11/25/13   Collene Gobble, MD  budesonide-formoterol (SYMBICORT) 80-4.5 MCG/ACT inhaler Inhale 2 puffs into the lungs 2 (two) times daily. 12/14/13   Collene Gobble, MD  Calcium Citrate-Vitamin D (CITRACAL + D PO) Take 1 tablet by mouth daily. Cacium 400 mg + Vitamin D (cholecalciferol) 500 I.U.    Historical Provider, MD  iron polysaccharides (NIFEREX) 150 MG capsule Take 1 capsule (150 mg total) by mouth daily. 07/27/12   Velna Hatchet, MD  pantoprazole (PROTONIX) 40 MG tablet Take 1 tablet (40 mg total) by mouth daily at 12 noon. 07/17/12   Velna Hatchet, MD  propranolol (INDERAL) 10 MG tablet Take 10 mg by mouth 2 (two) times daily.     Historical Provider,  MD  rOPINIRole (REQUIP) 0.5 MG tablet Take 1 tablet (0.5 mg total) by mouth at bedtime. 07/27/12   Velna Hatchet, MD  verapamil (CALAN) 120 MG tablet Take 120 mg by mouth once.    Historical Provider, MD   BP 102/54 mmHg  Pulse 64  Temp(Src) 98.1 F (36.7 C) (Core (Comment))  Resp 20  Ht 5\' 5"  (1.651 m)  Wt 148 lb 9.4 oz (67.4 kg)  BMI 24.73 kg/m2  SpO2 98% Physical Exam  Constitutional: She appears well-developed and well-nourished. No distress.  HENT:  Head: Normocephalic and atraumatic.  Right Ear: External ear normal.  Left Ear:  External ear normal.  7.0 ET tube at 23 at the lips  Eyes: Conjunctivae and EOM are normal. Pupils are equal, round, and reactive to light. No scleral icterus.  Patient tracking with eyes  Neck: No JVD present. No tracheal deviation present. No thyromegaly present.  Cardiovascular: Normal rate, regular rhythm and intact distal pulses.  Exam reveals no gallop and no friction rub.   No murmur heard. Pulmonary/Chest:  Mechanical breath sounds equal bilaterally. No stridor noted  Abdominal: Soft. Bowel sounds are normal. She exhibits no distension. There is no tenderness.  Musculoskeletal: Normal range of motion. She exhibits no edema or tenderness.  Neurological: She is alert. No cranial nerve deficit. She exhibits normal muscle tone. Coordination normal.  Alert. Following commands. Tracking with eyes. Noding yes and no to answer questions.   Skin: Skin is warm and dry. She is not diaphoretic. No pallor.  Psychiatric: She expresses no homicidal and no suicidal ideation. She expresses no suicidal plans and no homicidal plans.  Nursing note and vitals reviewed.   ED Course  INTUBATION Date/Time: 01/19/2014 4:44 PM Performed by: Margaretann Loveless Authorized by: Virgel Manifold Consent: The procedure was performed in an emergent situation. Patient identity confirmed: hospital-assigned identification number and provided demographic data Time out: Immediately prior to procedure a "time out" was called to verify the correct patient, procedure, equipment, support staff and site/side marked as required. Indications: respiratory distress Intubation method: video-assisted Patient status: paralyzed (RSI) Sedatives: etomidate Paralytic: succinylcholine Laryngoscope size: glydescope 3. Tube size: 7.5 mm Tube type: cuffed Number of attempts: 1 Cricoid pressure: no Cords visualized: yes Post-procedure assessment: chest rise,  ETCO2 monitor and CO2 detector Breath sounds: equal Cuff inflated: yes ETT  to lip: 23 cm Tube secured with: ETT holder Chest x-ray interpreted by me. Chest x-ray findings: endotracheal tube in appropriate position   (including critical care time) Labs Review Labs Reviewed  CBC WITH DIFFERENTIAL - Abnormal; Notable for the following:    WBC 12.0 (*)    Monocytes Absolute 1.2 (*)    All other components within normal limits  BASIC METABOLIC PANEL - Abnormal; Notable for the following:    Glucose, Bld 173 (*)    Creatinine, Ser 1.26 (*)    GFR calc non Af Amer 38 (*)    GFR calc Af Amer 44 (*)    All other components within normal limits  BRAIN NATRIURETIC PEPTIDE - Abnormal; Notable for the following:    B Natriuretic Peptide 584.6 (*)    All other components within normal limits  URINALYSIS, ROUTINE W REFLEX MICROSCOPIC - Abnormal; Notable for the following:    APPearance CLOUDY (*)    Hgb urine dipstick SMALL (*)    Protein, ur >300 (*)    Nitrite POSITIVE (*)    Leukocytes, UA TRACE (*)    All other components within normal limits  URINE MICROSCOPIC-ADD ON - Abnormal; Notable for the following:    Squamous Epithelial / LPF FEW (*)    Bacteria, UA MANY (*)    Casts HYALINE CASTS (*)    All other components within normal limits  I-STAT CG4 LACTIC ACID, ED - Abnormal; Notable for the following:    Lactic Acid, Venous 2.38 (*)    All other components within normal limits  I-STAT ARTERIAL BLOOD GAS, ED - Abnormal; Notable for the following:    pH, Arterial 7.205 (*)    pCO2 arterial 71.1 (*)    pO2, Arterial 131.0 (*)    Bicarbonate 28.1 (*)    All other components within normal limits  CULTURE, RESPIRATORY (NON-EXPECTORATED)  CULTURE, BLOOD (ROUTINE X 2)  CULTURE, BLOOD (ROUTINE X 2)  CULTURE, EXPECTORATED SPUTUM-ASSESSMENT  URINE CULTURE  CBC  BASIC METABOLIC PANEL  BLOOD GAS, ARTERIAL  MAGNESIUM  PHOSPHORUS  BLOOD GAS, ARTERIAL  TRIGLYCERIDES  BLOOD GAS, ARTERIAL  URINALYSIS, ROUTINE W REFLEX MICROSCOPIC  I-STAT TROPOININ, ED     Imaging Review Dg Chest Portable 1 View  01/19/2014   CLINICAL DATA:  Shortness of breath. History of COPD and hypertension.  EXAM: PORTABLE CHEST - 1 VIEW  COMPARISON:  07/21/2012  FINDINGS: Endotracheal tube is in place. The tip is 2 cm above the carina. NG tube is in the stomach.  Heart is borderline in size. Patchy bilateral airspace disease, right greater than left. This could represent asymmetric edema or infection. No effusions. No acute bony abnormality.  IMPRESSION: Patchy bilateral airspace disease, right greater than left. This could represent asymmetric edema or infection.   Electronically Signed   By: Rolm Baptise M.D.   On: 01/19/2014 16:42     EKG Interpretation None      MDM   Final diagnoses:  SOB (shortness of breath)  Acute respiratory failure with hypoxia    The patient is a 80 y.o. F with hx of COPD who presents with acute hypoxic respiratory failure that acutely started after the patient was in her attack. EMS found the patient cyanotic and intubated her in the field. On arrival to the ED the patient was alert, following commands, and had clear lung sounds. The ET tube was removed and patient placed on non-rebreather at 15L but within two minutes the patient began to have stridor and became hypoxic to 87%. The patient was promptly bagged back to 100% and reintubated. During intubation the vocal cords where noted to be edematous, no swelling of face, lips or tongue is noted. Patient admitted to ICU for further care. Patients family updated on patient status.   Patient seen with attending, Dr. Wilson Singer, who oversaw clinical decision making.   Margaretann Loveless, MD 01/19/14 1811  Virgel Manifold, MD 01/20/14 [phone]

## 2014-01-19 NOTE — H&P (Signed)
PULMONARY / CRITICAL CARE MEDICINE   Name: Tanya Butler MRN: [id number] DOB: October 09, 1926    ADMISSION DATE:  01/19/2014 CONSULTATION DATE:  01/19/2014  REFERRING MD :  EDP  CHIEF COMPLAINT:  Respiratory distress  INITIAL PRESENTATION: 80 year old female who developed stridor and dyspnea after inhaling unknown substance in her attic 01/19/2014. She was intubated upon EMS arrival and subsequently extubated in the emergency department. However, she developed stridor and hypoxia and was once again intubated. PCCM asked to see for admission.  STUDIES:    SIGNIFICANT EVENTS:   HISTORY OF PRESENT ILLNESS:  80 year old female with past medical history as outlined below, which includes COPD (RB patient), hypertension, skin cancer, and GERD. She presented to [facility] emergency department 01/19/2014 with respiratory distress. Earlier that day she was in her attic cleaning was found by her family 15 minutes later, she was having trouble breathing and her lips were noted to be blue. EMS was called and upon their arrival she was noted to have an upper airway stridor, and shortly thereafter became unresponsive. She was intubated in the field and was given 0.3 of epinephrine, Benadryl, and H2 blocker. Upon arrival to emergency department she was awake alert and following commands with a positive cuff leak. She was extubated, but shortly after developed stridor and became hypoxic requiring reintubation. PCCM as been asked to see for admission.  PAST MEDICAL HISTORY :   has a past medical history of HTN (hypertension); Hyperlipidemia; Restless leg syndrome; Osteoporosis; GERD (gastroesophageal reflux disease); COPD (chronic obstructive pulmonary disease); Hypertonicity of bladder; and Skin cancer.  has past surgical history that includes Skin cancer excision; Esophagogastroduodenoscopy (N/A, 07/15/2012); and Esophagogastroduodenoscopy (N/A, 07/22/2012). Prior to Admission medications   Medication Sig Start  Date End Date Taking? Authorizing Provider  acetaminophen (TYLENOL) 325 MG tablet Take 2 tablets (650 mg total) by mouth every 6 (six) hours as needed. 07/27/12   Velna Hatchet, MD  Aclidinium Bromide (TUDORZA PRESSAIR) 400 MCG/ACT AEPB Inhale 1 puff into the lungs 2 (two) times daily. 02/11/13   Collene Gobble, MD  albuterol (PROVENTIL HFA;VENTOLIN HFA) 108 (90 BASE) MCG/ACT inhaler Inhale 2 puffs into the lungs every 4 (four) hours as needed for wheezing or shortness of breath. 11/25/13   Collene Gobble, MD  budesonide-formoterol (SYMBICORT) 80-4.5 MCG/ACT inhaler Inhale 2 puffs into the lungs 2 (two) times daily. 12/14/13   Collene Gobble, MD  Calcium Citrate-Vitamin D (CITRACAL + D PO) Take 1 tablet by mouth daily. Cacium 400 mg + Vitamin D (cholecalciferol) 500 I.U.    Historical Provider, MD  iron polysaccharides (NIFEREX) 150 MG capsule Take 1 capsule (150 mg total) by mouth daily. 07/27/12   Velna Hatchet, MD  pantoprazole (PROTONIX) 40 MG tablet Take 1 tablet (40 mg total) by mouth daily at 12 noon. 07/17/12   Velna Hatchet, MD  propranolol (INDERAL) 10 MG tablet Take 10 mg by mouth 2 (two) times daily.     Historical Provider, MD  rOPINIRole (REQUIP) 0.5 MG tablet Take 1 tablet (0.5 mg total) by mouth at bedtime. 07/27/12   Velna Hatchet, MD  verapamil (CALAN) 120 MG tablet Take 120 mg by mouth once.    Historical Provider, MD   Allergies  Allergen Reactions  . Spiriva Handihaler [Tiotropium Bromide Monohydrate]     REACTION: pt stated made her breathing worse    FAMILY HISTORY:  has no family status information on file.  SOCIAL HISTORY:  reports that she quit smoking about 9 years ago. Her  smoking use included Cigarettes. She has a 50 pack-year smoking history. She does not have any smokeless tobacco history on file. She reports that she does not drink alcohol.  REVIEW OF SYSTEMS:  Unable, intubated  SUBJECTIVE:   VITAL SIGNS: Temp:  [96.1 F (35.6 C)] 96.1 F (35.6 C) (12/30  1619) Pulse Rate:  [59-90] 61 (12/30 1645) Resp:  [15-32] 15 (12/30 1645) BP: (83-171)/(43-77) 113/52 mmHg (12/30 1645) SpO2:  [100 %] 100 % (12/30 1645) FiO2 (%):  [100 %] 100 % (12/30 1604) Weight:  [67.4 kg (148 lb 9.4 oz)] 67.4 kg (148 lb 9.4 oz) (12/30 1619) HEMODYNAMICS:   VENTILATOR SETTINGS: Vent Mode:  [-] PRVC FiO2 (%):  [100 %] 100 % Set Rate:  [15 bmp] 15 bmp Vt Set:  [460 mL] 460 mL PEEP:  [5 cmH20] 5 cmH20 Plateau Pressure:  [10 cmH20] 10 cmH20 INTAKE / OUTPUT: No intake or output data in the 24 hours ending 01/19/14 1654  PHYSICAL EXAMINATION: General:  Elderly female on vent Neuro:  Sedated. +cough/gag HEENT:  [place]/AT, ETT in place, No JVD noted Cardiovascular:  RRR no MRG. Borderline brady Lungs:  Coarse bilateral breath sounds.  Abdomen:  Soft, non-tender, non-distended Musculoskeletal:  No acute deformity or edema.  Skin:  Grossly intact  LABS:  CBC  Recent Labs Lab 01/19/14 1554  WBC 12.0*  HGB 13.9  HCT 43.7  PLT 248   Coag's No results for input(s): APTT, INR in the last 168 hours. BMET  Recent Labs Lab 01/19/14 1554  NA 139  K 5.0  CL 106  CO2 26  BUN 15  CREATININE 1.26*  GLUCOSE 173*   Electrolytes  Recent Labs Lab 01/19/14 1554  CALCIUM 9.1   Sepsis Markers  Recent Labs Lab 01/19/14 1607  LATICACIDVEN 2.38*   ABG No results for input(s): PHART, PCO2ART, PO2ART in the last 168 hours. Liver Enzymes No results for input(s): AST, ALT, ALKPHOS, BILITOT, ALBUMIN in the last 168 hours. Cardiac Enzymes No results for input(s): TROPONINI, PROBNP in the last 168 hours. Glucose No results for input(s): GLUCAP in the last 168 hours.  Imaging No results found.   ASSESSMENT / PLAN:  PULMONARY OETT 12/30> A: Acute hypoxemic respiratory failure 2nd. ? Etiology (inhalation injury vs angioedema vs VCD) Stridor H/o COPD  P:   Full vent support, lung protective ventilation Check CXR F/u ABG VAP prevention per  protocol Nebulize BD's and steroids.  Decadron Ranitidine Holding outpatient symbicort, tudorza  CARDIOVASCULAR A:  Hypotension, likely medication related.  Elevated lactic acid BNP elevation - 06/2012 echo with LVEF   P:  Tele Give volume Repeat lactic D/C verapamil Holding propranolol  RENAL A:   AKI  P:   Volume  Repeat Bmet Monitor UOP  GASTROINTESTINAL A:   H/o GERD  P:   NPO SUP: IV protonix  HEMATOLOGIC A:   No acute issues  P:  Follow CBC Heparin for VTE ppx  INFECTIOUS A:   Leukocytosis CAP vs aspiration  P:   BCx2 12/30> UC 12/30 > Sputum 12/30 > Abx: Ceftriaxone 12/30 > Abx: Azithromycin 12/30 >  ENDOCRINE A:   Hyperglycemia without history of DM    P:   CBG and SSI  NEUROLOGIC A:   Acute metabolic encephalopathy  P:   RASS goal: -1 PAD 3 with propofol gtt for sedation Monitor   FAMILY  - Updates: Niece (HCPOA) and friend update.   - Inter-disciplinary family meet or Palliative Care meeting due by: 01/25/2014  Georgann Housekeeper, AGACNP-BC [place] Pulmonology/Critical Care Pager [phone] or [phone]  Likely an aspiration event as a result of multiple attempts at intubation.  Pan culture.  Full vent support.  Levaquin started.  ?vocal cord edema on second intubation.  Will start steroids, benadryl and H2 blockade.  Hold ACE inhibitor ?ace induced edema.  Unsure.  IVF for hypotension.  Repeat CXR in AM.  Diffuse crackles.  ?need for diureses in AM.  The patient is critically ill with multiple organ systems failure and requires high complexity decision making for assessment and support, frequent evaluation and titration of therapies, application of advanced monitoring technologies and extensive interpretation of multiple databases.   Critical Care Time devoted to patient care services described in this note is  45  Minutes. This time reflects time of care of this signee Dr Jennet Maduro. This critical care time does not  reflect procedure time, or teaching time or supervisory time of PA/NP/Med student/Med Resident etc but could involve care discussion time.  Rush Farmer, M.D. [facility] Pulmonary/Critical Care Medicine. Pager: [phone]. After hours pager: [phone].

## 2014-01-19 NOTE — Code Documentation (Addendum)
Pt oxygen dropped, plan to reintubate patient. Laboring breathing noted.

## 2014-01-19 NOTE — Progress Notes (Signed)
ANTIBIOTIC CONSULT NOTE - INITIAL  Pharmacy Consult for levaquin Indication: pneumonia  Allergies  Allergen Reactions  . Spiriva Handihaler [Tiotropium Bromide Monohydrate]     REACTION: pt stated made her breathing worse    Patient Measurements: Height: 5\' 5"  (165.1 cm) Weight: 148 lb 9.4 oz (67.4 kg) IBW/kg (Calculated) : 57  Vital Signs: Temp: 98.1 F (36.7 C) (12/30 1714) Temp Source: Core (Comment) (12/30 1714) BP: 102/54 mmHg (12/30 1800) Pulse Rate: 64 (12/30 1800) Intake/Output from previous day:   Intake/Output from this shift: Total I/O In: -  Out: 10 [Urine:10]  Labs:  Recent Labs  01/19/14 1554  WBC 12.0*  HGB 13.9  PLT 248  CREATININE 1.26*   Estimated Creatinine Clearance: 29.4 mL/min (by C-G formula based on Cr of 1.26). No results for input(s): VANCOTROUGH, VANCOPEAK, VANCORANDOM, GENTTROUGH, GENTPEAK, GENTRANDOM, TOBRATROUGH, TOBRAPEAK, TOBRARND, AMIKACINPEAK, AMIKACINTROU, AMIKACIN in the last 72 hours.   Microbiology: No results found for this or any previous visit (from the past 720 hour(s)).  Medical History: Past Medical History  Diagnosis Date  . HTN (hypertension)   . Hyperlipidemia   . Restless leg syndrome   . Osteoporosis   . GERD (gastroesophageal reflux disease)   . COPD (chronic obstructive pulmonary disease)   . Hypertonicity of bladder   . Skin cancer    Assessment: Tanya Butler with hx of COPD admitted with respiratory distress required intubation. Pharmacy is consulted to start levaquin empiric for pneumonia. She is afebrile, wbc mildly elevated 12k. Scr 1.26, est. crcl ~ 30 ml/min. Respiratory culture pending  Plan:  - Levaquin 750 mg IV Q 48 hrs - f/u cultures and monitor renal function.  Maryanna Shape, PharmD, BCPS  Clinical Pharmacist  Pager: [phone]   01/19/2014,6:07 PM

## 2014-01-19 NOTE — ED Notes (Addendum)
Pt extubated by Dr. Rosezetta Schlatter. Pt is alert, following commands.

## 2014-01-19 NOTE — ED Notes (Signed)
Critical Care at bedside.  

## 2014-01-19 NOTE — Progress Notes (Signed)
Chaplain responded to page.  Chaplain escorted pt's niece and family friend to consult room and facilitated MD consult with family.  Family and friend also escorted to room then shown way to ICU waiting area.  Pt's niece shares that she has a copy of HCPOA at home.  Chaplain advised she bring a copy to keep with pt's record.  Chaplain provided emotional support as well as ministry of presence, hospitality and empathetic listening.  Chaplain to follow up as needed.    01/19/14 1630  Clinical Encounter Type  Visited With Patient;Family  Visit Type Initial;Critical Care;ED  Referral From Care management  Spiritual Encounters  Spiritual Needs Emotional  Stress Factors  Patient Stress Factors None identified  Family Stress Factors Family relationships;Health changes;Major life changes  Advance Directives (For Healthcare)  Does patient have an advance directive? Yes  Type of Advance Directive Healthcare Power of Attorney  Copy of advanced directive(s) in chart? No - copy requested   Geralyn Flash 01/19/2014

## 2014-01-19 NOTE — ED Notes (Addendum)
Per EMS- Pt comes from home, went up to attic was found 15 mins later with trouble breathing and her lips were blue. EMS noted upper airway stridor, when loading into truck pt went LOC deteriorated and became unresponsive. EMS began bagging, and gave albuterol. Pt was then intubated with 7.0 ETT at 23 at lip. Given 0.3 EPI, 50 benadryl, 50 zantac. Pt following commands upon arrival is alert.

## 2014-01-19 NOTE — ED Provider Notes (Signed)
I saw and evaluated the patient, reviewed the resident's note and I agree with the findings and plan.   EKG Interpretation None      87yF with respiratory distress. Found in her attic by family. Apparently had gone up there to clean? Found by family about 15 minutes later and appeared blue and struggling to breath. EMS reports that attic with loose blown in insulation. Stridor for EMS. Received 0.3mg  1:1,000 epi, 50mg  benadryl, 50 zantac. Pt then became increasingly confused then quickly unresponsive. Intubated w/o meds. Did not lose pulses. EMS reports did not notice obvious airway edema. Bagged easily. Shortly after being intubated she became responsive again. On arrival to ED she was awake. Following commands. Shaking head yes/no. 02 sats normal albeit with supplemental 02. Lungs clear. Adequate spontaneous effort. cuff leak. Made decision to extubate in resus bay to NRB. Stridor immediately noted. Initially planned some racemic epi and solumderol and see how she did. Became increasingly tachypneic. Then desaturated to 80s. Had to be re-intubated. Admit to CCM.  CRITICAL CARE Performed by: Virgel Manifold Total critical care time: 35 minutes Critical care time was exclusive of separately billable procedures and treating other patients. Critical care was necessary to treat or prevent imminent or life-threatening deterioration. Critical care was time spent personally by me on the following activities: development of treatment plan with patient and/or surrogate as well as nursing, discussions with consultants, evaluation of patient's response to treatment, examination of patient, obtaining history from patient or surrogate, ordering and performing treatments and interventions, ordering and review of laboratory studies, ordering and review of radiographic studies, pulse oximetry and re-evaluation of patient's condition.   Virgel Manifold, MD 01/20/14 1400

## 2014-01-19 NOTE — ED Notes (Signed)
NOTIFIED DR. Wilson Singer IN PERSON FOR PATIENTS LAB RESULTS OF CG4+ LACTIC ACID =2.38 ,I-STAT TROPONIN =0.01 @16 :18 PM ,01/19/2014.

## 2014-01-20 ENCOUNTER — Inpatient Hospital Stay (HOSPITAL_COMMUNITY): Payer: Medicare Other

## 2014-01-20 LAB — BLOOD GAS, ARTERIAL
Acid-base deficit: 1.4 mmol/L (ref 0.0–2.0)
Bicarbonate: 22.5 mEq/L (ref 20.0–24.0)
DRAWN BY: 41881
FIO2: 0.4 %
MECHVT: 460 mL
O2 Saturation: 95.9 %
PEEP: 5 cmH2O
Patient temperature: 98.6
RATE: 20 resp/min
TCO2: 23.6 mmol/L (ref 0–100)
pCO2 arterial: 36 mmHg (ref 35.0–45.0)
pH, Arterial: 7.413 (ref 7.350–7.450)
pO2, Arterial: 78.6 mmHg — ABNORMAL LOW (ref 80.0–100.0)

## 2014-01-20 LAB — CBC
HCT: 31.8 % — ABNORMAL LOW (ref 36.0–46.0)
HEMOGLOBIN: 10.5 g/dL — AB (ref 12.0–15.0)
MCH: 31.3 pg (ref 26.0–34.0)
MCHC: 33 g/dL (ref 30.0–36.0)
MCV: 94.9 fL (ref 78.0–100.0)
Platelets: 171 10*3/uL (ref 150–400)
RBC: 3.35 MIL/uL — AB (ref 3.87–5.11)
RDW: 14.9 % (ref 11.5–15.5)
WBC: 8.7 10*3/uL (ref 4.0–10.5)

## 2014-01-20 LAB — BASIC METABOLIC PANEL
Anion gap: 4 — ABNORMAL LOW (ref 5–15)
BUN: 15 mg/dL (ref 6–23)
CALCIUM: 7.8 mg/dL — AB (ref 8.4–10.5)
CHLORIDE: 110 meq/L (ref 96–112)
CO2: 24 mmol/L (ref 19–32)
Creatinine, Ser: 0.89 mg/dL (ref 0.50–1.10)
GFR calc non Af Amer: 57 mL/min — ABNORMAL LOW (ref >90)
GFR, EST AFRICAN AMERICAN: 67 mL/min — AB (ref >90)
Glucose, Bld: 132 mg/dL — ABNORMAL HIGH (ref 70–99)
POTASSIUM: 3.7 mmol/L (ref 3.5–5.1)
SODIUM: 138 mmol/L (ref 135–145)

## 2014-01-20 LAB — MAGNESIUM
Magnesium: 1.9 mg/dL (ref 1.5–2.5)
Magnesium: 1.8 mg/dL (ref 1.5–2.5)

## 2014-01-20 LAB — TROPONIN I
Troponin I: <0.03 ng/mL (ref <0.031)
Troponin I: <0.03 ng/mL (ref <0.031)

## 2014-01-20 LAB — GLUCOSE, CAPILLARY
GLUCOSE-CAPILLARY: 122 mg/dL — AB (ref 70–99)
GLUCOSE-CAPILLARY: 114 mg/dL — AB (ref 70–99)
GLUCOSE-CAPILLARY: 134 mg/dL — AB (ref 70–99)
Glucose-Capillary: 137 mg/dL — ABNORMAL HIGH (ref 70–99)
Glucose-Capillary: 138 mg/dL — ABNORMAL HIGH (ref 70–99)
Glucose-Capillary: 152 mg/dL — ABNORMAL HIGH (ref 70–99)

## 2014-01-20 LAB — PHOSPHORUS
PHOSPHORUS: 1.7 mg/dL — AB (ref 2.3–4.6)
PHOSPHORUS: 3.8 mg/dL (ref 2.3–4.6)

## 2014-01-20 LAB — PROCALCITONIN: Procalcitonin: 0.31 ng/mL

## 2014-01-20 LAB — SEDIMENTATION RATE: Sed Rate: 11 mm/hr (ref 0–22)

## 2014-01-20 LAB — RHEUMATOID FACTOR: Rhuematoid fact SerPl-aCnc: <10 IU/mL (ref <=14)

## 2014-01-20 MED ORDER — VITAL HIGH PROTEIN PO LIQD
1000.0000 mL | ORAL | Status: DC
  Administered 2014-01-20 – 2014-01-21 (×2): 1000 mL
  Filled 2014-01-20 (×3): qty 1000

## 2014-01-20 MED ORDER — SODIUM CHLORIDE 0.9 % IV BOLUS (SEPSIS)
500.0000 mL | Freq: Once | INTRAVENOUS | Status: AC
  Administered 2014-01-20: 500 mL via INTRAVENOUS

## 2014-01-20 MED ORDER — PRO-STAT SUGAR FREE PO LIQD
30.0000 mL | Freq: Two times a day (BID) | ORAL | Status: AC
  Administered 2014-01-20 (×2): 30 mL
  Filled 2014-01-20 (×2): qty 30

## 2014-01-20 MED ORDER — DEXMEDETOMIDINE HCL IN NACL 200 MCG/50ML IV SOLN
0.0000 ug/kg/h | INTRAVENOUS | Status: AC
  Administered 2014-01-20 (×2): 1.2 ug/kg/h via INTRAVENOUS
  Administered 2014-01-20: 0.5 ug/kg/h via INTRAVENOUS
  Administered 2014-01-21 (×2): 1.2 ug/kg/h via INTRAVENOUS
  Administered 2014-01-21 (×2): 1 ug/kg/h via INTRAVENOUS
  Administered 2014-01-21 (×3): 1.2 ug/kg/h via INTRAVENOUS
  Administered 2014-01-21: 1 ug/kg/h via INTRAVENOUS
  Administered 2014-01-22: 0.402 ug/kg/h via INTRAVENOUS
  Administered 2014-01-22 (×2): 1 ug/kg/h via INTRAVENOUS
  Administered 2014-01-22: 0.402 ug/kg/h via INTRAVENOUS
  Administered 2014-01-22: 0.4 ug/kg/h via INTRAVENOUS
  Administered 2014-01-23: 0.861 ug/kg/h via INTRAVENOUS
  Administered 2014-01-23 (×2): 0.6 ug/kg/h via INTRAVENOUS
  Administered 2014-01-23: 0.8 ug/kg/h via INTRAVENOUS
  Filled 2014-01-20 (×20): qty 50

## 2014-01-20 MED ORDER — HALOPERIDOL LACTATE 5 MG/ML IJ SOLN
5.0000 mg | INTRAMUSCULAR | Status: AC
  Administered 2014-01-20: 5 mg via INTRAVENOUS

## 2014-01-20 MED ORDER — POTASSIUM PHOSPHATES 15 MMOLE/5ML IV SOLN
30.0000 mmol | Freq: Once | INTRAVENOUS | Status: AC
  Administered 2014-01-20: 30 mmol via INTRAVENOUS
  Filled 2014-01-20: qty 10

## 2014-01-20 MED ORDER — HALOPERIDOL LACTATE 5 MG/ML IJ SOLN
INTRAMUSCULAR | Status: AC
  Filled 2014-01-20: qty 1

## 2014-01-20 MED ORDER — FUROSEMIDE 10 MG/ML IJ SOLN
20.0000 mg | Freq: Once | INTRAMUSCULAR | Status: AC
  Administered 2014-01-20: 20 mg via INTRAVENOUS
  Filled 2014-01-20: qty 2

## 2014-01-20 MED ORDER — MIDAZOLAM HCL 2 MG/2ML IJ SOLN
1.0000 mg | INTRAMUSCULAR | Status: DC | PRN
  Administered 2014-01-20: 1 mg via INTRAVENOUS
  Administered 2014-01-20 (×2): 2 mg via INTRAVENOUS
  Administered 2014-01-21: 4 mg via INTRAVENOUS
  Administered 2014-01-22 – 2014-01-27 (×6): 2 mg via INTRAVENOUS
  Filled 2014-01-20: qty 4
  Filled 2014-01-20 (×3): qty 2
  Filled 2014-01-20 (×5): qty 4
  Filled 2014-01-20 (×3): qty 2
  Filled 2014-01-20 (×5): qty 4
  Filled 2014-01-20 (×2): qty 2
  Filled 2014-01-20 (×2): qty 4

## 2014-01-20 NOTE — Progress Notes (Signed)
[facility] Progress Note Patient Name: KARENANN MCGRORY DOB: Apr 06, 1926 MRN: [id number]   Date of Service  01/20/2014  HPI/Events of Note  Hypokalemia and hypophos  eICU Interventions  Potassium and phos replaced     Intervention Category Intermediate Interventions: Electrolyte abnormality - evaluation and management  Sly Parlee 01/20/2014, 4:45 AM

## 2014-01-20 NOTE — Care Management Note (Addendum)
    Page 1 of 2   01/31/2014     11:51:23 AM CARE MANAGEMENT NOTE 01/31/2014  Patient:  Tanya Butler, Tanya Butler   Account Number:  [id number]  Date Initiated:  01/20/2014  Documentation initiated by:  Elissa Hefty  Subjective/Objective Assessment:   adm w resp distress, vent     Action/Plan:   lives at home, pcp dr Velna Hatchet   Anticipated DC Date:  01/28/2014   Anticipated DC Plan:  LONG TERM ACUTE CARE (LTAC)      [place]  CM consult      Choice offered to / List presented to:             Status of service:  Completed, signed off Medicare Important Message given?  YES (If response is "NO", the following Medicare IM given date fields will be blank) Date Medicare IM given:  01/31/2014 Medicare IM given by:  [facility] Date Additional Medicare IM given:   Additional Medicare IM given by:    Discharge Disposition:    Per UR Regulation:  Reviewed for med. necessity/level of care/duration of stay  If discussed at [place] of Stay Meetings, dates discussed:   01/25/2014  01/27/2014    Comments:  ContactDolores Lory Niece Crawford Niece [phone]  [phone]  01-31-14  11:50am Luz Lex, RNBSN [phone] Returned to ICU - due to Afib with RVR - cardioverted - placed back on vent overnight.  Now back on trach collar - 40% and tolerating.  Has tube feeding - speech to eval swallowing.  SW following for SNF placement.  01-26-14 11:73m Luz Lex, RNBSN [phone] patient awake sitting up in chair.  Tolerating trach collar.  Lives at home alone. Wants to go to rehab post discharge.  Interested in English as a second language teacher - but understands insurance criteria needs need to remain on vent - already on trach collar and doing well.  Understands SNF with rehab that takes trach is option.  SW referral made.    01-25-14 2:40pm [place] [phone] Intubated 4 times this admission.  Trached today.  Checking Ltach benefits. [facility] - no - no  Ltach benefit in network.

## 2014-01-20 NOTE — Progress Notes (Signed)
INITIAL NUTRITION ASSESSMENT  DOCUMENTATION CODES Per approved criteria  -Not Applicable   INTERVENTION: Initiate TF via OG tube with Vital High Protein at 25 ml/h and Prostat 30 ml BID on day 1; on day 2, increase to goal rate of 50 ml/h (1200 ml per day) and discontinue Pro-stat to provide 1200 kcals, 105 gm protein, 1008 ml free water daily.   NUTRITION DIAGNOSIS: Inadequate oral intake related to acute respiratory failure as evidenced by NPO/Vent status.   Goal: Pt to meet >/= 90% of their estimated nutrition needs   Monitor:  TF initiation/tolerance, Vent status, weight trend, labs  Reason for Assessment: Vent  80 y.o. female  Admitting Dx: <principal problem not specified>  ASSESSMENT: 80 year old female with past medical history of COPD, hypertension, skin cancer, and GERD who developed stridor and dyspnea after inhaling unknown substance in her attic 01/19/2014. She was intubated upon EMS arrival and subsequently extubated in the emergency department. However, she developed stridor and hypoxia and was once again intubated.  Pt intubated with OG tube to suction. She appears well-nourished with no evidence of weight loss per weight history.   Patient is currently intubated on ventilator support MV: 7.8 L/min Temp (24hrs), Avg:97.7 F (36.5 C), Min:96.1 F (35.6 C), Max:98.3 F (36.8 C)  Propofol: not currently infusing   Height: Ht Readings from Last 1 Encounters:  01/19/14 5\' 2"  (1.575 m)    Weight: Wt Readings from Last 1 Encounters:  01/20/14 153 lb 10.6 oz (69.7 kg)    Ideal Body Weight: 110 lbs  % Ideal Body Weight: 139%  Wt Readings from Last 10 Encounters:  01/20/14 153 lb 10.6 oz (69.7 kg)  11/25/13 148 lb 9.6 oz (67.405 kg)  08/27/13 142 lb (64.411 kg)  02/11/13 139 lb (63.05 kg)  01/07/13 138 lb 6.4 oz (62.778 kg)  07/22/12 141 lb 8.6 oz (64.2 kg)  07/16/12 142 lb 12 oz (64.751 kg)  05/02/11 144 lb 9.6 oz (65.59 kg)  10/09/10 143 lb 9.6  oz (65.137 kg)  04/06/10 154 lb 4 oz (69.967 kg)    Usual Body Weight: unknown  % Usual Body Weight: NA  BMI:  Body mass index is 28.1 kg/(m^2).  Estimated Nutritional Needs: Kcal: 1230 Protein: 100-110 grams Fluid: 2.2 L/day  Skin: intact  Diet Order: Diet NPO time specified  EDUCATION NEEDS: -No education needs identified at this time   Intake/Output Summary (Last 24 hours) at 01/20/14 0918 Last data filed at 01/20/14 0800  Gross per 24 hour  Intake 2967.59 ml  Output    730 ml  Net 2237.59 ml    Last BM: PTA  Labs:   Recent Labs Lab 01/19/14 1554 01/20/14 0237  NA 139 138  K 5.0 3.7  CL 106 110  CO2 26 24  BUN 15 15  CREATININE 1.26* 0.89  CALCIUM 9.1 7.8*  MG  --  1.9  PHOS  --  1.7*  GLUCOSE 173* 132*    CBG (last 3)   Recent Labs  01/19/14 1926 01/20/14 0017 01/20/14 0359  GLUCAP 143* 152* 122*    Scheduled Meds: . antiseptic oral rinse  7 mL Mouth Rinse QID  . budesonide  0.25 mg Nebulization BID  . chlorhexidine  15 mL Mouth Rinse BID  . diphenhydrAMINE  25 mg Intravenous Q8H  . famotidine (PEPCID) IV  20 mg Intravenous Q24H  . heparin  5,000 Units Subcutaneous 3 times per day  . insulin aspart  2-6 Units Subcutaneous 6 times per  day  . ipratropium-albuterol  3 mL Nebulization TID  . levofloxacin (LEVAQUIN) IV  750 mg Intravenous Q48H  . pantoprazole (PROTONIX) IV  40 mg Intravenous QHS  . potassium phosphate IVPB (mmol)  30 mmol Intravenous Once    Continuous Infusions: . sodium chloride    . sodium chloride    . propofol 12.5 mcg/kg/min (01/20/14 0800)    Past Medical History  Diagnosis Date  . HTN (hypertension)   . Hyperlipidemia   . Restless leg syndrome   . Osteoporosis   . GERD (gastroesophageal reflux disease)   . COPD (chronic obstructive pulmonary disease)   . Hypertonicity of bladder   . Skin cancer     Past Surgical History  Procedure Laterality Date  . Skin cancer excision    .  Esophagogastroduodenoscopy N/A 07/15/2012    Procedure: ESOPHAGOGASTRODUODENOSCOPY (EGD);  Surgeon: Wonda Horner, MD;  Location: [facility] ENDOSCOPY;  Service: Endoscopy;  Laterality: N/A;  . Esophagogastroduodenoscopy N/A 07/22/2012    Procedure: ESOPHAGOGASTRODUODENOSCOPY (EGD);  Surgeon: Missy Sabins, MD;  Location[facility] ENDOSCOPY;  Service: Endoscopy;  Laterality: N/A;    Pryor Ochoa RD, LDN Inpatient Clinical Dietitian Pager: [phone] After Hours Pager: [phone]

## 2014-01-20 NOTE — Progress Notes (Signed)
RT pulled tube 2 cm (as tube had been pulled out 1 already). Bilateral breath sounds heard. Tube secured at 21.

## 2014-01-20 NOTE — Progress Notes (Signed)
   CT chest revuiew  -  Has pneumonia - Rx levaquin ongoing , wil contrinue  - HAs evid of CHF NOS  - trop x 1 negative, Will recheck. Rx lasix x 1. Check ECHO   - Agitated - add versed  - ET tube too low - wil retract  Dr. Brand Males, M.D., [facility].C.P Pulmonary and Critical Care Medicine Staff Physician [place] [facility] Pager: [phone], If no answer or between  15:00h - 7:00h: call [phone]  01/20/2014 3:44 PM

## 2014-01-20 NOTE — Progress Notes (Signed)
Agittated despite precdex  Plan Haldol 5mg  IV x 1 stat (qtc 400s per RN on monitor)  Dr. Brand Males, M.D., [facility].C.P Pulmonary and Critical Care Medicine Staff Physician [place] [facility] Pager: [phone], If no answer or between  15:00h - 7:00h: call [phone]  01/20/2014 7:20 PM

## 2014-01-20 NOTE — Progress Notes (Signed)
Tube feeding started after tube pulled back.

## 2014-01-20 NOTE — H&P (Signed)
PULMONARY / CRITICAL CARE MEDICINE   Name: Tanya Butler MRN: [id number] DOB: [birth year]    ADMISSION DATE:  01/19/2014 CONSULTATION DATE:  01/19/2014  REFERRING MD :  EDP  CHIEF COMPLAINT:  Respiratory distress  INITIAL PRESENTATION:  80 year old female with past medical history as outlined below, which includes COPD (RB patient), hypertension, skin cancer, and GERD. She presented to [facility] emergency department 01/19/2014 with respiratory distress. Earlier that day she was in her attic cleaning was found by her family 15 minutes later, she was having trouble breathing and her lips were noted to be blue. EMS was called and upon their arrival she was noted to have an upper airway stridor, and shortly thereafter became unresponsive. She was intubated in the field and was given 0.3 of epinephrine, Benadryl, and H2 blocker. Upon arrival to emergency department she was awake alert and following commands with a positive cuff leak. She was extubated, but shortly after developed stridor and became hypoxic requiring reintubation. PCCM as been asked to see for admission.   has a past medical history of HTN (hypertension); Hyperlipidemia; Restless leg syndrome; Osteoporosis; GERD (gastroesophageal reflux disease); COPD (chronic obstructive pulmonary disease); Hypertonicity of bladder; and Skin cancer.  has past surgical history that includes Skin cancer excision; Esophagogastroduodenoscopy (N/A, 07/15/2012); and Esophagogastroduodenoscopy (N/A, 07/22/2012).   has past surgical history that includes Skin cancer excision; Esophagogastroduodenoscopy (N/A, 07/15/2012); and Esophagogastroduodenoscopy (N/A, 07/22/2012).   reports that she quit smoking about 9 years ago. Her smoking use included Cigarettes. She has a 50 pack-year smoking history. She does not have any smokeless tobacco history on file.    SIGNIFICANT EVENTS: 01/19/2014 -admt    SUBJECTIVE/OVERNIGHT/INTERVAL HX 01/20/14 -  Patient  And  family denied to RN that there was dust exposure. Patient went upto attic and just got very dyspneic. Failed SBT earlier.   VITAL SIGNS: Temp:  [96.1 F (35.6 C)-98.3 F (36.8 C)] 98 F (36.7 C) (12/31 0800) Pulse Rate:  [55-90] 83 (12/31 0930) Resp:  [15-32] 20 (12/31 0930) BP: (72-171)/(35-87) 113/52 mmHg (12/31 0930) SpO2:  [96 %-100 %] 100 % (12/31 0930) FiO2 (%):  [40 %-100 %] 40 % (12/31 0809) Weight:  [67.4 kg (148 lb 9.4 oz)-69.7 kg (153 lb 10.6 oz)] 69.7 kg (153 lb 10.6 oz) (12/31 0500) HEMODYNAMICS:   VENTILATOR SETTINGS: Vent Mode:  [-] PRVC FiO2 (%):  [40 %-100 %] 40 % Set Rate:  [15 bmp-20 bmp] 20 bmp Vt Set:  [460 mL] 460 mL PEEP:  [5 cmH20] 5 cmH20 Plateau Pressure:  [10 cmH20-25 cmH20] 25 cmH20 INTAKE / OUTPUT:  Intake/Output Summary (Last 24 hours) at 01/20/14 1011 Last data filed at 01/20/14 0900  Gross per 24 hour  Intake 3152.59 ml  Output    730 ml  Net 2422.59 ml    PHYSICAL EXAMINATION: General:  Elderly female on vent Neuro:  On low dose diprivan - > RASS +1. CAM-ICU negative for delirium. Moves all 4s HEENT:  [place]/AT, ETT in place, No JVD noted Cardiovascular:  RRR no MRG. Borderline brady Lungs:  Coarse bilateral breath sounds. No wheeze Abdomen:  Soft, non-tender, non-distended Musculoskeletal:  No acute deformity or edema.  Skin:  Grossly intact  LABS: PULMONARY  Recent Labs Lab 01/19/14 1706 01/19/14 2002 01/20/14 0312  PHART 7.205* 7.304* 7.413  PCO2ART 71.1* 53.0* 36.0  PO2ART 131.0* 104.0* 78.6*  HCO3 28.1* 25.5* 22.5  TCO2 30 27.1 23.6  O2SAT 98.0 97.1 95.9    CBC  Recent Labs Lab 01/19/14 1554  01/20/14 0237  HGB 13.9 10.5*  HCT 43.7 31.8*  WBC 12.0* 8.7  PLT 248 171    COAGULATION No results for input(s): INR in the last 168 hours.  CARDIAC  No results for input(s): TROPONINI in the last 168 hours. No results for input(s): PROBNP in the last 168 hours.   CHEMISTRY  Recent Labs Lab 01/19/14 1554  01/20/14 0237  NA 139 138  K 5.0 3.7  CL 106 110  CO2 26 24  GLUCOSE 173* 132*  BUN 15 15  CREATININE 1.26* 0.89  CALCIUM 9.1 7.8*  MG  --  1.9  PHOS  --  1.7*   Estimated Creatinine Clearance: 42.2 mL/min (by C-G formula based on Cr of 0.89).   LIVER No results for input(s): AST, ALT, ALKPHOS, BILITOT, PROT, ALBUMIN, INR in the last 168 hours.   INFECTIOUS  Recent Labs Lab 01/19/14 1607  LATICACIDVEN 2.38*     ENDOCRINE CBG (last 3)   Recent Labs  01/19/14 1926 01/20/14 0017 01/20/14 0359  GLUCAP 143* 152* 122*         IMAGING x48h Dg Chest Port 1 View  01/20/2014   CLINICAL DATA:  Re-evaluate airspace disease, acute respiratory failure  EXAM: PORTABLE CHEST - 1 VIEW  COMPARISON:  Portable chest x-ray of January 19, 2014  FINDINGS: The lungs are hypoinflated. The endotracheal tube tip lies 1.8 cm above the crotch of the carina. The pulmonary interstitial markings are less conspicuous today but remain increased. The cardiopericardial silhouette is top-normal in size. The pulmonary vascularity is less engorged. The esophagogastric tube project below the inferior margin of the image.  IMPRESSION: There has been slight interval improvement in the appearance of the pulmonary interstitium which may reflect some resolving interstitial edema. The endotracheal tube tip lies 1.8 cm above the crotch of the carina. Withdrawal by 1 2 cm is recommended to avoid accidental mainstem bronchus intubation.   Electronically Signed   By: David  Martinique   On: 01/20/2014 07:37   Dg Chest Portable 1 View  01/19/2014   CLINICAL DATA:  Shortness of breath. History of COPD and hypertension.  EXAM: PORTABLE CHEST - 1 VIEW  COMPARISON:  07/21/2012  FINDINGS: Endotracheal tube is in place. The tip is 2 cm above the carina. NG tube is in the stomach.  Heart is borderline in size. Patchy bilateral airspace disease, right greater than left. This could represent asymmetric edema or infection. No  effusions. No acute bony abnormality.  IMPRESSION: Patchy bilateral airspace disease, right greater than left. This could represent asymmetric edema or infection.   Electronically Signed   By: Rolm Baptise M.D.   On: 01/19/2014 16:42       ASSESSMENT / PLAN:  PULMONARY OETT 12/30> A: Baseline Hx of COPD NOS - AFL on spiro - follows with [facility] ? Baseline CXR July 2015 with ?? ILD change  Current  - Acute hypoxemic respiratory failure 2nd. ? Etiology (inhalation injury vs angioedema vs VCD) with Stridor  After extubation in ER  - 01/20/14 - denies inhalation injury.  Failed SBT  P:   Check HRCT ruled out ILD Autpimmune panel  Full vent support, lung protective ventilation SBT as tolerated Check CXR DC benadryl, Hold steroids till CT info obtained F/u ABG VAP prevention per protocol Nebulize BD's and steroids.  Ranitidine   CARDIOVASCULAR A:  Hypotension, likely medication related.  Elevated lactic acid BNP elevation - 06/2012 echo with LVEF    - normal bp/hr  P:  Tele Repeat lactic D/C verapamil Holding propranolol  RENAL A:   Low phos - repleted by elink  P:   monitopr Bmet Monitor UOP  GASTROINTESTINAL A:   H/o GERD  P:   STart TF SUP: IV pepcid  HEMATOLOGIC A:   No acute issues  P:  Follow CBC Heparin for VTE ppx  INFECTIOUS A:   Leukocytosis CAP vs aspiration   - trach aspirate with GPC P:   Check PCT and if normal DC abx BCx2 12/30> UC 12/30 > Sputum 12/30 > Abx: Ceftriaxone 12/30 > Abx: Azithromycin 12/30 >  ENDOCRINE A:   Hyperglycemia without history of DM    P:   CBG and SSI  NEUROLOGIC A:   Acute metabolic encephalopathy   - normal WUA on dipriva  P:   RASS goal: -1 to 0 PAD 3 with propofol gtt for sedation Monitor   FAMILY  - Updates: Niece (HCPOA) and friend updated ad at admit 01/19/2014. Patient updated 01/20/2014. No family at bedside   - Inter-disciplinary family meet or Palliative Care meeting due  by: 01/25/2014     The patient is critically ill with multiple organ systems failure and requires high complexity decision making for assessment and support, frequent evaluation and titration of therapies, application of advanced monitoring technologies and extensive interpretation of multiple databases.   Critical Care Time devoted to patient care services described in this note is  30  Minutes. This time reflects time of care of this signee Dr Brand Males. This critical care time does not reflect procedure time, or teaching time or supervisory time of PA/NP/Med student/Med Resident etc but could involve care discussion time    Dr. Brand Males, M.D., [facility].C.P Pulmonary and Critical Care Medicine Staff Physician [place] [facility] Pager: [phone], If no answer or between  15:00h - 7:00h: call [phone]  01/20/2014 10:26 AM

## 2014-01-21 ENCOUNTER — Inpatient Hospital Stay (HOSPITAL_COMMUNITY): Payer: Medicare Other

## 2014-01-21 DIAGNOSIS — I359 Nonrheumatic aortic valve disorder, unspecified: Secondary | ICD-10-CM

## 2014-01-21 LAB — GLUCOSE, CAPILLARY
GLUCOSE-CAPILLARY: 123 mg/dL — AB (ref 70–99)
GLUCOSE-CAPILLARY: 136 mg/dL — AB (ref 70–99)
GLUCOSE-CAPILLARY: 141 mg/dL — AB (ref 70–99)
Glucose-Capillary: 90 mg/dL (ref 70–99)
Glucose-Capillary: 108 mg/dL — ABNORMAL HIGH (ref 70–99)
Glucose-Capillary: 81 mg/dL (ref 70–99)

## 2014-01-21 LAB — BASIC METABOLIC PANEL
ANION GAP: 10 (ref 5–15)
BUN: 13 mg/dL (ref 6–23)
CALCIUM: 7.5 mg/dL — AB (ref 8.4–10.5)
CO2: 20 mmol/L (ref 19–32)
CREATININE: 0.74 mg/dL (ref 0.50–1.10)
Chloride: 111 mEq/L (ref 96–112)
GFR calc non Af Amer: 75 mL/min — ABNORMAL LOW (ref >90)
GFR, EST AFRICAN AMERICAN: 87 mL/min — AB (ref >90)
Glucose, Bld: 125 mg/dL — ABNORMAL HIGH (ref 70–99)
POTASSIUM: 3.6 mmol/L (ref 3.5–5.1)
SODIUM: 141 mmol/L (ref 135–145)

## 2014-01-21 LAB — PROCALCITONIN: Procalcitonin: 0.3 ng/mL

## 2014-01-21 LAB — CBC WITH DIFFERENTIAL/PLATELET
BASOS ABS: 0 10*3/uL (ref 0.0–0.1)
Basophils Relative: 0 % (ref 0–1)
EOS ABS: 0 10*3/uL (ref 0.0–0.7)
EOS PCT: 0 % (ref 0–5)
HCT: 34.9 % — ABNORMAL LOW (ref 36.0–46.0)
HEMOGLOBIN: 11.7 g/dL — AB (ref 12.0–15.0)
Lymphocytes Relative: 8 % — ABNORMAL LOW (ref 12–46)
Lymphs Abs: 1.1 10*3/uL (ref 0.7–4.0)
MCH: 31.5 pg (ref 26.0–34.0)
MCHC: 33.5 g/dL (ref 30.0–36.0)
MCV: 94.1 fL (ref 78.0–100.0)
MONO ABS: 1.1 10*3/uL — AB (ref 0.1–1.0)
Monocytes Relative: 8 % (ref 3–12)
NEUTROS ABS: 11 10*3/uL — AB (ref 1.7–7.7)
Neutrophils Relative %: 84 % — ABNORMAL HIGH (ref 43–77)
PLATELETS: 194 10*3/uL (ref 150–400)
RBC: 3.71 MIL/uL — ABNORMAL LOW (ref 3.87–5.11)
RDW: 15.4 % (ref 11.5–15.5)
WBC: 13.2 10*3/uL — AB (ref 4.0–10.5)

## 2014-01-21 LAB — PHOSPHORUS
PHOSPHORUS: 2.4 mg/dL (ref 2.3–4.6)
Phosphorus: 1.8 mg/dL — ABNORMAL LOW (ref 2.3–4.6)
Phosphorus: 2.3 mg/dL (ref 2.3–4.6)

## 2014-01-21 LAB — BRAIN NATRIURETIC PEPTIDE: B Natriuretic Peptide: 344.2 pg/mL — ABNORMAL HIGH (ref 0.0–100.0)

## 2014-01-21 LAB — LACTIC ACID, PLASMA: Lactic Acid, Venous: 1.3 mmol/L (ref 0.5–2.2)

## 2014-01-21 LAB — TROPONIN I: Troponin I: 0.03 ng/mL (ref <0.031)

## 2014-01-21 LAB — MAGNESIUM
MAGNESIUM: 2 mg/dL (ref 1.5–2.5)
Magnesium: 1.9 mg/dL (ref 1.5–2.5)
Magnesium: 1.9 mg/dL (ref 1.5–2.5)

## 2014-01-21 MED ORDER — POTASSIUM CHLORIDE 20 MEQ/15ML (10%) PO SOLN
20.0000 meq | ORAL | Status: AC
  Administered 2014-01-21 (×2): 20 meq
  Filled 2014-01-21 (×2): qty 15

## 2014-01-21 MED ORDER — SODIUM PHOSPHATE 3 MMOLE/ML IV SOLN
20.0000 mmol | Freq: Once | INTRAVENOUS | Status: AC
  Administered 2014-01-21: 20 mmol via INTRAVENOUS
  Filled 2014-01-21: qty 6.67

## 2014-01-21 NOTE — Progress Notes (Signed)
Echocardiogram 2D Echocardiogram has been performed.  Melva Faux 01/21/2014, 2:51 PM

## 2014-01-21 NOTE — Procedures (Signed)
Extubation Procedure Note  Patient Details:   Name: Tanya Butler DOB: Jun 19, 1926 MRN: [id number]   Airway Documentation:     Evaluation  O2 sats: stable throughout Complications: No apparent complications Patient did tolerate procedure well. Bilateral Breath Sounds: Clear Suctioning: Airway Yes   Patient extubated to 2L nasal cannula.  Positive cuff leak noted.  No evidence of stridor.  Sats currently 98%.  Patient able to speak post extubation.  Vitals are stable.  No apparent complications.  [name] 01/21/2014, 3:14 PM

## 2014-01-21 NOTE — Progress Notes (Signed)
PULMONARY / CRITICAL CARE MEDICINE   Name: Tanya Butler MRN: [id number] DOB: 14-Jan-1927    ADMISSION DATE:  01/19/2014 CONSULTATION DATE:  01/19/2014  REFERRING MD :  EDP  CHIEF COMPLAINT:  Respiratory distress  INITIAL PRESENTATION:  81 year old female with past medical history as outlined below, which includes COPD (RB patient), hypertension, skin cancer, and GERD. She presented to [facility] emergency department 01/19/2014 with respiratory distress. Earlier that day she was in her attic cleaning was found by her family 15 minutes later, she was having trouble breathing and her lips were noted to be blue. EMS was called and upon their arrival she was noted to have an upper airway stridor, and shortly thereafter became unresponsive. She was intubated in the field and was given 0.3 of epinephrine, Benadryl, and H2 blocker. Upon arrival to emergency department she was awake alert and following commands with a positive cuff leak. She was extubated, but shortly after developed stridor and became hypoxic requiring reintubation. PCCM as been asked to see for admission.   has a past medical history of HTN (hypertension); Hyperlipidemia; Restless leg syndrome; Osteoporosis; GERD (gastroesophageal reflux disease); COPD (chronic obstructive pulmonary disease); Hypertonicity of bladder; and Skin cancer.  has past surgical history that includes Skin cancer excision; Esophagogastroduodenoscopy (N/A, 07/15/2012); and Esophagogastroduodenoscopy (N/A, 07/22/2012).   has past surgical history that includes Skin cancer excision; Esophagogastroduodenoscopy (N/A, 07/15/2012); and Esophagogastroduodenoscopy (N/A, 07/22/2012).   reports that she quit smoking about 9 years ago. Her smoking use included Cigarettes. She has a 50 pack-year smoking history. She does not have any smokeless tobacco history on file.    SIGNIFICANT EVENTS: 01/19/2014 -admt  01/20/14 -  Patient  And family denied to RN that there was dust  exposure. Patient went upto attic and just got very dyspneic. Failed SBT earlier. CT - with pneumonia. No ILD   SUBJECTIVE/OVERNIGHT/INTERVAL HX 01/21/14: agitated overnight. Doing SBT today. Ssays she is ready for extubation; some tan secretions noted via ETT. CT shwoed pneumonia    VITAL SIGNS: Temp:  [97.6 F (36.4 C)-99.2 F (37.3 C)] 98 F (36.7 C) (01/01 1200) Pulse Rate:  [26-103] 69 (01/01 1400) Resp:  [20-29] 25 (01/01 1400) BP: (100-164)/(47-111) 113/54 mmHg (01/01 1400) SpO2:  [97 %-99 %] 97 % (01/01 1440) FiO2 (%):  [30 %-40 %] 30 % (01/01 1440) Weight:  [69.6 kg (153 lb 7 oz)] 69.6 kg (153 lb 7 oz) (01/01 0442) HEMODYNAMICS:   VENTILATOR SETTINGS: Vent Mode:  [-] PSV;CPAP FiO2 (%):  [30 %-40 %] 30 % Set Rate:  [20 bmp] 20 bmp Vt Set:  [460 mL] 460 mL PEEP:  [5 cmH20] 5 cmH20 Pressure Support:  [10 cmH20-12 cmH20] 12 cmH20 Plateau Pressure:  [17 cmH20-22 cmH20] 22 cmH20 INTAKE / OUTPUT:  Intake/Output Summary (Last 24 hours) at 01/21/14 1452 Last data filed at 01/21/14 1400  Gross per 24 hour  Intake 3321.87 ml  Output   3385 ml  Net -63.13 ml    PHYSICAL EXAMINATION: General:  Elderly female on vent Neuro:  On lprecedex  - > RASS 0 to -1 CAM-ICU negative for delirium. Moves all 4s HEENT:  [place]/AT, ETT in place, No JVD noted Cardiovascular:  RRR no MRG. Borderline brady Lungs:  Coarse bilateral breath sounds. No wheeze Abdomen:  Soft, non-tender, non-distended Musculoskeletal:  No acute deformity or edema.  Skin:  Grossly intact  LABS: PULMONARY  Recent Labs Lab 01/19/14 1706 01/19/14 2002 01/20/14 0312  PHART 7.205* 7.304* 7.413  PCO2ART 71.1* 53.0* 36.0  PO2ART 131.0* 104.0* 78.6*  HCO3 28.1* 25.5* 22.5  TCO2 30 27.1 23.6  O2SAT 98.0 97.1 95.9    CBC  Recent Labs Lab 01/19/14 1554 01/20/14 0237 01/21/14 0228  HGB 13.9 10.5* 11.7*  HCT 43.7 31.8* 34.9*  WBC 12.0* 8.7 13.2*  PLT 248 171 194    COAGULATION No results for input(s):  INR in the last 168 hours.  CARDIAC    Recent Labs Lab 01/20/14 1016 01/20/14 1810 01/21/14 0228  TROPONINI <0.03 <0.03 0.03   No results for input(s): PROBNP in the last 168 hours.   CHEMISTRY  Recent Labs Lab 01/19/14 1554 01/20/14 0237 01/20/14 1025 01/20/14 2337 01/21/14 0228 01/21/14 1143  NA 139 138  --   --  141  --   K 5.0 3.7  --   --  3.6  --   CL 106 110  --   --  111  --   CO2 26 24  --   --  20  --   GLUCOSE 173* 132*  --   --  125*  --   BUN 15 15  --   --  13  --   CREATININE 1.26* 0.89  --   --  0.74  --   CALCIUM 9.1 7.8*  --   --  7.5*  --   MG  --  1.9 1.8 1.9 2.0 1.9  PHOS  --  1.7* 3.8 2.3 1.8* 2.4   Estimated Creatinine Clearance: 47 mL/min (by C-G formula based on Cr of 0.74).   LIVER No results for input(s): AST, ALT, ALKPHOS, BILITOT, PROT, ALBUMIN, INR in the last 168 hours.   INFECTIOUS  Recent Labs Lab 01/19/14 1607 01/20/14 1025 01/21/14 0220 01/21/14 0228  LATICACIDVEN 2.38*  --  1.3  --   PROCALCITON  --  0.31  --  0.30     ENDOCRINE CBG (last 3)   Recent Labs  01/21/14 0359 01/21/14 0836 01/21/14 1158  GLUCAP 136* 90 108*         IMAGING x48h Ct Chest High Resolution  01/20/2014   CLINICAL DATA:  81 year old female with history of acute respiratory failure. Evaluate for interstitial lung disease.  EXAM: CT CHEST WITHOUT CONTRAST  TECHNIQUE: Multidetector CT imaging of the chest was performed following the standard protocol without intravenous contrast. High resolution imaging of the lungs, as well as inspiratory and expiratory imaging, was performed.  COMPARISON:  No priors.  FINDINGS: Mediastinum: Heart size is mildly enlarged. There is no significant pericardial fluid, thickening or pericardial calcification. There is atherosclerosis of the thoracic aorta, the great vessels of the mediastinum and the coronary arteries, including calcified atherosclerotic plaque in the left main, left anterior descending and  right coronary arteries. Calcifications of the aortic valve and mitral annulus. Multiple borderline enlarged mediastinal and hilar lymph nodes, presumably reactive. Esophagus is unremarkable in appearance. Patient is intubated, with the tip of the endotracheal tube less than 1 cm above the level of the carina. Nasogastric tube extending at least of the stomach (tube extends below the lower margin of the images).  Lungs/Pleura: High-resolution images are limited by patient motion. There does appear to be relatively diffuse ground-glass attenuation with some interlobular septal thickening, favored to reflect a background of mild interstitial pulmonary edema. Given the presence of presumed edema, accurate assessment for interstitial lung disease is limited. With these limitations in mind, no definite areas of subpleural reticulation, parenchymal banding, traction bronchiectasis or frank honeycombing are appreciated. Inspiratory and  expiratory imaging appears to demonstrates a mild air trapping, suggesting small airways disease. In addition, there is a focal area of airspace consolidation with some air bronchograms in the right lower lobe, presumably an acute pneumonia. Small bilateral pleural effusions layering dependently.  Upper Abdomen: Small calcifications in the liver, presumably calcified granulomas.  Musculoskeletal: Old compression fracture of T9 with approximately 70% loss of anterior vertebral body height and 25% loss of posterior vertebral body height, unchanged compared to prior chest x-ray 07/21/2012. There are no aggressive appearing lytic or blastic lesions noted in the visualized portions of the skeleton.  IMPRESSION: 1. Despite the mild limitations of today's examination, there is no definitive evidence to suggest interstitial lung disease at this time. 2. Right lower lobe airspace consolidation and air bronchograms concerning for right lower lobe pneumonia. 3. In addition, there is mild cardiomegaly  with small bilateral pleural effusions and a background of mild interstitial pulmonary edema; imaging findings suggestive of underlying congestive heart failure. 4. Support apparatus, as above. Endotracheal tube is in a low position less than 1 cm above the carina. Consider withdrawal 2-3 cm for more optimal placement. 5. Atherosclerosis, including left main and 2 vessel coronary artery disease. These results will be called to the ordering clinician or representative by the Radiologist Assistant, and communication documented in the PACS or zVision Dashboard.   Electronically Signed   By: Vinnie Langton M.D.   On: 01/20/2014 15:27   Dg Chest Port 1 View  01/21/2014   CLINICAL DATA:  Interstitial lung disease, acute respiratory failure. History of hypertension and COPD.  EXAM: PORTABLE CHEST - 1 VIEW  COMPARISON:  01/20/2014  FINDINGS: Support devices are in stable position. Mild cardiomegaly with vascular congestion. Further improvement in interstitial opacities, particularly in the right lung, likely improving edema. Mild patchy bilateral infrahilar opacities persist. No effusions.  IMPRESSION: Improving interstitial opacities with mild residual bilateral perihilar and infrahilar opacities.   Electronically Signed   By: Rolm Baptise M.D.   On: 01/21/2014 09:23   Dg Chest Port 1 View  01/20/2014   CLINICAL DATA:  Re-evaluate airspace disease, acute respiratory failure  EXAM: PORTABLE CHEST - 1 VIEW  COMPARISON:  Portable chest x-ray of January 19, 2014  FINDINGS: The lungs are hypoinflated. The endotracheal tube tip lies 1.8 cm above the crotch of the carina. The pulmonary interstitial markings are less conspicuous today but remain increased. The cardiopericardial silhouette is top-normal in size. The pulmonary vascularity is less engorged. The esophagogastric tube project below the inferior margin of the image.  IMPRESSION: There has been slight interval improvement in the appearance of the pulmonary  interstitium which may reflect some resolving interstitial edema. The endotracheal tube tip lies 1.8 cm above the crotch of the carina. Withdrawal by 1 2 cm is recommended to avoid accidental mainstem bronchus intubation.   Electronically Signed   By: David  Martinique   On: 01/20/2014 07:37   Dg Chest Portable 1 View  01/19/2014   CLINICAL DATA:  Shortness of breath. History of COPD and hypertension.  EXAM: PORTABLE CHEST - 1 VIEW  COMPARISON:  07/21/2012  FINDINGS: Endotracheal tube is in place. The tip is 2 cm above the carina. NG tube is in the stomach.  Heart is borderline in size. Patchy bilateral airspace disease, right greater than left. This could represent asymmetric edema or infection. No effusions. No acute bony abnormality.  IMPRESSION: Patchy bilateral airspace disease, right greater than left. This could represent asymmetric edema or infection.  Electronically Signed   By: Rolm Baptise M.D.   On: 01/19/2014 16:42       ASSESSMENT / PLAN:  PULMONARY OETT 12/30> A: Baseline Hx of COPD NOS - AFL on spiro - follows with [facility] ? Baseline CXR July 2015 with ?? ILD change but CT 01/20/14 showed onl;y PNA. No ILD  Current  - Acute hypoxemic respiratory failure 2nd. ? Etiology (inhalation injury vs angioedema vs VCD) with Stridor  After extubation in ER  - 01/20/14 - denies inhalation injury.  Failed SBT   - 01/21/2014 ":meets extubation criteiria  P:   Extubate to bipap Nebs High risk reintubation  CARDIOVASCULAR A:  Hypotension, likely medication related.  Elevated lactic acid BNP elevation - 06/2012 echo with LVEF    - normal bp/hr  P:  Tele Repeat lactic D/C verapamil Holding propranolol 0- shold not in setting of copd nops  RENAL A:   normal  P:   monitopr Bmet Monitor UOP  GASTROINTESTINAL A:   H/o GERD  P:   Stop TF SUP: IV pepcid  HEMATOLOGIC A:   No acute issues  P:  Follow CBC Heparin for VTE ppx  INFECTIOUS   A:   Leukocytosis CAP vs  aspiration   - admit trach aspirate with GPC. Still tan on 01/21/14./ PCT can be c/w localized infection - CT shows pneumonia  P:   Check PCT and if normal DC abx BCx2 12/30> UC 12/30 > Sputum 12/30 > Repeat trach aspirate 01/21/14 Abx: Ceftriaxone 12/30 >1231 Abx: Azithromycin 12/30 >12/312 IV levaquin 01/21/14 >>  ENDOCRINE A:   Hyperglycemia without history of DM    P:   CBG and SSI  NEUROLOGIC A:   Acute metabolic encephalopathy   - normal WUA on precedex  P:   RASS goal: -1 to 0 PAD 3 with precedex; wean slowly off Monitor   FAMILY  - Updates: Niece (HCPOA) and friend updated ad at admit 01/19/2014. Patient updated 01/21/2014. No family at bedside   - Inter-disciplinary family meet or Palliative Care meeting due by: 01/25/2014     The patient is critically ill with multiple organ systems failure and requires high complexity decision making for assessment and support, frequent evaluation and titration of therapies, application of advanced monitoring technologies and extensive interpretation of multiple databases.   Critical Care Time devoted to patient care services described in this note is  30  Minutes. This time reflects time of care of this signee Dr Brand Males. This critical care time does not reflect procedure time, or teaching time or supervisory time of PA/NP/Med student/Med Resident etc but could involve care discussion time    Dr. Brand Males, M.D., [facility].C.P Pulmonary and Critical Care Medicine Staff Physician [place] [facility] Pager: [phone], If no answer or between  15:00h - 7:00h: call [phone]  01/21/2014 2:52 PM

## 2014-01-21 NOTE — Progress Notes (Signed)
[facility] ADULT ICU REPLACEMENT PROTOCOL FOR AM LAB REPLACEMENT ONLY  The patient does apply for the [facility] Adult ICU Electrolyte Replacment Protocol based on the criteria listed below:   1. Is GFR >/= 40 ml/min? Yes.    Patient's GFR today is 75 2. Is urine output >/= 0.5 ml/kg/hr for the last 6 hours? Yes.   Patient's UOP is 1.9 ml/kg/hr 3. Is BUN < 60 mg/dL? Yes.    Patient's BUN today is 13 4. Abnormal electrolyte K 3.6, phos 1.8, Mg 2.0 5. Ordered repletion with: per protocol 6. If a panic level lab has been reported, has the CCM MD in charge been notified? Yes.  .   Physician:  Zenovia Jarred 01/21/2014 5:00 AM

## 2014-01-21 NOTE — Progress Notes (Signed)
Patient stated she felt like she was working hard to breath.  Was placed on Bipap.  Currently tolerating well.

## 2014-01-22 ENCOUNTER — Inpatient Hospital Stay (HOSPITAL_COMMUNITY): Payer: Medicare Other

## 2014-01-22 DIAGNOSIS — J9601 Acute respiratory failure with hypoxia: Secondary | ICD-10-CM

## 2014-01-22 LAB — POCT I-STAT 3, ART BLOOD GAS (G3+)
ACID-BASE DEFICIT: 3 mmol/L — AB (ref 0.0–2.0)
BICARBONATE: 22.3 meq/L (ref 20.0–24.0)
O2 SAT: 100 %
PO2 ART: 298 mmHg — AB (ref 80.0–100.0)
Patient temperature: 98
TCO2: 24 mmol/L (ref 0–100)
pCO2 arterial: 40.6 mmHg (ref 35.0–45.0)
pH, Arterial: 7.346 — ABNORMAL LOW (ref 7.350–7.450)

## 2014-01-22 LAB — BASIC METABOLIC PANEL
Anion gap: 5 (ref 5–15)
BUN: 14 mg/dL (ref 6–23)
CALCIUM: 7 mg/dL — AB (ref 8.4–10.5)
CO2: 22 mmol/L (ref 19–32)
Chloride: 110 mEq/L (ref 96–112)
Creatinine, Ser: 0.55 mg/dL (ref 0.50–1.10)
GFR, EST NON AFRICAN AMERICAN: 83 mL/min — AB (ref >90)
Glucose, Bld: 103 mg/dL — ABNORMAL HIGH (ref 70–99)
Potassium: 4.1 mmol/L (ref 3.5–5.1)
Sodium: 137 mmol/L (ref 135–145)

## 2014-01-22 LAB — PHOSPHORUS
Phosphorus: 2.3 mg/dL (ref 2.3–4.6)
Phosphorus: 1.6 mg/dL — ABNORMAL LOW (ref 2.3–4.6)

## 2014-01-22 LAB — CBC WITH DIFFERENTIAL/PLATELET
BASOS ABS: 0 10*3/uL (ref 0.0–0.1)
Basophils Relative: 0 % (ref 0–1)
EOS ABS: 0.1 10*3/uL (ref 0.0–0.7)
EOS PCT: 1 % (ref 0–5)
HCT: 34.8 % — ABNORMAL LOW (ref 36.0–46.0)
Hemoglobin: 11.3 g/dL — ABNORMAL LOW (ref 12.0–15.0)
LYMPHS PCT: 9 % — AB (ref 12–46)
Lymphs Abs: 1 10*3/uL (ref 0.7–4.0)
MCH: 31.3 pg (ref 26.0–34.0)
MCHC: 32.5 g/dL (ref 30.0–36.0)
MCV: 96.4 fL (ref 78.0–100.0)
MONOS PCT: 9 % (ref 3–12)
Monocytes Absolute: 1 10*3/uL (ref 0.1–1.0)
NEUTROS ABS: 9.1 10*3/uL — AB (ref 1.7–7.7)
NEUTROS PCT: 81 % — AB (ref 43–77)
PLATELETS: 192 10*3/uL (ref 150–400)
RBC: 3.61 MIL/uL — ABNORMAL LOW (ref 3.87–5.11)
RDW: 15.8 % — AB (ref 11.5–15.5)
WBC: 11.3 10*3/uL — ABNORMAL HIGH (ref 4.0–10.5)

## 2014-01-22 LAB — MAGNESIUM
Magnesium: 1.9 mg/dL (ref 1.5–2.5)
Magnesium: 2.4 mg/dL (ref 1.5–2.5)

## 2014-01-22 LAB — GLUCOSE, CAPILLARY
GLUCOSE-CAPILLARY: 123 mg/dL — AB (ref 70–99)
GLUCOSE-CAPILLARY: 104 mg/dL — AB (ref 70–99)
GLUCOSE-CAPILLARY: 112 mg/dL — AB (ref 70–99)
Glucose-Capillary: 126 mg/dL — ABNORMAL HIGH (ref 70–99)
Glucose-Capillary: 69 mg/dL — ABNORMAL LOW (ref 70–99)
Glucose-Capillary: 90 mg/dL (ref 70–99)
Glucose-Capillary: 98 mg/dL (ref 70–99)

## 2014-01-22 LAB — TRIGLYCERIDES: Triglycerides: 85 mg/dL (ref <150)

## 2014-01-22 LAB — PROCALCITONIN: Procalcitonin: 0.18 ng/mL

## 2014-01-22 MED ORDER — PROPOFOL 10 MG/ML IV BOLUS
INTRAVENOUS | Status: AC
  Filled 2014-01-22: qty 20

## 2014-01-22 MED ORDER — SODIUM CHLORIDE 0.9 % IV BOLUS (SEPSIS)
500.0000 mL | Freq: Once | INTRAVENOUS | Status: AC
  Administered 2014-01-22: 500 mL via INTRAVENOUS

## 2014-01-22 MED ORDER — SODIUM CHLORIDE 0.9 % IV SOLN
1.0000 g | Freq: Once | INTRAVENOUS | Status: AC
  Administered 2014-01-22: 1 g via INTRAVENOUS
  Filled 2014-01-22: qty 10

## 2014-01-22 MED ORDER — PROPOFOL 10 MG/ML IV EMUL
INTRAVENOUS | Status: AC
  Administered 2014-01-22: 1000 mg
  Filled 2014-01-22: qty 100

## 2014-01-22 MED ORDER — MIDAZOLAM HCL 2 MG/2ML IJ SOLN
4.0000 mg | Freq: Once | INTRAMUSCULAR | Status: AC
  Administered 2014-01-22: 4 mg via INTRAVENOUS

## 2014-01-22 MED ORDER — DEXTROSE 5 % IV SOLN
0.0000 ug/min | INTRAVENOUS | Status: DC
  Administered 2014-01-22: 30 ug/min via INTRAVENOUS
  Administered 2014-01-22: 20 ug/min via INTRAVENOUS
  Administered 2014-01-23: 15 ug/min via INTRAVENOUS
  Administered 2014-01-23: 17 ug/min via INTRAVENOUS
  Administered 2014-01-24: 18 ug/min via INTRAVENOUS
  Administered 2014-01-24: 16 ug/min via INTRAVENOUS
  Filled 2014-01-22 (×6): qty 1

## 2014-01-22 MED ORDER — DEXTROSE 50 % IV SOLN
INTRAVENOUS | Status: AC
  Administered 2014-01-22: 25 mL via INTRAVENOUS
  Filled 2014-01-22: qty 50

## 2014-01-22 MED ORDER — VITAL HIGH PROTEIN PO LIQD
1000.0000 mL | ORAL | Status: DC
  Administered 2014-01-22 – 2014-01-24 (×3): 1000 mL
  Filled 2014-01-22 (×9): qty 1000

## 2014-01-22 MED ORDER — SUCCINYLCHOLINE CHLORIDE 20 MG/ML IJ SOLN
70.0000 mg | Freq: Once | INTRAMUSCULAR | Status: AC
  Administered 2014-01-22: 70 mg via INTRAVENOUS
  Filled 2014-01-22: qty 3.5

## 2014-01-22 MED ORDER — NONFORMULARY OR COMPOUNDED ITEM
0.2000 mL | Freq: Once | Status: AC
  Administered 2014-01-22: 0.2 mL via INTRADERMAL
  Filled 2014-01-22: qty 1

## 2014-01-22 MED ORDER — PROPOFOL 10 MG/ML IV BOLUS
70.0000 mg | Freq: Once | INTRAVENOUS | Status: AC
  Administered 2014-01-22: 70 mg via INTRAVENOUS

## 2014-01-22 MED ORDER — MAGNESIUM SULFATE 2 GM/50ML IV SOLN
2.0000 g | Freq: Once | INTRAVENOUS | Status: AC
  Administered 2014-01-22: 2 g via INTRAVENOUS
  Filled 2014-01-22: qty 50

## 2014-01-22 MED ORDER — PROPOFOL 10 MG/ML IV EMUL: 5.0000 ug/kg/min | INTRAVENOUS | Status: DC

## 2014-01-22 MED ORDER — DEXTROSE 50 % IV SOLN
25.0000 mL | Freq: Once | INTRAVENOUS | Status: AC
  Administered 2014-01-22: 25 mL via INTRAVENOUS

## 2014-01-22 NOTE — Progress Notes (Signed)
At time I assumed patient care( 1600) both IV's in the right arm were infiltrated. Both IV's were D/C'd immediately and new peripheral IV's ( 22 g) were started in the left forearm  are with good blood return. Patient tolerated the procedure well. The right arm continues to weap from the sites d/c'd. The arm has brisk refill to the nailbeds, and a good radial  Pulse. The patient states that the arm does not hurt. Neo was one of the IVF infusing. Pharmacy was notified for treatment of the sites and will be done by Polly Cobia, RN

## 2014-01-22 NOTE — Progress Notes (Signed)
Pt began to become very anxious, which presented more like a panic attack at 550. Pt on Bi-PaP but O2 Sats decreasing in mid 70s on 40%.Respiratory Therapist Jamie increased FiO2 to 100%. Pt HR increased into the 120s-140s. The rhythm was sinus tach. MD Titus Mould notified about situation and ordered for rapid intubation. Pt was given 4 mg of versed IV, 70 mg of propafol IV, and 70 mg of succinylcholine IV ordered by MD Belanger. Pt was successfully intubated. MD Belanger ordered continuous propafol infusion. Pt is now presenting with some drowsiness but calm. Pt able to follow commands at 0750. Will continue to monitor and assess.

## 2014-01-22 NOTE — Procedures (Signed)
Intubation Procedure Note Tanya Butler [id number] Sep 09, 1926  Procedure: Intubation Indications: Respiratory insufficiency  Procedure Details Consent: Unable to obtain consent because of emergent medical necessity.   Time Out: Verified patient identification, verified procedure, site/side was marked, verified correct patient position, special equipment/implants available, medications/allergies/relevent history reviewed, required imaging and test results available.  Performed  Maximum sterile technique was used including gloves and mask.   Seizing on arrival. Induced with 70 mg propofol/paralyzed with 70 mg succinylcholine. Glidescope used, grade 1 view.  Difficult airway (very small mouth), 3 attempts required. 7.0 tube secured at 23 cm. Good color change and strong bilateral breath sounds.     Evaluation Hemodynamic Status: BP stable throughout; O2 sats: transiently fell during during procedure Patient's Current Condition: stable Complications: No apparent complications Patient did tolerate procedure well. Chest X-ray ordered to verify placement.  CXR: pending.   Tanya Butler R. 01/22/2014

## 2014-01-22 NOTE — Progress Notes (Signed)
Patient is hypotensive. Sedation decreased. MD notified.

## 2014-01-22 NOTE — Progress Notes (Signed)
Bp has been low after being reintubated. Sedation decreased to improve bp.

## 2014-01-22 NOTE — Progress Notes (Signed)
NUTRITION FOLLOW UP/CONSULT  Intervention:   Increase TF via OG tube with Vital High Protein goal rate of 55 ml/h (1320 ml per day) to provide 1320 kcals, 115 gm protein, 1108 ml free water daily.  No BM documented since admission- recommend Stool Softener  Nutrition Dx:   Inadequate oral intake related to acute respiratory failure as evidenced by NPO/Vent status; ongoing  Goal:   Pt to meet >/= 90% of their estimated nutrition needs; being met  Monitor:   TF initiation/tolerance, Vent status, weight trend, labs  Assessment:   81 year old female with past medical history of COPD, hypertension, skin cancer, and GERD who developed stridor and dyspnea after inhaling unknown substance in her attic 01/19/2014. She was intubated upon EMS arrival and subsequently extubated in the emergency department. However, she developed stridor and hypoxia and was once again intubated.  Pt was started on tube feedings 12/31. She was extubated 01/21/13 and re-intubated 01/22/13- RD re-consulted for TF management. Vital high protein infusing via OGT at 50 ml/hr at time of visit.   Patient is currently intubated on ventilator support MV: 11 L/min Temp (24hrs), Avg:97.8 F (36.6 C), Min:97 F (36.1 C), Max:98.4 F (36.9 C)  Propofol: none  Labs reviewed.   Height: Ht Readings from Last 1 Encounters:  01/19/14 _0  (1.575 m)    Weight Status:   Wt Readings from Last 1 Encounters:  01/22/14 154 lb 15.7 oz (70.3 kg)  01/20/14 153 lb  Re-estimated needs:  Kcal: 1354 Protein: 100-110 grams Fluid: 2.2 L/day  Skin: intact  Diet Order: Diet NPO time specified   Intake/Output Summary (Last 24 hours) at 01/22/14 1746 Last data filed at 01/22/14 1700  Gross per 24 hour  Intake 2540.01 ml  Output   1445 ml  Net 1095.01 ml    Last BM: PTA  Labs:   Recent Labs Lab 01/20/14 0237  01/21/14 0228 01/21/14 1143 01/22/14 0215  NA 138  --  141  --  137  K 3.7  --  3.6  --  4.1  CL 110  --   111  --  110  CO2 24  --  20  --  22  BUN 15  --  13  --  14  CREATININE 0.89  --  0.74  --  0.55  CALCIUM 7.8*  --  7.5*  --  7.0*  MG 1.9  < > 2.0 1.9 1.9  PHOS 1.7*  < > 1.8* 2.4 2.3  GLUCOSE 132*  --  125*  --  103*  < > = values in this interval not displayed.  CBG (last 3)   Recent Labs  01/22/14 1147 01/22/14 1226 01/22/14 1519  GLUCAP 69* 104* 112*    Scheduled Meds: . antiseptic oral rinse  7 mL Mouth Rinse QID  . budesonide  0.25 mg Nebulization BID  . chlorhexidine  15 mL Mouth Rinse BID  . famotidine (PEPCID) IV  20 mg Intravenous Q24H  . heparin  5,000 Units Subcutaneous 3 times per day  . insulin aspart  2-6 Units Subcutaneous 6 times per day  . ipratropium-albuterol  3 mL Nebulization TID  . levofloxacin (LEVAQUIN) IV  750 mg Intravenous Q48H    Continuous Infusions: . sodium chloride    . sodium chloride 100 mL/hr at 01/22/14 0000  . dexmedetomidine 0.402 mcg/kg/hr (01/22/14 1535)  . phenylephrine (NEO-SYNEPHRINE) Adult infusion 30 mcg/min (01/22/14 1544)    Pryor Ochoa RD, LDN Inpatient Clinical Dietitian Pager: [phone] After Hours  Pager: [phone]  

## 2014-01-22 NOTE — Progress Notes (Signed)
Pt left upper arm swollen, pink and purple in color, brachial and radial pulses 2+, capillary refill less than 3 seconds. Pt had two peripheral IVs in left arm removed at 1700. The peripheral IV in [facility] had phenylephrine infusing through. Called pharmacy in regards to the phenylephrine infiltration. Pharmacist stated that Hyaluronidase would have to be given intradermally around the [facility] site. Notified Musician from [employer]. Gretchen obtained order from MD [place]. Hyaluronidase 15 units/ml was given (see MAR). Will continue to monitor and assess.

## 2014-01-22 NOTE — Progress Notes (Signed)
Patient had a hypoglycemic event with bs of 69. D50 given will repeat bs.

## 2014-01-22 NOTE — Procedures (Signed)
Intubation Procedure Note Tanya Butler [id number] [birth year]  Procedure: Intubation Indications: Airway protection and maintenance  Procedure Details Consent: Unable to obtain consent because of emergent medical necessity. Time Out: Verified patient identification, verified procedure, site/side was marked, verified correct patient position, special equipment/implants available, medications/allergies/relevent history reviewed, required imaging and test results available.  Performed  Maximum sterile technique was used including gloves and hand hygiene.  MAC and 3    Evaluation Hemodynamic Status: BP stable throughout; O2 sats: stable throughout Patient's Current Condition: stable Complications: No apparent complications Patient did tolerate procedure well. Chest X-ray ordered to verify placement.  CXR: pending.   Tanya Butler 01/22/2014

## 2014-01-22 NOTE — Progress Notes (Signed)
Repeat blood sugar is 104

## 2014-01-22 NOTE — Progress Notes (Signed)
RT pulled pt ETT back 4cm from 25 @lip  to 21 @lip  per MD order. No complications. Vital signs stable. Patient tolerated well. RN aware and called for stat chest xray. RT will continue to monitor.

## 2014-01-22 NOTE — Progress Notes (Signed)
PULMONARY / CRITICAL CARE MEDICINE   Name: Tanya Butler MRN: [id number] DOB: [birth year]    ADMISSION DATE:  01/19/2014 CONSULTATION DATE:  01/19/2014  REFERRING MD :  EDP  CHIEF COMPLAINT:  Respiratory distress  INITIAL PRESENTATION:  81 year old female with past medical history as outlined below, which includes COPD (RB patient), hypertension, skin cancer, and GERD. She presented to [facility] emergency department 01/19/2014 with respiratory distress. Earlier that day she was in her attic cleaning was found by her family 15 minutes later, she was having trouble breathing and her lips were noted to be blue. EMS was called and upon their arrival she was noted to have an upper airway stridor, and shortly thereafter became unresponsive. She was intubated in the field and was given 0.3 of epinephrine, Benadryl, and H2 blocker. Upon arrival to emergency department she was awake alert and following commands with a positive cuff leak. She was extubated, but shortly after developed stridor and became hypoxic requiring reintubation. PCCM as been asked to see for admission.   has a past medical history of HTN (hypertension); Hyperlipidemia; Restless leg syndrome; Osteoporosis; GERD (gastroesophageal reflux disease); COPD (chronic obstructive pulmonary disease); Hypertonicity of bladder; and Skin cancer.  has past surgical history that includes Skin cancer excision; Esophagogastroduodenoscopy (N/A, 07/15/2012); and Esophagogastroduodenoscopy (N/A, 07/22/2012).   has past surgical history that includes Skin cancer excision; Esophagogastroduodenoscopy (N/A, 07/15/2012); and Esophagogastroduodenoscopy (N/A, 07/22/2012).   reports that she quit smoking about 9 years ago. Her smoking use included Cigarettes. She has a 50 pack-year smoking history. She does not have any smokeless tobacco history on file.    SIGNIFICANT EVENTS: 01/19/2014 -admt  01/20/14 -  Patient  And family denied to RN that there was dust  exposure. Patient went upto attic and just got very dyspneic. Failed SBT earlier. CT - with pneumonia. No ILD. RF - negative  01/21/14: Extubated. Repeattracheal aspirate sent -    SUBJECTIVE/OVERNIGHT/INTERVAL HX 01/22/2014: reintubated earlier due to agitation - seems to have recurent sun downinng. This AM was hypotensive - Rx neo aftter failing fluid bolus. Autoimmune - still pending    VITAL SIGNS: Temp:  [97.6 F (36.4 C)-98.4 F (36.9 C)] 98 F (36.7 C) (01/02 0818) Pulse Rate:  [65-128] 87 (01/02 1015) Resp:  [13-32] 17 (01/02 1015) BP: (72-151)/(43-77) 118/57 mmHg (01/02 1015) SpO2:  [71 %-100 %] 100 % (01/02 1015) FiO2 (%):  [30 %-100 %] 100 % (01/02 0914) Weight:  [70.3 kg (154 lb 15.7 oz)] 70.3 kg (154 lb 15.7 oz) (01/02 0354) HEMODYNAMICS:   VENTILATOR SETTINGS: Vent Mode:  [-] PRVC FiO2 (%):  [30 %-100 %] 100 % Set Rate:  [8 bmp-20 bmp] 18 bmp Vt Set:  [460 mL] 460 mL PEEP:  [4 cmH20-5 cmH20] 5 cmH20 Pressure Support:  [5 cmH20-10 cmH20] 10 cmH20 Plateau Pressure:  [17 cmH20-21 cmH20] 21 cmH20 INTAKE / OUTPUT:  Intake/Output Summary (Last 24 hours) at 01/22/14 1132 Last data filed at 01/22/14 0800  Gross per 24 hour  Intake 2792.22 ml  Output   1270 ml  Net 1522.22 ml    PHYSICAL EXAMINATION: General:  Elderly female on vent Neuro:  On diprivan - > RASS 0 to -1 CAM-ICU negative for delirium. Moves all 4s HEENT:  [place]/AT, ETT in place, No JVD noted Cardiovascular:  RRR no MRG. Borderline brady Lungs:  Coarse bilateral breath sounds. No wheeze Abdomen:  Soft, non-tender, non-distended Musculoskeletal:  No acute deformity or edema.  Skin:  Grossly intact  LABS: PULMONARY  Recent Labs Lab 01/19/14 1706 01/19/14 2002 01/20/14 0312 01/22/14 0928  PHART 7.205* 7.304* 7.413 7.346*  PCO2ART 71.1* 53.0* 36.0 40.6  PO2ART 131.0* 104.0* 78.6* 298.0*  HCO3 28.1* 25.5* 22.5 22.3  TCO2 30 27.1 23.6 24  O2SAT 98.0 97.1 95.9 100.0    CBC  Recent Labs Lab  01/20/14 0237 01/21/14 0228 01/22/14 0215  HGB 10.5* 11.7* 11.3*  HCT 31.8* 34.9* 34.8*  WBC 8.7 13.2* 11.3*  PLT 171 194 192    COAGULATION No results for input(s): INR in the last 168 hours.  CARDIAC    Recent Labs Lab 01/20/14 1016 01/20/14 1810 01/21/14 0228  TROPONINI <0.03 <0.03 0.03   No results for input(s): PROBNP in the last 168 hours.   CHEMISTRY  Recent Labs Lab 01/19/14 1554  01/20/14 0237 01/20/14 1025 01/20/14 2337 01/21/14 0228 01/21/14 1143 01/22/14 0215  NA 139  --  138  --   --  141  --  137  K 5.0  --  3.7  --   --  3.6  --  4.1  CL 106  --  110  --   --  111  --  110  CO2 26  --  24  --   --  20  --  22  GLUCOSE 173*  --  132*  --   --  125*  --  103*  BUN 15  --  15  --   --  13  --  14  CREATININE 1.26*  --  0.89  --   --  0.74  --  0.55  CALCIUM 9.1  --  7.8*  --   --  7.5*  --  7.0*  MG  --   < > 1.9 1.8 1.9 2.0 1.9 1.9  PHOS  --   < > 1.7* 3.8 2.3 1.8* 2.4 2.3  < > = values in this interval not displayed. Estimated Creatinine Clearance: 47.2 mL/min (by C-G formula based on Cr of 0.55).   LIVER No results for input(s): AST, ALT, ALKPHOS, BILITOT, PROT, ALBUMIN, INR in the last 168 hours.   INFECTIOUS  Recent Labs Lab 01/19/14 1607 01/20/14 1025 01/21/14 0220 01/21/14 0228 01/22/14 0215  LATICACIDVEN 2.38*  --  1.3  --   --   PROCALCITON  --  0.31  --  0.30 0.18     ENDOCRINE CBG (last 3)   Recent Labs  01/22/14 0044 01/22/14 0352 01/22/14 0729  GLUCAP 98 90 123*         IMAGING x48h Ct Chest High Resolution  01/20/2014   CLINICAL DATA:  81 year old female with history of acute respiratory failure. Evaluate for interstitial lung disease.  EXAM: CT CHEST WITHOUT CONTRAST  TECHNIQUE: Multidetector CT imaging of the chest was performed following the standard protocol without intravenous contrast. High resolution imaging of the lungs, as well as inspiratory and expiratory imaging, was performed.  COMPARISON:   No priors.  FINDINGS: Mediastinum: Heart size is mildly enlarged. There is no significant pericardial fluid, thickening or pericardial calcification. There is atherosclerosis of the thoracic aorta, the great vessels of the mediastinum and the coronary arteries, including calcified atherosclerotic plaque in the left main, left anterior descending and right coronary arteries. Calcifications of the aortic valve and mitral annulus. Multiple borderline enlarged mediastinal and hilar lymph nodes, presumably reactive. Esophagus is unremarkable in appearance. Patient is intubated, with the tip of the endotracheal tube less than 1 cm above the level of the carina. Nasogastric  tube extending at least of the stomach (tube extends below the lower margin of the images).  Lungs/Pleura: High-resolution images are limited by patient motion. There does appear to be relatively diffuse ground-glass attenuation with some interlobular septal thickening, favored to reflect a background of mild interstitial pulmonary edema. Given the presence of presumed edema, accurate assessment for interstitial lung disease is limited. With these limitations in mind, no definite areas of subpleural reticulation, parenchymal banding, traction bronchiectasis or frank honeycombing are appreciated. Inspiratory and expiratory imaging appears to demonstrates a mild air trapping, suggesting small airways disease. In addition, there is a focal area of airspace consolidation with some air bronchograms in the right lower lobe, presumably an acute pneumonia. Small bilateral pleural effusions layering dependently.  Upper Abdomen: Small calcifications in the liver, presumably calcified granulomas.  Musculoskeletal: Old compression fracture of T9 with approximately 70% loss of anterior vertebral body height and 25% loss of posterior vertebral body height, unchanged compared to prior chest x-ray 07/21/2012. There are no aggressive appearing lytic or blastic lesions  noted in the visualized portions of the skeleton.  IMPRESSION: 1. Despite the mild limitations of today's examination, there is no definitive evidence to suggest interstitial lung disease at this time. 2. Right lower lobe airspace consolidation and air bronchograms concerning for right lower lobe pneumonia. 3. In addition, there is mild cardiomegaly with small bilateral pleural effusions and a background of mild interstitial pulmonary edema; imaging findings suggestive of underlying congestive heart failure. 4. Support apparatus, as above. Endotracheal tube is in a low position less than 1 cm above the carina. Consider withdrawal 2-3 cm for more optimal placement. 5. Atherosclerosis, including left main and 2 vessel coronary artery disease. These results will be called to the ordering clinician or representative by the Radiologist Assistant, and communication documented in the PACS or zVision Dashboard.   Electronically Signed   By: Vinnie Langton M.D.   On: 01/20/2014 15:27   Dg Chest Port 1 View  01/22/2014   CLINICAL DATA:  Re-evaluation of endotracheal tube positioning after retracting endotracheal tube  EXAM: PORTABLE CHEST - 1 VIEW  COMPARISON:  January 22, 2014 at 6:43 a.m.  FINDINGS: Endotracheal tube now identified with tip 19 mm above the carina. No change in NG tube. Stable mild cardiac enlargement and pulmonary vascular congestion.  IMPRESSION: Endotracheal tube tip 19 mm above the carina.   Electronically Signed   By: Skipper Cliche M.D.   On: 01/22/2014 10:41   Dg Chest Port 1 View  01/22/2014   CLINICAL DATA:  Respiratory failure.  EXAM: PORTABLE CHEST - 1 VIEW  COMPARISON:  01/21/2014  FINDINGS: Endotracheal tube tip is advanced to the origin of the right mainstem bronchus. The tip measures about 4 cm below the desired position above the carina. Enteric tube present and coiled in the left upper quadrant consistent with location in the stomach. Mild cardiac enlargement and pulmonary vascular  congestion. No definite edema or consolidation. No pneumothorax.  IMPRESSION: Endotracheal tube tip is advanced into the right mainstem bronchus, bowel 4 cm below the expected location above the carina.  These results were called by telephone at the time of interpretation on 01/22/2014 at 6:59 am to [facility], ICU nurse, who verbally acknowledged these results.   Electronically Signed   By: Lucienne Capers M.D.   On: 01/22/2014 07:01   Dg Chest Port 1 View  01/21/2014   CLINICAL DATA:  Interstitial lung disease, acute respiratory failure. History of hypertension and COPD.  EXAM: PORTABLE  CHEST - 1 VIEW  COMPARISON:  01/20/2014  FINDINGS: Support devices are in stable position. Mild cardiomegaly with vascular congestion. Further improvement in interstitial opacities, particularly in the right lung, likely improving edema. Mild patchy bilateral infrahilar opacities persist. No effusions.  IMPRESSION: Improving interstitial opacities with mild residual bilateral perihilar and infrahilar opacities.   Electronically Signed   By: Rolm Baptise M.D.   On: 01/21/2014 09:23   Dg Abd Portable 1v  01/22/2014   CLINICAL DATA:  Nasogastric tube placement at bedside.  EXAM: PORTABLE ABDOMEN - 1 VIEW  COMPARISON:  None.  FINDINGS: Nasogastric tube looped in the stomach with its tip in the mid to distal body. Bowel gas pattern unremarkable without evidence of obstruction or significant ileus. External cardiac pacing pads overlie the left upper abdomen.  IMPRESSION: 1. Nasogastric tube looped in the stomach with its tip in the mid to distal body of the stomach. 2. No acute abdominal abnormality.   Electronically Signed   By: Evangeline Dakin M.D.   On: 01/22/2014 07:58       ASSESSMENT / PLAN:  PULMONARY OETT 12/30>01/21/14, 01/22/13 >> A: Baseline Hx of COPD NOS - AFL on spiro - follows with [facility] ? Baseline CXR July 2015 with ?? ILD change but CT 01/20/14 showed onl;y PNA. No ILD  Current  - Acute hypoxemic  respiratory failure 2nd. ? Etiology (inhalation injury vs angioedema vs VCD) with Stridor  After extubation in ER. She and family on  01/20/14 - denied inhalation injury.    - 01/22/14 - reintubated followinganxiety  and desaturation.   P:   Full vent support Nebs  CARDIOVASCULAR A:  Hypotension, likely medication related.  Elevated lactic acid BNP elevation - 06/2012 echo with LVEF    - needing neo whileon diprivan. Has tolerated precedex in past without problems P:  Tele D/C verapamil NEo for MAP > 65 Change diprivan to precedex  RENAL A:   Mild low mag  P:   Replete mag Monitor UOP  GASTROINTESTINAL A:   H/o GERD  P:   Stop TF SUP: IV pepcid  HEMATOLOGIC A:   No acute issues  P:  Follow CBC Heparin for VTE ppx  INFECTIOUS  A:   Leukocytosis CAP vs aspiration - PCT profile c/w localized infection. CT shows pneumonia   - admit trach aspirate with GPC - repeat trach aspirate sent 01/21/14 due to tan color    - currently afebrile  P:   Repeat trach aspirate 01/21/14 Abx: Ceftriaxone 12/30 >1231 Abx: Azithromycin 12/30 >12/312 IV levaquin 01/21/14 >>  ENDOCRINE A:   Hyperglycemia without history of DM    P:   CBG and SSI  NEUROLOGIC A:   Acute metabolic encephalopathy   - normal WUA on diprivan  P:   RASS goal: -1 to 0 Change PAD 3 from diprivan to with precedex; wean slowly off Monitor   FAMILY  - Updates: Niece (HCPOA) and friend updated ad at admit 01/19/2014. Patient updated 01/22/2014. No family at bedside   - Inter-disciplinary family meet or Palliative Care meeting due by: 01/25/2014     The patient is critically ill with multiple organ systems failure and requires high complexity decision making for assessment and support, frequent evaluation and titration of therapies, application of advanced monitoring technologies and extensive interpretation of multiple databases.   Critical Care Time devoted to patient care services described  in this note is  30  Minutes. This time reflects time of care of this signee  Dr Brand Males. This critical care time does not reflect procedure time, or teaching time or supervisory time of PA/NP/Med student/Med Resident etc but could involve care discussion time    Dr. Brand Males, M.D., [facility].C.P Pulmonary and Critical Care Medicine Staff Physician [facility] Pager: [phone], If no answer or between  15:00h - 7:00h: call [phone]  01/22/2014 11:32 AM

## 2014-01-22 NOTE — Progress Notes (Signed)
130 mls of succinylcholine and 130 mls of diprivan was wasted. Witnessed by Barbaraann Barthel RN.

## 2014-01-22 NOTE — Progress Notes (Signed)
[place] for levaquin Indication: pneumonia  Allergies  Allergen Reactions  . Spiriva Handihaler [Tiotropium Bromide Monohydrate]     REACTION: pt stated made her breathing worse    Patient Measurements: Height: 5\' 2"  (157.5 cm) Weight: 154 lb 15.7 oz (70.3 kg) IBW/kg (Calculated) : 50.1  Vital Signs: Temp: 97 F (36.1 C) (01/02 1153) Temp Source: Oral (01/02 1153) BP: 120/53 mmHg (01/02 1300) Pulse Rate: 81 (01/02 1300) Intake/Output from previous day: 01/01 0701 - 01/02 0700 In: 3411.1 [I.V.:2701.1; NG/GT:410; IV Piggyback:300] Out: 1430 [Urine:1430] Intake/Output from this shift: Total I/O In: 222.7 [I.V.:112.7; IV Piggyback:110] Out: 150 [Urine:150]  Labs:  Recent Labs  01/20/14 0237 01/21/14 0228 01/22/14 0215  WBC 8.7 13.2* 11.3*  HGB 10.5* 11.7* 11.3*  PLT 171 194 192  CREATININE 0.89 0.74 0.55   Estimated Creatinine Clearance: 47.2 mL/min (by C-G formula based on Cr of 0.55). No results for input(s): VANCOTROUGH, VANCOPEAK, VANCORANDOM, GENTTROUGH, GENTPEAK, GENTRANDOM, TOBRATROUGH, TOBRAPEAK, TOBRARND, AMIKACINPEAK, AMIKACINTROU, AMIKACIN in the last 72 hours.   Microbiology: Recent Results (from the past 720 hour(s))  Culture, respiratory (NON-Expectorated)     Status: None (Preliminary result)   Collection Time: 01/19/14  5:04 PM  Result Value Ref Range Status   Specimen Description TRACHEAL ASPIRATE  Final   Special Requests NONE  Final   Gram Stain   Final    FEW WBC PRESENT,BOTH PMN AND MONONUCLEAR RARE SQUAMOUS EPITHELIAL CELLS PRESENT RARE GRAM POSITIVE COCCI IN PAIRS IN CLUSTERS Performed at [facility]    Culture PENDING  Incomplete   Report Status PENDING  Incomplete  MRSA PCR Screening     Status: None   Collection Time: 01/19/14  6:20 PM  Result Value Ref Range Status   MRSA by PCR NEGATIVE NEGATIVE Final    Comment:        The GeneXpert MRSA Assay (FDA approved for NASAL  specimens only), is one component of a comprehensive MRSA colonization surveillance program. It is not intended to diagnose MRSA infection nor to guide or monitor treatment for MRSA infections.     Assessment: 50 YOF with hx of COPD admitted with respiratory distress required intubation. Started on levofloxacin 12/30.  Currently afebrile, with WBC 11.3. Scr 0.55, est. crcl ~45-50 ml/min. Respiratory culture showing GPC-- a repeat was sent yesterday as sputum was tan.  Plan:  - Continue Levaquin 750mg  IV q48h - f/u cultures, renal function, LOT  Corliss Lamartina D. Dewon Mendizabal, PharmD, BCPS Clinical Pharmacist Pager: [phone] 01/22/2014 1:33 PM

## 2014-01-23 LAB — CBC WITH DIFFERENTIAL/PLATELET
Basophils Absolute: 0 10*3/uL (ref 0.0–0.1)
Basophils Relative: 0 % (ref 0–1)
EOS ABS: 0.1 10*3/uL (ref 0.0–0.7)
Eosinophils Relative: 1 % (ref 0–5)
HCT: 31.2 % — ABNORMAL LOW (ref 36.0–46.0)
HEMOGLOBIN: 10.3 g/dL — AB (ref 12.0–15.0)
LYMPHS ABS: 1.1 10*3/uL (ref 0.7–4.0)
LYMPHS PCT: 12 % (ref 12–46)
MCH: 31.6 pg (ref 26.0–34.0)
MCHC: 33 g/dL (ref 30.0–36.0)
MCV: 95.7 fL (ref 78.0–100.0)
Monocytes Absolute: 0.9 10*3/uL (ref 0.1–1.0)
Monocytes Relative: 9 % (ref 3–12)
NEUTROS PCT: 78 % — AB (ref 43–77)
Neutro Abs: 7 10*3/uL (ref 1.7–7.7)
PLATELETS: 190 10*3/uL (ref 150–400)
RBC: 3.26 MIL/uL — ABNORMAL LOW (ref 3.87–5.11)
RDW: 15.7 % — ABNORMAL HIGH (ref 11.5–15.5)
WBC: 9.1 10*3/uL (ref 4.0–10.5)

## 2014-01-23 LAB — BASIC METABOLIC PANEL
ANION GAP: 1 — AB (ref 5–15)
BUN: 15 mg/dL (ref 6–23)
CO2: 24 mmol/L (ref 19–32)
CREATININE: 0.64 mg/dL (ref 0.50–1.10)
Calcium: 7.2 mg/dL — ABNORMAL LOW (ref 8.4–10.5)
Chloride: 110 mEq/L (ref 96–112)
GFR calc non Af Amer: 79 mL/min — ABNORMAL LOW (ref >90)
GLUCOSE: 113 mg/dL — AB (ref 70–99)
Potassium: 3.7 mmol/L (ref 3.5–5.1)
SODIUM: 135 mmol/L (ref 135–145)

## 2014-01-23 LAB — HEPATIC FUNCTION PANEL
ALBUMIN: 2.1 g/dL — AB (ref 3.5–5.2)
ALK PHOS: 59 U/L (ref 39–117)
ALT: 18 U/L (ref 0–35)
AST: 20 U/L (ref 0–37)
BILIRUBIN DIRECT: 0.1 mg/dL (ref 0.0–0.3)
BILIRUBIN INDIRECT: 0.5 mg/dL (ref 0.3–0.9)
TOTAL PROTEIN: 4.6 g/dL — AB (ref 6.0–8.3)
Total Bilirubin: 0.6 mg/dL (ref 0.3–1.2)

## 2014-01-23 LAB — CULTURE, RESPIRATORY W GRAM STAIN

## 2014-01-23 LAB — GLUCOSE, CAPILLARY
GLUCOSE-CAPILLARY: 128 mg/dL — AB (ref 70–99)
GLUCOSE-CAPILLARY: 123 mg/dL — AB (ref 70–99)
GLUCOSE-CAPILLARY: 141 mg/dL — AB (ref 70–99)
Glucose-Capillary: 107 mg/dL — ABNORMAL HIGH (ref 70–99)
Glucose-Capillary: 121 mg/dL — ABNORMAL HIGH (ref 70–99)
Glucose-Capillary: 136 mg/dL — ABNORMAL HIGH (ref 70–99)

## 2014-01-23 LAB — ANTI-SCLERODERMA ANTIBODY: SCLERODERMA (SCL-70) (ENA) ANTIBODY, IGG: NEGATIVE

## 2014-01-23 LAB — CULTURE, RESPIRATORY

## 2014-01-23 LAB — URINE CULTURE: Colony Count: >=100000

## 2014-01-23 LAB — PHOSPHORUS
PHOSPHORUS: 2.6 mg/dL (ref 2.3–4.6)
Phosphorus: 1.5 mg/dL — ABNORMAL LOW (ref 2.3–4.6)

## 2014-01-23 LAB — MAGNESIUM
Magnesium: 2.2 mg/dL (ref 1.5–2.5)
Magnesium: 2.1 mg/dL (ref 1.5–2.5)

## 2014-01-23 LAB — MPO/PR-3 (ANCA) ANTIBODIES: Serine Protease 3: <1

## 2014-01-23 MED ORDER — METHYLPREDNISOLONE SODIUM SUCC 125 MG IJ SOLR
INTRAMUSCULAR | Status: AC
  Administered 2014-01-23: 60 mg via INTRAVENOUS
  Filled 2014-01-23: qty 2

## 2014-01-23 MED ORDER — RACEPINEPHRINE HCL 2.25 % IN NEBU
INHALATION_SOLUTION | RESPIRATORY_TRACT | Status: AC
  Administered 2014-01-23: 0.5 mL
  Filled 2014-01-23: qty 0.5

## 2014-01-23 MED ORDER — FENTANYL CITRATE 0.05 MG/ML IJ SOLN
INTRAMUSCULAR | Status: AC
  Administered 2014-01-23: 100 ug
  Filled 2014-01-23: qty 2

## 2014-01-23 MED ORDER — FUROSEMIDE 10 MG/ML IJ SOLN
20.0000 mg | Freq: Once | INTRAMUSCULAR | Status: AC
  Administered 2014-01-23: 20 mg via INTRAVENOUS
  Filled 2014-01-23: qty 2

## 2014-01-23 MED ORDER — DEXMEDETOMIDINE HCL IN NACL 200 MCG/50ML IV SOLN
0.4000 ug/kg/h | INTRAVENOUS | Status: DC
  Administered 2014-01-23: 0.6 ug/kg/h via INTRAVENOUS
  Administered 2014-01-24: 0.4 ug/kg/h via INTRAVENOUS
  Administered 2014-01-24: 0.3 ug/kg/h via INTRAVENOUS
  Administered 2014-01-24 – 2014-01-25 (×3): 0.4 ug/kg/h via INTRAVENOUS
  Filled 2014-01-23 (×8): qty 50

## 2014-01-23 MED ORDER — MIDAZOLAM HCL 2 MG/2ML IJ SOLN
INTRAMUSCULAR | Status: AC
  Administered 2014-01-23: 4 mg
  Filled 2014-01-23: qty 4

## 2014-01-23 MED ORDER — POTASSIUM PHOSPHATES 15 MMOLE/5ML IV SOLN
30.0000 mmol | Freq: Once | INTRAVENOUS | Status: AC
  Administered 2014-01-23: 30 mmol via INTRAVENOUS
  Filled 2014-01-23 (×2): qty 10

## 2014-01-23 MED ORDER — PROPOFOL 10 MG/ML IV EMUL
INTRAVENOUS | Status: AC
  Filled 2014-01-23: qty 100

## 2014-01-23 MED ORDER — METHYLPREDNISOLONE SODIUM SUCC 125 MG IJ SOLR
60.0000 mg | Freq: Four times a day (QID) | INTRAMUSCULAR | Status: DC
  Administered 2014-01-23 – 2014-01-25 (×7): 60 mg via INTRAVENOUS
  Filled 2014-01-23 (×11): qty 0.96

## 2014-01-23 MED ORDER — RACEPINEPHRINE HCL 2.25 % IN NEBU: 0.5000 mL | INHALATION_SOLUTION | Freq: Once | RESPIRATORY_TRACT | Status: AC

## 2014-01-23 NOTE — Progress Notes (Signed)
PULMONARY / CRITICAL CARE MEDICINE   Name: Tanya Butler MRN: [id number] DOB: 02/04/1926    ADMISSION DATE:  01/19/2014 CONSULTATION DATE:  01/19/2014  REFERRING MD :  EDP  CHIEF COMPLAINT:  Respiratory distress  INITIAL PRESENTATION:  81 year old female with past medical history as outlined below, which includes COPD (RB patient), hypertension, skin cancer, and GERD. She presented to [facility] emergency department 01/19/2014 with respiratory distress. Earlier that day she was in her attic cleaning was found by her family 15 minutes later, she was having trouble breathing and her lips were noted to be blue. EMS was called and upon their arrival she was noted to have an upper airway stridor, and shortly thereafter became unresponsive. She was intubated in the field and was given 0.3 of epinephrine, Benadryl, and H2 blocker. Upon arrival to emergency department she was awake alert and following commands with a positive cuff leak. She was extubated, but shortly after developed stridor and became hypoxic requiring reintubation. PCCM as been asked to see for admission.   has a past medical history of HTN (hypertension); Hyperlipidemia; Restless leg syndrome; Osteoporosis; GERD (gastroesophageal reflux disease); COPD (chronic obstructive pulmonary disease); Hypertonicity of bladder; and Skin cancer.  has past surgical history that includes Skin cancer excision; Esophagogastroduodenoscopy (N/A, 07/15/2012); and Esophagogastroduodenoscopy (N/A, 07/22/2012).   has past surgical history that includes Skin cancer excision; Esophagogastroduodenoscopy (N/A, 07/15/2012); and Esophagogastroduodenoscopy (N/A, 07/22/2012).   reports that she quit smoking about 9 years ago. Her smoking use included Cigarettes. She has a 50 pack-year smoking history. She does not have any smokeless tobacco history on file.    SIGNIFICANT EVENTS: 01/19/2014 -admt  01/20/14 -  Patient  And family denied to RN that there was dust  exposure. Patient went upto attic and just got very dyspneic. Failed SBT earlier. CT - with pneumonia. No ILD. RF  And all autoimmune- negative  01/21/14: Extubated. Repeattracheal aspirate sent -     01/22/2014: reintubated earlier due to agitation - seems to have recurent sun downinng. This AM was hypotensive - Rx neo aftter failing fluid bolus.    SUBJECTIVE/OVERNIGHT/INTERVAL HX 01/23/2014:did not come off ne0 after change of diprivan to  precedex. On PSV. . . No sundowning last night. Normal WUA. Phos very low     VITAL SIGNS: Temp:  [97.6 F (36.4 C)-100.3 F (37.9 C)] 99.3 F (37.4 C) (01/03 1159) Pulse Rate:  [71-101] 71 (01/03 1330) Resp:  [17-32] 23 (01/03 1330) BP: (86-153)/(40-97) 131/58 mmHg (01/03 1330) SpO2:  [99 %-100 %] 100 % (01/03 1330) FiO2 (%):  [40 %] 40 % (01/03 1105) Weight:  [72.6 kg (160 lb 0.9 oz)] 72.6 kg (160 lb 0.9 oz) (01/03 0500) HEMODYNAMICS:   VENTILATOR SETTINGS: Vent Mode:  [-] CPAP;PSV FiO2 (%):  [40 %] 40 % Set Rate:  [18 bmp] 18 bmp Vt Set:  [460 mL] 460 mL PEEP:  [5 cmH20] 5 cmH20 Pressure Support:  [14 cmH20] 14 cmH20 Plateau Pressure:  [21 cmH20-25 cmH20] 21 cmH20 INTAKE / OUTPUT:  Intake/Output Summary (Last 24 hours) at 01/23/14 1349 Last data filed at 01/23/14 1300  Gross per 24 hour  Intake 3952.19 ml  Output   1960 ml  Net 1992.19 ml    PHYSICAL EXAMINATION: General:  Elderly female on vent Neuro:  On neo - > RASS 0 to -1 CAM-ICU negative for delirium. Moves all 4s HEENT:  [place]/AT, ETT in place, No JVD noted Cardiovascular:  RRR no MRG. Borderline brady Lungs:  Coarse bilateral breath  sounds. No wheeze. Doin PSV Abdomen:  Soft, non-tender, non-distended Musculoskeletal:  No acute deformity or edema.  Skin:  Grossly intact  LABS: PULMONARY  Recent Labs Lab 01/19/14 1706 01/19/14 2002 01/20/14 0312 01/22/14 0928  PHART 7.205* 7.304* 7.413 7.346*  PCO2ART 71.1* 53.0* 36.0 40.6  PO2ART 131.0* 104.0* 78.6* 298.0*   HCO3 28.1* 25.5* 22.5 22.3  TCO2 30 27.1 23.6 24  O2SAT 98.0 97.1 95.9 100.0    CBC  Recent Labs Lab 01/21/14 0228 01/22/14 0215 01/23/14 0235  HGB 11.7* 11.3* 10.3*  HCT 34.9* 34.8* 31.2*  WBC 13.2* 11.3* 9.1  PLT 194 192 190    COAGULATION No results for input(s): INR in the last 168 hours.  CARDIAC    Recent Labs Lab 01/20/14 1016 01/20/14 1810 01/21/14 0228  TROPONINI <0.03 <0.03 0.03   No results for input(s): PROBNP in the last 168 hours.   CHEMISTRY  Recent Labs Lab 01/19/14 1554 01/20/14 0237  01/21/14 0228 01/21/14 1143 01/22/14 0215 01/22/14 1957 01/23/14 0600  NA 139 138  --  141  --  137  --  135  K 5.0 3.7  --  3.6  --  4.1  --  3.7  CL 106 110  --  111  --  110  --  110  CO2 26 24  --  20  --  22  --  24  GLUCOSE 173* 132*  --  125*  --  103*  --  113*  BUN 15 15  --  13  --  14  --  15  CREATININE 1.26* 0.89  --  0.74  --  0.55  --  0.64  CALCIUM 9.1 7.8*  --  7.5*  --  7.0*  --  7.2*  MG  --  1.9  < > 2.0 1.9 1.9 2.4 2.2  PHOS  --  1.7*  < > 1.8* 2.4 2.3 1.6* 1.5*  < > = values in this interval not displayed. Estimated Creatinine Clearance: 48 mL/min (by C-G formula based on Cr of 0.64).   LIVER  Recent Labs Lab 01/23/14 0235  AST 20  ALT 18  ALKPHOS 59  BILITOT 0.6  PROT 4.6*  ALBUMIN 2.1*     INFECTIOUS  Recent Labs Lab 01/19/14 1607 01/20/14 1025 01/21/14 0220 01/21/14 0228 01/22/14 0215  LATICACIDVEN 2.38*  --  1.3  --   --   PROCALCITON  --  0.31  --  0.30 0.18     ENDOCRINE CBG (last 3)   Recent Labs  01/23/14 0344 01/23/14 0732 01/23/14 1157  GLUCAP 128* 123* 107*         IMAGING x48h Dg Chest Port 1 View  01/22/2014   CLINICAL DATA:  Re-evaluation of endotracheal tube positioning after retracting endotracheal tube  EXAM: PORTABLE CHEST - 1 VIEW  COMPARISON:  January 22, 2014 at 6:43 a.m.  FINDINGS: Endotracheal tube now identified with tip 19 mm above the carina. No change in NG tube.  Stable mild cardiac enlargement and pulmonary vascular congestion.  IMPRESSION: Endotracheal tube tip 19 mm above the carina.   Electronically Signed   By: Skipper Cliche M.D.   On: 01/22/2014 10:41   Dg Chest Port 1 View  01/22/2014   CLINICAL DATA:  Respiratory failure.  EXAM: PORTABLE CHEST - 1 VIEW  COMPARISON:  01/21/2014  FINDINGS: Endotracheal tube tip is advanced to the origin of the right mainstem bronchus. The tip measures about 4 cm below the desired position  above the carina. Enteric tube present and coiled in the left upper quadrant consistent with location in the stomach. Mild cardiac enlargement and pulmonary vascular congestion. No definite edema or consolidation. No pneumothorax.  IMPRESSION: Endotracheal tube tip is advanced into the right mainstem bronchus, bowel 4 cm below the expected location above the carina.  These results were called by telephone at the time of interpretation on 01/22/2014 at 6:59 am to [facility], ICU nurse, who verbally acknowledged these results.   Electronically Signed   By: Lucienne Capers M.D.   On: 01/22/2014 07:01   Dg Abd Portable 1v  01/22/2014   CLINICAL DATA:  Nasogastric tube placement at bedside.  EXAM: PORTABLE ABDOMEN - 1 VIEW  COMPARISON:  None.  FINDINGS: Nasogastric tube looped in the stomach with its tip in the mid to distal body. Bowel gas pattern unremarkable without evidence of obstruction or significant ileus. External cardiac pacing pads overlie the left upper abdomen.  IMPRESSION: 1. Nasogastric tube looped in the stomach with its tip in the mid to distal body of the stomach. 2. No acute abdominal abnormality.   Electronically Signed   By: Evangeline Dakin M.D.   On: 01/22/2014 07:58       ASSESSMENT / PLAN:  PULMONARY OETT 12/30>01/21/14, 01/22/13 >> A: Baseline Hx of COPD NOS - AFL on spiro - follows with [facility] ? Baseline CXR July 2015 with ?? ILD change but CT 01/20/14 showed onl;y PNA. No ILD  Current  - Acute hypoxemic  respiratory failure 2nd. ? Etiology (inhalation injury vs angioedema vs VCD) with Stridor  After extubation in ER. She and family on  01/20/14 - denied inhalation injury.    - 01/22/14 - reintubated followinganxiety  and desaturation.  - 01/23/14  - doing well on PSV. Signaling desire for extubation but phos very low  P:   Extubate after starting K phos and one dose lasix Nebs  CARDIOVASCULAR A:  Hypotension, likely medication related.  Elevated lactic acid BNP elevation - 06/2012 echo with LVEF    - needing neo whileon diprivan and since 01/23/14 on percedec as well  P:  Tele D/C verapamil NEo for MAP > 65; due to anticpation of sundowning post extubation cntinue precedex   RENAL A:   Severe low phos  P:   Repletephos Monitor UOP  GASTROINTESTINAL A:   H/o GERD  P:   Stop TF SUP: IV pepcid  HEMATOLOGIC A:   No acute issues  P:  Follow CBC Heparin for VTE ppx  INFECTIOUS  A:   Leukocytosis CAP vs aspiration - PCT profile c/w localized infection. CT shows pneumonia   - admit trach aspirate with GPC - repeat trach aspirate sent 01/21/14 due to tan color - normal flora    - currently afebrile and on levaquin  QTC 0.34 per RN on 01/23/14  P:   Abx: Ceftriaxone 12/30 >1231 Abx: Azithromycin 12/30 >12/312 IV levaquin 01/21/14 >>  (plan stop 01/26/14))  ENDOCRINE A:   Hyperglycemia without history of DM    P:   CBG and SSI  NEUROLOGIC A:   BAseline  - 13/16 - niece gives hx of daily sundowning at home for few years  Current  - course complicated by Acute metabolic encephalopathy at night   On  13/1/16:  normal WUA on precedx P:   RASS goal: -1 to 0 Continue prcedex post extubation due to anticipated sun downing Monitor   FAMILY  - Updates: Niece Education officer, community) and friend updated ad at  admit 01/19/2014. Patient updated 01/22/2014.  NEice updated and patient 01/23/14   - Inter-disciplinary family meet or Palliative Care meeting due by:  01/25/2014  SUMMARY Extubate but keep precedex due to anticipatd sun downinng which runs risk for reintubation. Extubate post Phossupplment start     The patient is critically ill with multiple organ systems failure and requires high complexity decision making for assessment and support, frequent evaluation and titration of therapies, application of advanced monitoring technologies and extensive interpretation of multiple databases.   Critical Care Time devoted to patient care services described in this note is  35  Minutes. This time reflects time of care of this signee Dr Brand Males. This critical care time does not reflect procedure time, or teaching time or supervisory time of PA/NP/Med student/Med Resident etc but could involve care discussion time    Dr. Brand Males, M.D., [facility].C.P Pulmonary and Critical Care Medicine Staff Physician [place] [facility] Pager: [phone], If no answer or between  15:00h - 7:00h: call [phone]  01/23/2014 2:03 PM         The patient is critically ill with multiple organ systems failure and requires high complexity decision making for assessment and support, frequent evaluation and titration of therapies, application of advanced monitoring technologies and extensive interpretation of multiple databases.   Critical Care Time devoted to patient care services described in this note is  30  Minutes. This time reflects time of care of this signee Dr Brand Males. This critical care time does not reflect procedure time, or teaching time or supervisory time of PA/NP/Med student/Med Resident etc but could involve care discussion time    Dr. Brand Males, M.D., [facility].C.P Pulmonary and Critical Care Medicine Staff Physician [place] [facility] Pager: [phone], If no answer or between  15:00h - 7:00h: call [phone]  01/23/2014 1:49 PM

## 2014-01-23 NOTE — Procedures (Signed)
Intubation Procedure Note Tanya Butler [id number] 10/09/1926  Procedure: Intubation Indications: Respiratory insufficiency  Procedure Details Consent: Risks of procedure as well as the alternatives and risks of each were explained to the (patient/caregiver).  Consent for procedure obtained. Time Out: Verified patient identification, verified procedure, site/side was marked, verified correct patient position, special equipment/implants available, medications/allergies/relevent history reviewed, required imaging and test results available.  Performed  Maximum sterile technique was used including cap, gloves, gown, hand hygiene and mask.  MAC  NOTE: Patien was extubated severa hours ago and then resp distress with stridor. Improved some with reacemic epi and bipap but stil paradoxical. With glidescope intubaetd with fent, versed and etomidate wiithout paralytic. ARYTENOIDS SWOLLENT making entry of 7 size et tube into airway somewhat dificult =- still intubated first attemopt  Evaluation Hemodynamic Status: BP - persistent low bp needing pressors; O2 sats: stable throughout Patient's Current Condition: stable Complications: No apparent complications Patient did tolerate procedure well. Chest X-ray ordered to verify placement.  CXR: pending.   Tanya Butler 01/23/2014

## 2014-01-23 NOTE — Procedures (Signed)
Extubation Procedure Note  Patient Details:   Name: Tanya Butler DOB: 18-Jul-1926 MRN: [id number]   Airway Documentation:  Airway 7.5 mm (Active)  Secured at (cm) 21 cm 01/23/2014 11:05 AM  Measured From Lips 01/23/2014 11:05 AM  [place] 01/23/2014 11:05 AM  Secured By [employer] 01/23/2014 11:05 AM  Tube Holder Repositioned Yes 01/23/2014 11:05 AM  Cuff Pressure (cm H2O) 26 cm H2O 01/23/2014 11:05 AM  Site Condition Dry 01/23/2014 11:05 AM    Evaluation  O2 sats: stable throughout Complications: No apparent complications Patient did tolerate procedure well. Bilateral Breath Sounds: Diminished Suctioning: Airway No, terminal wean  Gonzella Lex 01/23/2014, 12:35 PM

## 2014-01-23 NOTE — Progress Notes (Signed)
Patient using accessory muscles and c/o difficulty taking deep breaths. Stridor heard upon auscultation. RT and MD notified. E-link notified.

## 2014-01-23 NOTE — Progress Notes (Signed)
Patient reintubated after developing stridor.

## 2014-01-23 NOTE — Procedures (Signed)
Extubation Procedure Note  Patient Details:   Name: Tanya Butler DOB: October 27, 1926 MRN: [id number]   Airway Documentation:  Airway 7.5 mm (Active)  Secured at (cm) 21 cm 01/23/2014 11:05 AM  Measured From Lips 01/23/2014 11:05 AM  [place] 01/23/2014 11:05 AM  Secured By [employer] 01/23/2014 11:05 AM  Tube Holder Repositioned Yes 01/23/2014 11:05 AM  Cuff Pressure (cm H2O) 26 cm H2O 01/23/2014 11:05 AM  Site Condition Dry 01/23/2014 11:05 AM    Evaluation  O2 sats: stable throughout Complications: No apparent complications Patient did tolerate procedure well. Bilateral Breath Sounds: Diminished Suctioning: Airway Yes, placed on 4L [place] pt tolerated well SPO2100%,  Tanya Butler 01/23/2014, 3:10 PM

## 2014-01-24 ENCOUNTER — Inpatient Hospital Stay (HOSPITAL_COMMUNITY): Payer: Medicare Other

## 2014-01-24 DIAGNOSIS — Z87898 Personal history of other specified conditions: Secondary | ICD-10-CM

## 2014-01-24 DIAGNOSIS — J80 Acute respiratory distress syndrome: Secondary | ICD-10-CM

## 2014-01-24 DIAGNOSIS — R0603 Acute respiratory distress: Secondary | ICD-10-CM | POA: Insufficient documentation

## 2014-01-24 DIAGNOSIS — Z4659 Encounter for fitting and adjustment of other gastrointestinal appliance and device: Secondary | ICD-10-CM

## 2014-01-24 DIAGNOSIS — J9600 Acute respiratory failure, unspecified whether with hypoxia or hypercapnia: Secondary | ICD-10-CM

## 2014-01-24 LAB — CBC WITH DIFFERENTIAL/PLATELET
BASOS ABS: 0 10*3/uL (ref 0.0–0.1)
Basophils Relative: 0 % (ref 0–1)
Eosinophils Absolute: 0 10*3/uL (ref 0.0–0.7)
Eosinophils Relative: 0 % (ref 0–5)
HCT: 33.7 % — ABNORMAL LOW (ref 36.0–46.0)
Hemoglobin: 11.6 g/dL — ABNORMAL LOW (ref 12.0–15.0)
Lymphocytes Relative: 5 % — ABNORMAL LOW (ref 12–46)
Lymphs Abs: 0.5 10*3/uL — ABNORMAL LOW (ref 0.7–4.0)
MCH: 31.4 pg (ref 26.0–34.0)
MCHC: 34.4 g/dL (ref 30.0–36.0)
MCV: 91.1 fL (ref 78.0–100.0)
Monocytes Absolute: 0.4 10*3/uL (ref 0.1–1.0)
Monocytes Relative: 4 % (ref 3–12)
Neutro Abs: 10.1 10*3/uL — ABNORMAL HIGH (ref 1.7–7.7)
Neutrophils Relative %: 91 % — ABNORMAL HIGH (ref 43–77)
PLATELETS: 220 10*3/uL (ref 150–400)
RBC: 3.7 MIL/uL — ABNORMAL LOW (ref 3.87–5.11)
RDW: 15.1 % (ref 11.5–15.5)
WBC: 11 10*3/uL — ABNORMAL HIGH (ref 4.0–10.5)

## 2014-01-24 LAB — CULTURE, RESPIRATORY W GRAM STAIN: Special Requests: NORMAL

## 2014-01-24 LAB — GLUCOSE, CAPILLARY
GLUCOSE-CAPILLARY: 133 mg/dL — AB (ref 70–99)
GLUCOSE-CAPILLARY: 166 mg/dL — AB (ref 70–99)
Glucose-Capillary: 140 mg/dL — ABNORMAL HIGH (ref 70–99)
Glucose-Capillary: 160 mg/dL — ABNORMAL HIGH (ref 70–99)
Glucose-Capillary: 152 mg/dL — ABNORMAL HIGH (ref 70–99)
Glucose-Capillary: 140 mg/dL — ABNORMAL HIGH (ref 70–99)

## 2014-01-24 LAB — BRAIN NATRIURETIC PEPTIDE: B NATRIURETIC PEPTIDE 5: 312 pg/mL — AB (ref 0.0–100.0)

## 2014-01-24 LAB — MAGNESIUM
Magnesium: 2.1 mg/dL (ref 1.5–2.5)
Magnesium: 2.2 mg/dL (ref 1.5–2.5)

## 2014-01-24 LAB — PHOSPHORUS
Phosphorus: 1.8 mg/dL — ABNORMAL LOW (ref 2.3–4.6)
Phosphorus: 2.2 mg/dL — ABNORMAL LOW (ref 2.3–4.6)

## 2014-01-24 LAB — ANA: ANA: NEGATIVE

## 2014-01-24 LAB — CYCLIC CITRUL PEPTIDE ANTIBODY, IGG: Cyclic Citrullin Peptide Ab: <2 U/mL (ref 0.0–5.0)

## 2014-01-24 LAB — CULTURE, RESPIRATORY

## 2014-01-24 MED ORDER — MIDAZOLAM HCL 2 MG/2ML IJ SOLN
4.0000 mg | Freq: Once | INTRAMUSCULAR | Status: AC
  Administered 2014-01-25: 3 mg via INTRAVENOUS
  Filled 2014-01-24: qty 4

## 2014-01-24 MED ORDER — DEXTROSE 5 % IV SOLN
15.0000 mmol | Freq: Once | INTRAVENOUS | Status: AC
  Administered 2014-01-24: 15 mmol via INTRAVENOUS
  Filled 2014-01-24: qty 5

## 2014-01-24 MED ORDER — VECURONIUM BROMIDE 10 MG IV SOLR
10.0000 mg | Freq: Once | INTRAVENOUS | Status: AC
  Administered 2014-01-25: 6 mg via INTRAVENOUS
  Filled 2014-01-24: qty 10

## 2014-01-24 MED ORDER — ETOMIDATE 2 MG/ML IV SOLN
40.0000 mg | Freq: Once | INTRAVENOUS | Status: AC
  Administered 2014-01-25: 30 mg via INTRAVENOUS
  Filled 2014-01-24: qty 20

## 2014-01-24 MED ORDER — PROPOFOL 10 MG/ML IV EMUL: 5.0000 ug/kg/min | Freq: Once | INTRAVENOUS | Status: DC

## 2014-01-24 MED ORDER — FENTANYL CITRATE 0.05 MG/ML IJ SOLN
200.0000 ug | Freq: Once | INTRAMUSCULAR | Status: AC
  Administered 2014-01-25: 150 ug via INTRAVENOUS
  Filled 2014-01-24: qty 4

## 2014-01-24 NOTE — Progress Notes (Signed)
PULMONARY / CRITICAL CARE MEDICINE   Name: Tanya Butler MRN: [id number] DOB: 1926/06/29    ADMISSION DATE:  01/19/2014 CONSULTATION DATE:  01/19/2014  REFERRING MD :  EDP  CHIEF COMPLAINT:  Respiratory distress  INITIAL PRESENTATION:  81 year old female with past medical history as outlined below, which includes COPD (RB patient), hypertension, skin cancer, and GERD. She presented to [facility] emergency department 01/19/2014 with respiratory distress.  SIGNIFICANT EVENTS: 01/19/2014 -admt 01/20/14 -  Patient  And family denied to RN that there was dust exposure. Patient went upto attic and just got very dyspneic. Failed SBT earlier. CT - with pneumonia. No ILD. RF  And all autoimmune- negative 01/21/14: Extubated. Repeattracheal aspirate sent - 01/22/2014: reintubated earlier due to agitation - seems to have recurent sun downinng. This AM was hypotensive - Rx neo aftter failing fluid bolus.  01/23/14: extubated but re intubated in the afternoon due to stridor  01/23/14 reintubated after developing stridor. Had swollen arytenoid.  SUBJECTIVE/OVERNIGHT/INTERVAL HX 01/24/2014: reintubated yesterday for stridor. NG tube placed.   VITAL SIGNS: Temp:  [98.6 F (37 C)-99.7 F (37.6 C)] 98.6 F (37 C) (01/04 0427) Pulse Rate:  [63-98] 68 (01/04 0700) Resp:  [18-33] 18 (01/04 0700) BP: (77-154)/(44-72) 121/52 mmHg (01/04 0700) SpO2:  [89 %-100 %] 99 % (01/04 0711) FiO2 (%):  [40 %] 40 % (01/04 0711) Weight:  [71.9 kg (158 lb 8.2 oz)] 71.9 kg (158 lb 8.2 oz) (01/04 0412) HEMODYNAMICS:   VENTILATOR SETTINGS: Vent Mode:  [-] PRVC FiO2 (%):  [40 %] 40 % Set Rate:  [18 bmp] 18 bmp Vt Set:  [460 mL] 460 mL PEEP:  [5 cmH20] 5 cmH20 Pressure Support:  [14 cmH20] 14 cmH20 Plateau Pressure:  [19 cmH20-22 cmH20] 21 cmH20 INTAKE / OUTPUT:  Intake/Output Summary (Last 24 hours) at 01/24/14 0757 Last data filed at 01/24/14 0700  Gross per 24 hour  Intake 3988.96 ml  Output   3445 ml  Net  543.96 ml    PHYSICAL EXAMINATION: General:  Elderly female on vent, calm, cooperative Neuro:  On neo - > RASS 0 to -1 CAM-ICU negative for delirium. Moves all 4s HEENT:  [place]/AT, ETT in place, No JVD noted Cardiovascular:  RRR, systolic loud murmur. Lungs:  Coarse bilateral breath sounds. No wheeze. Doin PSV Abdomen:  Soft, non-tender, non-distended Musculoskeletal:  No acute deformity or edema.  Skin:  Grossly intact  LABS: PULMONARY  Recent Labs Lab 01/19/14 1706 01/19/14 2002 01/20/14 0312 01/22/14 0928  PHART 7.205* 7.304* 7.413 7.346*  PCO2ART 71.1* 53.0* 36.0 40.6  PO2ART 131.0* 104.0* 78.6* 298.0*  HCO3 28.1* 25.5* 22.5 22.3  TCO2 30 27.1 23.6 24  O2SAT 98.0 97.1 95.9 100.0    CBC  Recent Labs Lab 01/21/14 0228 01/22/14 0215 01/23/14 0235  HGB 11.7* 11.3* 10.3*  HCT 34.9* 34.8* 31.2*  WBC 13.2* 11.3* 9.1  PLT 194 192 190    COAGULATION No results for input(s): INR in the last 168 hours.  CARDIAC    Recent Labs Lab 01/20/14 1016 01/20/14 1810 01/21/14 0228  TROPONINI <0.03 <0.03 0.03   No results for input(s): PROBNP in the last 168 hours.   CHEMISTRY  Recent Labs Lab 01/19/14 1554 01/20/14 0237  01/21/14 0228 01/21/14 1143 01/22/14 0215 01/22/14 1957 01/23/14 0600 01/23/14 1845  NA 139 138  --  141  --  137  --  135  --   K 5.0 3.7  --  3.6  --  4.1  --  3.7  --   CL 106 110  --  111  --  110  --  110  --   CO2 26 24  --  20  --  22  --  24  --   GLUCOSE 173* 132*  --  125*  --  103*  --  113*  --   BUN 15 15  --  13  --  14  --  15  --   CREATININE 1.26* 0.89  --  0.74  --  0.55  --  0.64  --   CALCIUM 9.1 7.8*  --  7.5*  --  7.0*  --  7.2*  --   MG  --  1.9  < > 2.0 1.9 1.9 2.4 2.2 2.1  PHOS  --  1.7*  < > 1.8* 2.4 2.3 1.6* 1.5* 2.6  < > = values in this interval not displayed. Estimated Creatinine Clearance: 47.7 mL/min (by C-G formula based on Cr of 0.64).   LIVER  Recent Labs Lab 01/23/14 0235  AST 20  ALT 18   ALKPHOS 59  BILITOT 0.6  PROT 4.6*  ALBUMIN 2.1*     INFECTIOUS  Recent Labs Lab 01/19/14 1607 01/20/14 1025 01/21/14 0220 01/21/14 0228 01/22/14 0215  LATICACIDVEN 2.38*  --  1.3  --   --   PROCALCITON  --  0.31  --  0.30 0.18     ENDOCRINE CBG (last 3)   Recent Labs  01/23/14 1911 01/23/14 2346 01/24/14 0426  GLUCAP 136* 166* 133*      IMAGING x48h Dg Chest Port 1 View  01/24/2014   CLINICAL DATA:  Check endotracheal tube, shortness of Breath  EXAM: PORTABLE CHEST - 1 VIEW  COMPARISON:  01/22/2014  FINDINGS: Cardiac shadow is stable. Endotracheal tube is seen 2 cm above the carinal. This is relatively stable in appearance from the prior exam. Nasogastric catheter is seen within the stomach. Increasing right-sided effusion is noted. Increasing left retrocardiac density is also seen.  IMPRESSION: Tubes and lines as described.  Increasing bibasilar changes with effusion on the right and left lower lobe atelectasis.   Electronically Signed   By: Inez Catalina M.D.   On: 01/24/2014 07:47   Dg Chest Port 1 View  01/22/2014   CLINICAL DATA:  Re-evaluation of endotracheal tube positioning after retracting endotracheal tube  EXAM: PORTABLE CHEST - 1 VIEW  COMPARISON:  January 22, 2014 at 6:43 a.m.  FINDINGS: Endotracheal tube now identified with tip 19 mm above the carina. No change in NG tube. Stable mild cardiac enlargement and pulmonary vascular congestion.  IMPRESSION: Endotracheal tube tip 19 mm above the carina.   Electronically Signed   By: Skipper Cliche M.D.   On: 01/22/2014 10:41   Dg Abd Portable 1v  01/24/2014   CLINICAL DATA:  81 year old female with enteric tube placement. Initial encounter. Current history of acute respiratory failure.  EXAM: PORTABLE ABDOMEN - 1 VIEW  COMPARISON:  01/22/2014 and earlier.  FINDINGS: Portable AP view at 0509 hrs. Enteric tube re - identified, and the tip has advanced just across midline projecting in the lower abdomen. The side hole  projects just to the left of the spine. Interval decreased bowel gas, non obstructed pattern. Mildly increased retrocardiac opacity. No definite pneumoperitoneum on this single image.  IMPRESSION: 1. Enteric tube tip has advanced just across midline in the lower abdomen compatible with antral placement within a ptotic stomach. Side hole at the level of the  distal gastric body. 2. Non obstructed bowel gas pattern. 3. Increased streaky left lung base opacity.   Electronically Signed   By: Lars Pinks M.D.   On: 01/24/2014 07:51    ASSESSMENT / PLAN:  PULMONARY OETT 12/30>01/21/14, 01/22/13 >> A: BaselineHx of COPD NOS - AFL on spiro - follows with [facility] ? Baseline CXR July 2015 with ?? ILD change but CT 01/20/14 showed onl;y PNA. No ILD Acute resp failure Upper airway trauma /edema   P:   Cont steroid, cont Nebs abg last reviewed Keep same MV on rest Wean cpap 5 ps 5-10, goal 2 hrs Plan trach given 4th ETT and stridor , edema airway  pcxr in am   CARDIOVASCULAR A:  Hypotension, likely medication related.  Elevated lactic acid BNP elevation - 06/2012 echo with LVEF 60-65% P:  Tele MAp goal 55 If unable to dc neo, then will have to dc precedex Tele If unable to dc neo = will place line  RENAL A:   Severe low phos improving with repletion  P:   Replete phos Await remainder bmet Monitor UOP May need cvp Reduce fluid rate   GASTROINTESTINAL A:   H/o GERD  P:   SUP: IV pepcid TF , dc in am   HEMATOLOGIC A:   No acute issues  P:  Follow CBC Heparin for VTE ppx Check coags as needs trach   INFECTIOUS  A:   Leukocytosis CAP vs aspiration - PCT profile c/w localized infection. CT shows pneumonia   - admit trach aspirate with GPC in pairs - repeat trach aspirate sent 01/21/14 due to tan color - normal flora   - currently afebrile and on levaquin  QTC 0.34 per RN on 01/23/14  P:   Abx: Ceftriaxone 12/30 >1231 Abx: Azithromycin 12/30 >12/312 IV levaquin 01/21/14 >>   (plan stop 01/26/14))   Continued plan stop date  ENDOCRINE A:   Hyperglycemia without history of DM    P:   CBG and SSI  NEUROLOGIC A:  metabolic encephalopathy at night   P:   RASS goal: -1 to 0 May need to dc precedex wua  FAMILY  - Updates: Niece (HCPOA) and friend updated ad at admit 01/19/2014. Patient updated 01/22/2014.  NEice updated and patient 01/23/14   - Inter-disciplinary family meet or Palliative Care meeting due by: 01/25/2014  SUMMARY Continue steroid, keep intubated. Check phos and replete. Cont abx.   Dellia Nims, M.D PGY-1 Internal Medicine Pager [phone]   01/24/2014 7:57 AM   STAFF NOTE: Linwood Dibbles, MD FACP have personally reviewed patient's available data, including medical history, events of note, physical examination and test results as part of my evaluation. I have discussed with resident/NP and other care providers such as pharmacist, RN and RRT. In addition, I personally evaluated patient and elicited key findings of: 4th ETT, stridor now, = trach, check coags, continued abx, slight coarse exam , in chair awake, await bmet, replace phose, reduce fluids The patient is critically ill with multiple organ systems failure and requires high complexity decision making for assessment and support, frequent evaluation and titration of therapies, application of advanced monitoring technologies and extensive interpretation of multiple databases.   Critical Care Time devoted to patient care services described in this note is30  Minutes. This time reflects time of care of this signee: Merrie Roof, MD FACP. This critical care time does not reflect procedure time, or teaching time or supervisory time of PA/NP/Med student/Med Resident etc but could involve  care discussion time. Rest per NP/medical resident whose note is outlined above and that I agree with   Lavon Paganini. Titus Mould, MD, [place] Pgr: [facility] 01/24/2014 10:01  AM

## 2014-01-24 NOTE — Evaluation (Signed)
Physical Therapy Evaluation Patient Details Name: Tanya Butler MRN: [id number] DOB: 09/21/1926 Today's Date: 01/24/2014   History of Present Illness  81 year old female with past medical history that includes COPD , hypertension, skin cancer, and GERD. She presented to [facility] emergency department 01/19/2014 with respiratory distress. Earlier that day she was in her attic cleaning was found by her family 15 minutes later, she was having trouble breathing and her lips were noted to be blue. ETT 12/30-1/1, 1/2-1/3 reintubated 1/3  Clinical Impression  Pt very pleasant and moving well despite ETT. Pt educated for plan and progression with encouragement to be OOB daily with nursing as well as continue bil LE movement for strengthening. Pt will benefit from acute therapy to maximize mobility, function and activity to decrease burden of care and return pt to independent function.     Follow Up Recommendations CIR;Supervision/Assistance - 24 hour    Equipment Recommendations  Other (comment) (TBD with progression)    Recommendations for Other Services       Precautions / Restrictions Precautions Precautions: Fall Precaution Comments: Vent, panda      Mobility  Bed Mobility Overal bed mobility: Needs Assistance Bed Mobility: Supine to Sit     Supine to sit: Min guard     General bed mobility comments: guarding for lines and vent with cues for sequence secondary to equipment  Transfers Overall transfer level: Needs assistance   Transfers: Sit to/from Stand;Stand Pivot Transfers Sit to Stand: Min assist Stand pivot transfers: Mod assist;+2 safety/equipment       General transfer comment: cues for sequence and hand placement with min assist to stand and mod to pivot with 2 person assist to manage equipment and complete pivot to chair. Pt able to scoot to EOB and back in chair with cues  Ambulation/Gait                Stairs            Wheelchair Mobility     Modified Rankin (Stroke Patients Only)       Balance Overall balance assessment: Needs assistance   Sitting balance-Leahy Scale: Good       Standing balance-Leahy Scale: Fair                               Pertinent Vitals/Pain Pain Assessment: No/denies pain  HR 85 ETT maintained at 23 cm throughout PRVC 40% with peep 5 99% BP supine 117/43 (61) Sitting 112/63 (75) In chair 115/49 (65)    Home Living Family/patient expects to be discharged to:: Private residence Living Arrangements: Alone Available Help at Discharge: Family Type of Home: House Home Access: Stairs to enter   Technical brewer of Steps: 4 Home Layout: One level Home Equipment: Environmental consultant - 2 wheels;Wheelchair - manual Additional Comments: pt does not use equipment at [employer]    Prior Function Level of Independence: Independent               Hand Dominance        Extremity/Trunk Assessment   Upper Extremity Assessment: Overall WFL for tasks assessed           Lower Extremity Assessment: Generalized weakness (bil hip flexion, knee flexion and extension all 4/5)      Cervical / Trunk Assessment: Normal  Communication   Communication: Other (comment) (ETT, pt mouthing around tube)  Cognition Arousal/Alertness: Awake/alert Behavior During Therapy: WFL for tasks assessed/performed Overall Cognitive  Status: Within Functional Limits for tasks assessed                      General Comments      Exercises        Assessment/Plan    PT Assessment Patient needs continued PT services  PT Diagnosis Difficulty walking   PT Problem List Decreased activity tolerance;Cardiopulmonary status limiting activity;Decreased mobility;Decreased balance  PT Treatment Interventions Gait training;Functional mobility training;Therapeutic activities;Therapeutic exercise;Balance training;Patient/family education   PT Goals (Current goals can be found in the Care Plan section)  Acute Rehab PT Goals Patient Stated Goal: return home PT Goal Formulation: With patient Time For Goal Achievement: 02/07/14 Potential to Achieve Goals: Good    Frequency Min 3X/week   Barriers to discharge Decreased caregiver support pt states she has family assist but not sure how much assist they can provide    Co-evaluation               End of Session   Activity Tolerance: Patient tolerated treatment well Patient left: in chair;with call bell/phone within reach;with restraints reapplied (bil mittens) Nurse Communication: Mobility status;Precautions         Time: 7628-3151 PT Time Calculation (min) (ACUTE ONLY): 16 min   Charges:   PT Evaluation $Initial PT Evaluation Tier I: 1 Procedure PT Treatments $Therapeutic Activity: 8-22 mins   PT G CodesMelford Aase 01/24/2014, 9:38 AM Elwyn Reach, [place]

## 2014-01-24 NOTE — Progress Notes (Signed)
SLP Cancellation Note  Patient Details Name: CIDNEY KIRKWOOD MRN: [id number] DOB: 05-May-1926   Cancelled treatment:       Reason Eval/Treat Not Completed: Medical issues which prohibited therapy (Intubated. Please re-consult when extubated.)  Gabriel Rainwater [place], CCC-SLP [phone]  Reniah Cottingham Meryl 01/24/2014, 9:48 AM

## 2014-01-24 NOTE — Progress Notes (Signed)
Received prescreen request for possible inpatient rehab consult and have reviewed pt's case. Pt has [facility] Medicare and it is not likely that they would give authorization for inpatient rehab based on her current diagnosis of respiratory failure/deconditioning. In light of this, we would recommend that skilled nursing, possibly LTAC (if pt does not wean from vent) or home health be pursued.  Thanks.  Nanetta Batty, PT Rehabilitation Admissions Coordinator [phone]

## 2014-01-24 NOTE — Progress Notes (Signed)
Spoke with Dr. Titus Mould for clarification on patients tube feed order. Patient off tube feeds for about 24 hrs due to extubation yesterday. Order says to increase tube feeds to new goal rate of 55. Tube feeds started at 25 for now as MD would like to defer to Nutrition for clarification. Paged [phone] with no answer. Tube feeds increased up to 55 at 6pm as patient has been tolerating them throughout the day.

## 2014-01-25 ENCOUNTER — Inpatient Hospital Stay (HOSPITAL_COMMUNITY): Payer: Medicare Other

## 2014-01-25 LAB — BASIC METABOLIC PANEL
ANION GAP: 4 — AB (ref 5–15)
BUN: 26 mg/dL — ABNORMAL HIGH (ref 6–23)
CALCIUM: 7.3 mg/dL — AB (ref 8.4–10.5)
CHLORIDE: 112 meq/L (ref 96–112)
CO2: 23 mmol/L (ref 19–32)
Creatinine, Ser: 0.6 mg/dL (ref 0.50–1.10)
GFR calc Af Amer: >90 mL/min (ref >90)
GFR calc non Af Amer: 81 mL/min — ABNORMAL LOW (ref >90)
GLUCOSE: 169 mg/dL — AB (ref 70–99)
POTASSIUM: 3.9 mmol/L (ref 3.5–5.1)
Sodium: 139 mmol/L (ref 135–145)

## 2014-01-25 LAB — GLUCOSE, CAPILLARY
GLUCOSE-CAPILLARY: 116 mg/dL — AB (ref 70–99)
GLUCOSE-CAPILLARY: 122 mg/dL — AB (ref 70–99)
GLUCOSE-CAPILLARY: 132 mg/dL — AB (ref 70–99)
GLUCOSE-CAPILLARY: 159 mg/dL — AB (ref 70–99)
Glucose-Capillary: 134 mg/dL — ABNORMAL HIGH (ref 70–99)
Glucose-Capillary: 134 mg/dL — ABNORMAL HIGH (ref 70–99)

## 2014-01-25 LAB — CBC WITH DIFFERENTIAL/PLATELET
BASOS ABS: 0 10*3/uL (ref 0.0–0.1)
Basophils Relative: 0 % (ref 0–1)
EOS PCT: 0 % (ref 0–5)
Eosinophils Absolute: 0 10*3/uL (ref 0.0–0.7)
HCT: 31.8 % — ABNORMAL LOW (ref 36.0–46.0)
Hemoglobin: 10.2 g/dL — ABNORMAL LOW (ref 12.0–15.0)
LYMPHS PCT: 4 % — AB (ref 12–46)
Lymphs Abs: 0.4 10*3/uL — ABNORMAL LOW (ref 0.7–4.0)
MCH: 30.1 pg (ref 26.0–34.0)
MCHC: 32.1 g/dL (ref 30.0–36.0)
MCV: 93.8 fL (ref 78.0–100.0)
Monocytes Absolute: 0.6 10*3/uL (ref 0.1–1.0)
Monocytes Relative: 6 % (ref 3–12)
NEUTROS ABS: 8.1 10*3/uL — AB (ref 1.7–7.7)
Neutrophils Relative %: 90 % — ABNORMAL HIGH (ref 43–77)
PLATELETS: 184 10*3/uL (ref 150–400)
RBC: 3.39 MIL/uL — ABNORMAL LOW (ref 3.87–5.11)
RDW: 15.5 % (ref 11.5–15.5)
WBC: 9.1 10*3/uL (ref 4.0–10.5)

## 2014-01-25 LAB — PROTIME-INR
INR: 1.12 (ref 0.00–1.49)
Prothrombin Time: 14.6 seconds (ref 11.6–15.2)

## 2014-01-25 LAB — PHOSPHORUS: Phosphorus: 1.8 mg/dL — ABNORMAL LOW (ref 2.3–4.6)

## 2014-01-25 LAB — MAGNESIUM: MAGNESIUM: 2.4 mg/dL (ref 1.5–2.5)

## 2014-01-25 MED ORDER — POTASSIUM PHOSPHATES 15 MMOLE/5ML IV SOLN
15.0000 mmol | Freq: Once | INTRAVENOUS | Status: AC
  Administered 2014-01-25: 15 mmol via INTRAVENOUS
  Filled 2014-01-25: qty 5

## 2014-01-25 MED ORDER — POTASSIUM PHOSPHATE MONOBASIC 500 MG PO TABS: 500.0000 mg | ORAL_TABLET | Freq: Three times a day (TID) | ORAL | Status: DC

## 2014-01-25 MED ORDER — METHYLPREDNISOLONE SODIUM SUCC 125 MG IJ SOLR
60.0000 mg | Freq: Two times a day (BID) | INTRAMUSCULAR | Status: DC
  Administered 2014-01-25 – 2014-01-26 (×2): 60 mg via INTRAVENOUS
  Filled 2014-01-25 (×4): qty 0.96

## 2014-01-25 NOTE — Progress Notes (Signed)
[place] for levaquin Indication: pneumonia  Allergies  Allergen Reactions  . Spiriva Handihaler [Tiotropium Bromide Monohydrate]     REACTION: pt stated made her breathing worse    Patient Measurements: Height: 5\' 2"  (157.5 cm) Weight: 162 lb 0.6 oz (73.5 kg) IBW/kg (Calculated) : 50.1  Vital Signs: Temp: 97.6 F (36.4 C) (01/05 1159) Temp Source: Oral (01/05 1159) BP: 104/49 mmHg (01/05 1500) Pulse Rate: 65 (01/05 1500) Intake/Output from previous day: 01/04 0701 - 01/05 0700 In: 2894.9 [I.V.:1699.9; NG/GT:1145; IV Piggyback:50] Out: 122 [Urine:875] Intake/Output from this shift: Total I/O In: 428 [I.V.:428] Out: 155 [Urine:155]  Labs:  Recent Labs  01/23/14 0235 01/23/14 0600 01/24/14 0838 01/25/14 0702  WBC 9.1  --  11.0* 9.1  HGB 10.3*  --  11.6* 10.2*  PLT 190  --  220 184  CREATININE  --  0.64  --  0.60   Estimated Creatinine Clearance: 48.3 mL/min (by C-G formula based on Cr of 0.6). No results for input(s): VANCOTROUGH, VANCOPEAK, VANCORANDOM, GENTTROUGH, GENTPEAK, GENTRANDOM, TOBRATROUGH, TOBRAPEAK, TOBRARND, AMIKACINPEAK, AMIKACINTROU, AMIKACIN in the last 72 hours.   Microbiology: Recent Results (from the past 720 hour(s))  Culture, Urine     Status: None   Collection Time: 01/19/14  4:28 PM  Result Value Ref Range Status   Specimen Description URINE, CATHETERIZED  Final   Special Requests ADDED [id number]  Final   Colony Count   Final    >=100,000 COLONIES/ML Performed at [facility]    Culture   Final    ESCHERICHIA COLI Performed at [facility]    Report Status 01/23/2014 FINAL  Final   Organism ID, Bacteria ESCHERICHIA COLI  Final      Susceptibility   Escherichia coli - MIC*    AMPICILLIN 4 SENSITIVE Sensitive     CEFAZOLIN <=4 SENSITIVE Sensitive     CEFTRIAXONE <=1 SENSITIVE Sensitive     CIPROFLOXACIN <=0.25 SENSITIVE Sensitive     GENTAMICIN <=1 SENSITIVE Sensitive      LEVOFLOXACIN <=0.12 SENSITIVE Sensitive     NITROFURANTOIN <=16 SENSITIVE Sensitive     TOBRAMYCIN <=1 SENSITIVE Sensitive     TRIMETH/SULFA <=20 SENSITIVE Sensitive     PIP/TAZO <=4 SENSITIVE Sensitive     * ESCHERICHIA COLI  Culture, respiratory (NON-Expectorated)     Status: None   Collection Time: 01/19/14  5:04 PM  Result Value Ref Range Status   Specimen Description TRACHEAL ASPIRATE  Final   Special Requests NONE  Final   Gram Stain   Final    FEW WBC PRESENT,BOTH PMN AND MONONUCLEAR RARE SQUAMOUS EPITHELIAL CELLS PRESENT RARE GRAM POSITIVE COCCI IN PAIRS IN CLUSTERS Performed at [facility]    Culture   Final    MODERATE MORAXELLA CATARRHALIS(BRANHAMELLA) Note: BETA LACTAMASE POSITIVE Performed at [facility]    Report Status 01/23/2014 FINAL  Final  Culture, blood (routine x 2)     Status: None (Preliminary result)   Collection Time: 01/19/14  5:43 PM  Result Value Ref Range Status   Specimen Description BLOOD ARM LEFT  Final   Special Requests BOTTLES DRAWN AEROBIC AND ANAEROBIC 5CC  Final   Culture   Final           BLOOD CULTURE RECEIVED NO GROWTH TO DATE CULTURE WILL BE HELD FOR 5 DAYS BEFORE ISSUING A FINAL NEGATIVE REPORT Performed at [facility]    Report Status PENDING  Incomplete  Culture, blood (routine  x 2)     Status: None (Preliminary result)   Collection Time: 01/19/14  5:52 PM  Result Value Ref Range Status   Specimen Description BLOOD FOREARM LEFT  Final   Special Requests BOTTLES DRAWN AEROBIC AND ANAEROBIC 5CC  Final   Culture   Final           BLOOD CULTURE RECEIVED NO GROWTH TO DATE CULTURE WILL BE HELD FOR 5 DAYS BEFORE ISSUING A FINAL NEGATIVE REPORT Performed at [facility]    Report Status PENDING  Incomplete  MRSA PCR Screening     Status: None   Collection Time: 01/19/14  6:20 PM  Result Value Ref Range Status   MRSA by PCR NEGATIVE NEGATIVE Final    Comment:        The GeneXpert MRSA Assay  (FDA approved for NASAL specimens only), is one component of a comprehensive MRSA colonization surveillance program. It is not intended to diagnose MRSA infection nor to guide or monitor treatment for MRSA infections.   Culture, respiratory (NON-Expectorated)     Status: None   Collection Time: 01/21/14  3:21 PM  Result Value Ref Range Status   Specimen Description TRACHEAL ASPIRATE  Final   Special Requests Normal  Final   Gram Stain   Final    FEW WBC PRESENT,BOTH PMN AND MONONUCLEAR RARE SQUAMOUS EPITHELIAL CELLS PRESENT RARE GRAM POSITIVE COCCI IN PAIRS IN CLUSTERS RARE GRAM NEGATIVE RODS Performed at [facility]    Culture   Final    Non-Pathogenic Oropharyngeal-type Flora Isolated. Performed at [facility]    Report Status 01/24/2014 FINAL  Final    Assessment: 7 YOF with hx of COPD admitted with respiratory distress required intubation. Pt on levaquin (Day #7) for moraxella PNA and Ecoli UTI. Plan to stop tomorrow.  Currently afebrile, with WBC 9.1. Scr 0.6, est. crcl ~45-50 ml/min. .  Plan:  - Continue Levaquin 750mg  IV q48h - f/u cultures, renal function, LOT  Gloriajean Dell, PharmD Candidate  I agree with above.  Sherlon Handing, PharmD, BCPS Clinical pharmacist, pager [phone] 01/25/2014  3:33 PM

## 2014-01-25 NOTE — Procedures (Signed)
Bronchoscopy  for Percutaneous  Tracheostomy  Name: ZURI LASCALA MRN: [id number] DOB: 1926-10-17 Procedure: Bronchoscopy for Percutaneous Tracheostomy Indications: Diagnostic evaluation of the airways In conjunction with: Dr. Titus Mould   Procedure Details Consent: Risks of procedure as well as the alternatives and risks of each were explained to the (patient/caregiver).  Consent for procedure obtained. Time Out: Verified patient identification, verified procedure, site/side was marked, verified correct patient position, special equipment/implants available, medications/allergies/relevent history reviewed, required imaging and test results available.  Performed  In preparation for procedure, patient was given 100% FiO2 and bronchoscope lubricated. Sedation: Benzodiazepines, Muscle relaxants and Etomidate  Airway entered and the following bronchi were examined: RML.   Procedures performed: Endotracheal Tube retracted in 2 cm increments. Cannulation of airway observed. Dilation observed. Placement of trachel tube  observed . No overt complications. Bronchoscope removed.    Evaluation Hemodynamic Status: BP stable throughout; O2 sats: stable throughout Patient's Current Condition: stable Specimens:  None Complications: No apparent complications Patient did tolerate procedure well.   Richardson Landry Minor ACNP Maryanna Shape PCCM Pager [phone] till 3 pm If no answer page [phone] 01/25/2014, 2:03 PM  I was present for and supervised the entire procedure  Merton Border, MD ; [facility] service Mobile [phone].  After 5:30 PM or weekends, call [phone]

## 2014-01-25 NOTE — Procedures (Signed)
Bedside Tracheostomy Insertion Procedure Note   Patient Details:   Name: Tanya Butler DOB: 15-Feb-1926 MRN: [id number]  Procedure: Tracheostomy  Pre Procedure Assessment: ET Tube Size:7.5 ET Tube secured at lip (cm):20 Bite block in place: No Breath Sounds: Clear  Post Procedure Assessment: BP 102/48 mmHg  Pulse 71  Temp(Src) 97.6 F (36.4 C) (Oral)  Resp 20  Ht 5\' 2"  (1.575 m)  Wt 162 lb 0.6 oz (73.5 kg)  BMI 29.63 kg/m2  SpO2 99% O2 sats: stable throughout Complications: No apparent complications Patient did tolerate procedure well Tracheostomy Brand:Shiley Tracheostomy Style:Cuffed Tracheostomy Size: 6.0 Tracheostomy Secured FYB:[id number], velcro Tracheostomy Placement Confirmation:Trach cuff visualized and in place, cxr taken for placement    Readling, Tammie Ann 01/25/2014, 2:52 PM

## 2014-01-25 NOTE — Progress Notes (Signed)
PT Cancellation Note  Patient Details Name: TAHLIYAH ANAGNOS MRN: [id number] DOB: Jun 11, 1926   Cancelled Treatment:    Reason Eval/Treat Not Completed: Medical issues which prohibited therapy Spoke with RN who reports pt has just had a trach placement and is still sedated. Will follow up as time allows. Likely tomorrow (01/26/13).  [place], [place] Ellouise Newer 01/25/2014, 3:34 PM

## 2014-01-25 NOTE — Progress Notes (Signed)
PULMONARY / CRITICAL CARE MEDICINE   Name: Tanya Butler MRN: [id number] DOB: 1927/01/18    ADMISSION DATE:  01/19/2014 CONSULTATION DATE:  01/19/2014  REFERRING MD :  EDP  CHIEF COMPLAINT:  Respiratory distress  INITIAL PRESENTATION:  81 year old female with past medical history as outlined below, which includes COPD (RB patient), hypertension, skin cancer, and GERD. She presented to [facility] emergency department 01/19/2014 with respiratory distress.  SIGNIFICANT EVENTS: 01/19/2014 -admt 01/20/14 -  Patient  And family denied to RN that there was dust exposure. Patient went upto attic and just got very dyspneic. Failed SBT earlier. CT - with pneumonia. No ILD. RF  And all autoimmune- negative 01/21/14: Extubated. Repeattracheal aspirate sent - 01/22/2014: reintubated earlier due to agitation - seems to have recurent sun downinng. This AM was hypotensive - Rx neo aftter failing fluid bolus.  01/23/14: extubated but re intubated in the afternoon due to stridor  01/23/14 reintubated after developing stridor. Had swollen arytenoid. 01/24/14: No events.   SUBJECTIVE/OVERNIGHT/INTERVAL HX 01/25/2014: No events overnight. Stopped Neo.   VITAL SIGNS: Temp:  [98 F (36.7 C)-98.8 F (37.1 C)] 98 F (36.7 C) (01/05 0354) Pulse Rate:  [64-111] 75 (01/05 0600) Resp:  [0-31] 18 (01/05 0600) BP: (81-133)/(40-93) 97/50 mmHg (01/05 0600) SpO2:  [97 %-100 %] 98 % (01/05 0600) FiO2 (%):  [40 %] 40 % (01/05 0416) Weight:  [73.5 kg (162 lb 0.6 oz)] 73.5 kg (162 lb 0.6 oz) (01/05 0439) HEMODYNAMICS:   VENTILATOR SETTINGS: Vent Mode:  [-] PRVC FiO2 (%):  [40 %] 40 % Set Rate:  [18 bmp] 18 bmp Vt Set:  [460 mL] 460 mL PEEP:  [5 cmH20] 5 cmH20 Pressure Support:  [10 cmH20] 10 cmH20 Plateau Pressure:  [20 KGM01-02 cmH20] 20 cmH20 INTAKE / OUTPUT:  Intake/Output Summary (Last 24 hours) at 01/25/14 0700 Last data filed at 01/25/14 0600  Gross per 24 hour  Intake 2782.92 ml  Output    875 ml   Net 1907.92 ml    PHYSICAL EXAMINATION: General:  Elderly female on vent, calm, cooperative, lying in bed Neuro:  RASS 0 to -1 CAM-ICU negative for delirium. Moves all 4s HEENT:  [place]/AT, ETT in place, No JVD noted Cardiovascular:  RRR, systolic loud murmur. Lungs:  Coarse bilateral breath sounds. No wheeze.  Abdomen:  Soft, non-tender, non-distended Musculoskeletal:  No acute deformity or edema.  Skin:  Grossly intact  LABS: PULMONARY  Recent Labs Lab 01/19/14 1706 01/19/14 2002 01/20/14 0312 01/22/14 0928  PHART 7.205* 7.304* 7.413 7.346*  PCO2ART 71.1* 53.0* 36.0 40.6  PO2ART 131.0* 104.0* 78.6* 298.0*  HCO3 28.1* 25.5* 22.5 22.3  TCO2 30 27.1 23.6 24  O2SAT 98.0 97.1 95.9 100.0    CBC  Recent Labs Lab 01/22/14 0215 01/23/14 0235 01/24/14 0838  HGB 11.3* 10.3* 11.6*  HCT 34.8* 31.2* 33.7*  WBC 11.3* 9.1 11.0*  PLT 192 190 220    COAGULATION No results for input(s): INR in the last 168 hours.  CARDIAC    Recent Labs Lab 01/20/14 1016 01/20/14 1810 01/21/14 0228  TROPONINI <0.03 <0.03 0.03   No results for input(s): PROBNP in the last 168 hours.   CHEMISTRY  Recent Labs Lab 01/19/14 1554 01/20/14 0237  01/21/14 0228  01/22/14 0215 01/22/14 1957 01/23/14 0600 01/23/14 1845 01/24/14 0838 01/24/14 1837  NA 139 138  --  141  --  137  --  135  --   --   --   K 5.0 3.7  --  3.6  --  4.1  --  3.7  --   --   --   CL 106 110  --  111  --  110  --  110  --   --   --   CO2 26 24  --  20  --  22  --  24  --   --   --   GLUCOSE 173* 132*  --  125*  --  103*  --  113*  --   --   --   BUN 15 15  --  13  --  14  --  15  --   --   --   CREATININE 1.26* 0.89  --  0.74  --  0.55  --  0.64  --   --   --   CALCIUM 9.1 7.8*  --  7.5*  --  7.0*  --  7.2*  --   --   --   MG  --  1.9  < > 2.0  < > 1.9 2.4 2.2 2.1 2.1 2.2  PHOS  --  1.7*  < > 1.8*  < > 2.3 1.6* 1.5* 2.6 1.8* 2.2*  < > = values in this interval not displayed. Estimated Creatinine Clearance:  48.3 mL/min (by C-G formula based on Cr of 0.64).   LIVER  Recent Labs Lab 01/23/14 0235  AST 20  ALT 18  ALKPHOS 59  BILITOT 0.6  PROT 4.6*  ALBUMIN 2.1*     INFECTIOUS  Recent Labs Lab 01/19/14 1607 01/20/14 1025 01/21/14 0220 01/21/14 0228 01/22/14 0215  LATICACIDVEN 2.38*  --  1.3  --   --   PROCALCITON  --  0.31  --  0.30 0.18     ENDOCRINE CBG (last 3)   Recent Labs  01/24/14 1905 01/25/14 0012 01/25/14 0352  GLUCAP 140* 159* 132*      IMAGING x48h Dg Chest Port 1 View  01/24/2014   CLINICAL DATA:  Check endotracheal tube, shortness of Breath  EXAM: PORTABLE CHEST - 1 VIEW  COMPARISON:  01/22/2014  FINDINGS: Cardiac shadow is stable. Endotracheal tube is seen 2 cm above the carinal. This is relatively stable in appearance from the prior exam. Nasogastric catheter is seen within the stomach. Increasing right-sided effusion is noted. Increasing left retrocardiac density is also seen.  IMPRESSION: Tubes and lines as described.  Increasing bibasilar changes with effusion on the right and left lower lobe atelectasis.   Electronically Signed   By: Inez Catalina M.D.   On: 01/24/2014 07:47   Dg Abd Portable 1v  01/24/2014   CLINICAL DATA:  81 year old female with enteric tube placement. Initial encounter. Current history of acute respiratory failure.  EXAM: PORTABLE ABDOMEN - 1 VIEW  COMPARISON:  01/22/2014 and earlier.  FINDINGS: Portable AP view at 0509 hrs. Enteric tube re - identified, and the tip has advanced just across midline projecting in the lower abdomen. The side hole projects just to the left of the spine. Interval decreased bowel gas, non obstructed pattern. Mildly increased retrocardiac opacity. No definite pneumoperitoneum on this single image.  IMPRESSION: 1. Enteric tube tip has advanced just across midline in the lower abdomen compatible with antral placement within a ptotic stomach. Side hole at the level of the distal gastric body. 2. Non obstructed  bowel gas pattern. 3. Increased streaky left lung base opacity.   Electronically Signed   By: Lars Pinks M.D.   On: 01/24/2014 07:51  ASSESSMENT / PLAN:  PULMONARY OETT 12/30>01/21/14, 01/22/13 >> A: BaselineHx of COPD NOS - AFL on spiro - follows with By[facility] ? Baseline CXR July 2015 with ?? ILD change but CT 01/20/14 showed onl;y PNA. No ILD Acute resp failure Upper airway trauma /edema   P:   Cont but reduce steroid, cont Nebs Keep same MV on rest Weaned pressure support, if needed back to PS 10 Plan trach today given 4th ETT and stridor , edema airway  pcxr in afternoon   CARDIOVASCULAR A:  Hypotension, likely medication related but could be low at baseline. Elevated lactic acid BNP elevation - 06/2012 echo with LVEF 60-65% P:  Tele MAp goal 55. Weaned off Neo. Keep off Neo for now. If BP decreases, may consider d/cing precedex. Baseline MAP low  RENAL A:   Severe low phos improving with repletion hypophos P:   Mag and phos pending this am. Replete phos Monitor UOP Cont IVF  GASTROINTESTINAL A:   H/o GERD  P:   SUP: IV pepcid TF , dc now for trach afternoon.  HEMATOLOGIC A:   For trach, dvt prevention  P:  Follow CBC Heparin for VTE ppx Check coags as needs trach  - wnl  INFECTIOUS  A:   Leukocytosis CAP vs aspiration - PCT profile c/w localized infection. CT shows pneumonia   - admit trach aspirate with GPC in pairs - repeat trach aspirate sent 01/21/14 due to tan color - normal flora   - currently afebrile and on levaquin  QTC 0.34 per RN on 01/23/14  P:   Abx: Ceftriaxone 12/30 >1231 Abx: Azithromycin 12/30 >12/312 IV levaquin 01/21/14 >>  (plan stop 01/26/14))   Continued plan stop date pcxr follow up  No fevers noted  ENDOCRINE A:   Hyperglycemia without history of DM, likely worsened by steroid.   Under good control now with SSI. P:   CBG and SSI  NEUROLOGIC A:  metabolic encephalopathy at night - resolving?  P:   RASS goal: -1 to  0 wua  FAMILY  - Updates: Niece (HCPOA) and friend updated ad at admit 01/19/2014. Patient updated 01/22/2014.  NEice updated and patient 01/23/14   - Inter-disciplinary family meet or Palliative Care meeting due by: 01/25/2014  SUMMARY Continue steroid, trach today. Check phos and replete. Cont abx.   Dellia Nims, M.D PGY-1 Internal Medicine Pager [phone]   01/25/2014 7:00 AM  STAFF NOTE: Linwood Dibbles, MD FACP have personally reviewed patient's available data, including medical history, events of note, physical examination and test results as part of my evaluation. I have discussed with resident/NP and other care providers such as pharmacist, RN and RRT. In addition, I personally evaluated patient and elicited key findings of: awake, wean attempts, trach cotday, coags wnl, reduce steroids, abx x 1 more day, coarse no sig wheezing The patient is critically ill with multiple organ systems failure and requires high complexity decision making for assessment and support, frequent evaluation and titration of therapies, application of advanced monitoring technologies and extensive interpretation of multiple databases.   Critical Care Time devoted to patient care services described in this note is30 Minutes. This time reflects time of care of this signee: Tanya Roof, MD FACP. This critical care time does not reflect procedure time, or teaching time or supervisory time of PA/NP/Med student/Med Resident etc but could involve care discussion time. Rest per NP/medical resident whose note is outlined above and that I agree with   Tanya Butler. Titus Mould, MD, [place] Pgr:  [phone] [place] [facility] 01/25/2014 11:33 AM

## 2014-01-25 NOTE — Procedures (Signed)
Name:  [name] MRN:  [id number] DOB:  1926/07/09  OPERATIVE NOTE  Procedure:  Percutaneous tracheostomy.  Indications:  Ventilator-dependent respiratory failure.  Consent:  Procedure, alternatives, risks and benefits discussed with medical POA.  Questions answered.  Consent obtained.  Anesthesia: versed, etom, fent  Procedure summary:  Appropriate equipment was assembled.  The patient was identified as Tanya Butler and safety timeout was performed. The patient was placed in supine position with a towel roll behind shoulder blades and neck extended.  Sterile technique was used. The patient's neck and upper chest were prepped using chlorhexidine / alcohol scrub and the field was draped in usual sterile fashion with full body drape. After the adequate sedation / anesthesia was achieved, attention was directed at the midline trachea, where the cricothyroid membrane was palpated. Approximately two fingerbreadths above the sternal notch, a verticle incision was created with a scalpel after local infiltration with 0.2% Lidocaine. Then, using Seldinger technique and a percutaneous tracheostomy set, the trachea was entered with a 14 gauge needle with an overlying sheath. This was all confirmed under direct visualization of a fiberoptic flexible bronchoscope. Entrance into the trachea was identified through the third tracheal ring interspace. Following this, a guidewire was inserted. The needle was removed, leaving the sheath and the guidewire intact. Next, the sheath was removed and a small dilator was inserted. The tracheal rings were then dilated. A #6 Shiley was then opened. The balloon was checked. It was placed over a tracheal dilator, which was then advanced over the guidewire and through the previously dilated tract. The Shiley tracheostomy tube was noted to pass in the trachea with little resistance. The guidewire and dilator tubes were removed from the trachea. An inner cannula was placed  through the tracheostomy tube. The tracheostomy was then secured at the anterior neck with 4 monofilament sutures. The oral endotracheal tube was removed and the ventilator was attached to the newly placed tracheostomy tube. Adequate tidal volumes were noted. The cuff was inflated and no evidence of air leak was noted. No evidence of bleeding was noted. At this point, the procedure was concluded. Post-procedure chest x-ray was ordered.  Complications:  No immediate complications were noted.  Hemodynamic parameters and oxygenation remained stable throughout the procedure.  Estimated blood loss:  Less then 5 mL.  Raylene Miyamoto., MD Pulmonary and [place] Pager: [phone]  01/25/2014, 2:39 PM   Can follow up in trach clinic [phone]

## 2014-01-26 ENCOUNTER — Inpatient Hospital Stay (HOSPITAL_COMMUNITY): Payer: Medicare Other

## 2014-01-26 LAB — GLUCOSE, CAPILLARY
GLUCOSE-CAPILLARY: 119 mg/dL — AB (ref 70–99)
GLUCOSE-CAPILLARY: 101 mg/dL — AB (ref 70–99)
Glucose-Capillary: 117 mg/dL — ABNORMAL HIGH (ref 70–99)
Glucose-Capillary: 119 mg/dL — ABNORMAL HIGH (ref 70–99)
Glucose-Capillary: 131 mg/dL — ABNORMAL HIGH (ref 70–99)
Glucose-Capillary: 139 mg/dL — ABNORMAL HIGH (ref 70–99)

## 2014-01-26 LAB — CULTURE, BLOOD (ROUTINE X 2)
CULTURE: NO GROWTH
Culture: NO GROWTH

## 2014-01-26 MED ORDER — METHYLPREDNISOLONE SODIUM SUCC 40 MG IJ SOLR
40.0000 mg | Freq: Every day | INTRAMUSCULAR | Status: DC
  Administered 2014-01-27: 40 mg via INTRAVENOUS
  Filled 2014-01-26: qty 1

## 2014-01-26 MED ORDER — VITAL AF 1.2 CAL PO LIQD
1000.0000 mL | ORAL | Status: DC
  Administered 2014-01-26: 50 mL
  Administered 2014-01-28 – 2014-02-01 (×4): 1000 mL
  Filled 2014-01-26 (×14): qty 1000

## 2014-01-26 MED ORDER — FAMOTIDINE 40 MG/5ML PO SUSR
20.0000 mg | Freq: Every day | ORAL | Status: DC
  Administered 2014-01-28 – 2014-01-31 (×4): 20 mg via ORAL
  Filled 2014-01-26 (×6): qty 2.5

## 2014-01-26 MED ORDER — FUROSEMIDE 10 MG/ML IJ SOLN
20.0000 mg | Freq: Two times a day (BID) | INTRAMUSCULAR | Status: DC
  Administered 2014-01-26 – 2014-01-27 (×2): 20 mg via INTRAVENOUS
  Filled 2014-01-26 (×4): qty 2

## 2014-01-26 NOTE — Clinical Social Work Note (Signed)
Clinical Social Worker has assessed pt and pt's family. Full psychosocial to follow.  Glendon Axe, MSW, LCSWA [phone] 01/26/2014 3:02 PM

## 2014-01-26 NOTE — Progress Notes (Addendum)
PULMONARY / CRITICAL CARE MEDICINE   Name: Tanya Butler MRN: [id number] DOB: [birth year]    ADMISSION DATE:  01/19/2014 CONSULTATION DATE:  01/19/2014  REFERRING MD :  EDP  CHIEF COMPLAINT:  Respiratory distress  INITIAL PRESENTATION:  81 year old female with past medical history as outlined below, which includes COPD (RB patient), hypertension, skin cancer, and GERD. She presented to [facility] emergency department 01/19/2014 with respiratory distress.  SIGNIFICANT EVENTS: 01/19/2014 -admt 01/20/14 -  Patient  And family denied to RN that there was dust exposure. Patient went upto attic and just got very dyspneic. Failed SBT earlier. CT - with pneumonia. No ILD. RF  And all autoimmune- negative 01/21/14: Extubated. Repeattracheal aspirate sent - 01/22/2014: reintubated earlier due to agitation - seems to have recurent sun downinng. This AM was hypotensive - Rx neo aftter failing fluid bolus.  01/23/14: extubated but re intubated in the afternoon due to stridor  01/23/14 reintubated after developing stridor. Had swollen arytenoid. 01/24/14: No events.  01/25/14: trached. Reduced steroid to 60mg  BID.  SUBJECTIVE/OVERNIGHT/INTERVAL HX 01/26/2014: No events overnight.  VITAL SIGNS: Temp:  [97.6 F (36.4 C)-98.8 F (37.1 C)] 97.9 F (36.6 C) (01/06 0807) Pulse Rate:  [56-100] 77 (01/06 0755) Resp:  [14-25] 17 (01/06 0755) BP: (91-148)/(42-64) 141/55 mmHg (01/06 0755) SpO2:  [96 %-100 %] 98 % (01/06 0755) FiO2 (%):  [40 %] 40 % (01/06 0755) Weight:  [75 kg (165 lb 5.5 oz)] 75 kg (165 lb 5.5 oz) (01/06 0446) HEMODYNAMICS:   VENTILATOR SETTINGS: Vent Mode:  [-] PRVC FiO2 (%):  [40 %] 40 % Set Rate:  [18 bmp-22 bmp] 18 bmp Vt Set:  [460 mL] 460 mL PEEP:  [5 cmH20] 5 cmH20 Pressure Support:  [14 cmH20] 14 cmH20 Plateau Pressure:  [18 cmH20-24 cmH20] 21 cmH20 INTAKE / OUTPUT:  Intake/Output Summary (Last 24 hours) at 01/26/14 0820 Last data filed at 01/26/14 0600  Gross per 24 hour   Intake 1709.78 ml  Output    720 ml  Net 989.78 ml    PHYSICAL EXAMINATION: General:  Elderly female on trach collar , calm, cooperative, lying in bed Neuro:  RASS 0, CAM-ICU negative for delirium. Moves all 4s HEENT:  [place]/AT, trach in place, normal trach site, No JVD noted Cardiovascular:  RRR, systolic loud murmur. Lungs:  Coarse bilateral breath sounds. No wheeze.  Abdomen:  Soft, non-tender, non-distended Musculoskeletal:  No acute deformity or edema.  Skin:  Grossly intact  LABS: PULMONARY  Recent Labs Lab 01/19/14 1706 01/19/14 2002 01/20/14 0312 01/22/14 0928  PHART 7.205* 7.304* 7.413 7.346*  PCO2ART 71.1* 53.0* 36.0 40.6  PO2ART 131.0* 104.0* 78.6* 298.0*  HCO3 28.1* 25.5* 22.5 22.3  TCO2 30 27.1 23.6 24  O2SAT 98.0 97.1 95.9 100.0    CBC  Recent Labs Lab 01/23/14 0235 01/24/14 0838 01/25/14 0702  HGB 10.3* 11.6* 10.2*  HCT 31.2* 33.7* 31.8*  WBC 9.1 11.0* 9.1  PLT 190 220 184    COAGULATION  Recent Labs Lab 01/25/14 0702  INR 1.12    CARDIAC    Recent Labs Lab 01/20/14 1016 01/20/14 1810 01/21/14 0228  TROPONINI <0.03 <0.03 0.03   No results for input(s): PROBNP in the last 168 hours.   CHEMISTRY  Recent Labs Lab 01/20/14 0237  01/21/14 0228  01/22/14 0215  01/23/14 0600 01/23/14 1845 01/24/14 0838 01/24/14 1837 01/25/14 0702  NA 138  --  141  --  137  --  135  --   --   --  139  K 3.7  --  3.6  --  4.1  --  3.7  --   --   --  3.9  CL 110  --  111  --  110  --  110  --   --   --  112  CO2 24  --  20  --  22  --  24  --   --   --  23  GLUCOSE 132*  --  125*  --  103*  --  113*  --   --   --  169*  BUN 15  --  13  --  14  --  15  --   --   --  26*  CREATININE 0.89  --  0.74  --  0.55  --  0.64  --   --   --  0.60  CALCIUM 7.8*  --  7.5*  --  7.0*  --  7.2*  --   --   --  7.3*  MG 1.9  < > 2.0  < > 1.9  < > 2.2 2.1 2.1 2.2 2.4  PHOS 1.7*  < > 1.8*  < > 2.3  < > 1.5* 2.6 1.8* 2.2* 1.8*  < > = values in this interval not  displayed. Estimated Creatinine Clearance: 48.8 mL/min (by C-G formula based on Cr of 0.6).   LIVER  Recent Labs Lab 01/23/14 0235 01/25/14 0702  AST 20  --   ALT 18  --   ALKPHOS 59  --   BILITOT 0.6  --   PROT 4.6*  --   ALBUMIN 2.1*  --   INR  --  1.12     INFECTIOUS  Recent Labs Lab 01/19/14 1607 01/20/14 1025 01/21/14 0220 01/21/14 0228 01/22/14 0215  LATICACIDVEN 2.38*  --  1.3  --   --   PROCALCITON  --  0.31  --  0.30 0.18     ENDOCRINE CBG (last 3)   Recent Labs  01/25/14 1909 01/26/14 0024 01/26/14 0411  GLUCAP 122* 131* 139*      IMAGING x48h Dg Chest Port 1 View  01/25/2014   CLINICAL DATA:  Shortness of breath, tracheostomy placement  EXAM: PORTABLE CHEST - 1 VIEW  COMPARISON:  01/24/2014  FINDINGS: Borderline cardiomegaly. Endotracheal and NG tube has been removed. Tracheostomy tube in place. Slight worsening in aeration with mild interstitial prominence bilaterally suspicious for interstitial edema. Small right pleural effusion with right basilar atelectasis or infiltrate. Trace left pleural effusion with left basilar atelectasis.  IMPRESSION: Tracheostomy tube in place. Slight worsening in aeration with mild interstitial prominence bilaterally suspicious for interstitial edema. Small right pleural effusion with right basilar atelectasis or infiltrate. Trace left pleural effusion with left basilar atelectasis. No pneumothorax.   Electronically Signed   By: Lahoma Crocker M.D.   On: 01/25/2014 15:16   Dg Abd Portable 1v  01/26/2014   CLINICAL DATA:  Encounter for nasogastric tube placement.  EXAM: PORTABLE ABDOMEN - 1 VIEW  COMPARISON:  January 24, 2014.  FINDINGS: The bowel gas pattern is normal. Distal tip of feeding tube is seen in the expected position of the distal stomach. No radio-opaque calculi or other significant radiographic abnormality are seen.  IMPRESSION: Distal tip of feeding tube seen in expected position of distal stomach.   Electronically  Signed   By: Sabino Dick M.D.   On: 01/26/2014 07:35    ASSESSMENT / PLAN:  PULMONARY OETT 12/30>01/21/14, 01/22/14 >>  01/25/14 On trach-vent now. A: BaselineHx of COPD NOS - AFL on spiro - follows with [facility] ? Baseline CXR July 2015 with ?? ILD change but CT 01/20/14 showed onl;y PNA. No ILD Acute resp failure Upper airway trauma /edema   P:   Reduce rapid steroids  Keep same MV on rest Cuff down as goal Trach collar trial goal 4-6 hr Neg balance goals pmv  CARDIOVASCULAR A:  Was hypotensive, now normal. Baseline MAP likely low. BNP elevation - 06/2012 echo with LVEF 60-65% Some interstitial edema on CXR.  P:  Lasix tele  RENAL A:   Severe low phos improving with repletion P:   Mag and phos will recheck now Lasix start Monitor UOP  GASTROINTESTINAL A:   H/o GERD  P:   SUP: IV pepcid Cont TF through panda tube. SLP Cuff down   HEMATOLOGIC A:   For trach, dvt prevention  P:  Follow CBC Heparin for VTE ppx  INFECTIOUS  A:   Leukocytosis CAP vs aspiration - PCT profile c/w localized infection. CT shows pneumonia   - admit trach aspirate with GPC in pairs - repeat trach aspirate sent 01/21/14 due to tan color - normal flora   - currently afebrile on levaquin.  P:   Abx: Ceftriaxone 12/30 >1231 Abx: Azithromycin 12/30 >12/312 IV levaquin 01/21/14 >>  (plan stop 01/26/14)) stop today  No fevers noted  ENDOCRINE A:   Hyperglycemia without history of DM, likely worsened by steroid.   Under good control now with SSI. P:   CBG and SSI Reduce steroids  NEUROLOGIC A:  metabolic encephalopathy at night - resolved  P:   RASS goal: -1 to 0 wua  FAMILY  - Updates: Niece (HCPOA) and friend updated ad at admit 01/19/2014. Patient updated 01/22/2014.  NEice updated and patient 01/23/14   - Inter-disciplinary family meet or Palliative Care meeting due by: 01/25/2014  SUMMARY Continue steroid  reduce, try to wean trach-vent to trach collar. Stop levaquin  today.    Tanya Butler, M.D PGY-1 Internal Medicine Pager [phone]  STAFF NOTE: I, Merrie Roof, MD FACP have personally reviewed patient's available data, including medical history, events of note, physical examination and test results as part of my evaluation. I have discussed with resident/NP and other care providers such as pharmacist, RN and RRT. In addition, I personally evaluated patient and elicited key findings of: in chair on TC, dc precedex NOT needed, slp, PMV, tc to continued, reduce steroids To sdu vent bed, lasix  Lavon Paganini. Titus Mould, MD, [place] Pgr: [place] [facility] 01/26/2014 11:23 AM

## 2014-01-26 NOTE — Progress Notes (Signed)
Patient placed on 40% ATC and tolerating well. No complications noted.  Patient is stable and says she feels comfortable.  RT will continue to monitor.

## 2014-01-26 NOTE — Progress Notes (Signed)
NUTRITION FOLLOW UP  Intervention:    Resume TF via NGT with Vital AF 1.2 at goal rate of 50 ml/h (1200 ml per day) to provide 1440 kcals, 90 gm protein, 973 ml free water daily.  Nutrition Dx:   Inadequate oral intake related to acute respiratory failure as evidenced by NPO/Vent status; ongoing  Goal:   Pt to meet >/= 90% of their estimated nutrition needs; being met  Monitor:   TF initiation/tolerance, Vent status, weight trend, labs  Assessment:   81 year old female with past medical history of COPD, hypertension, skin cancer, and GERD who developed stridor and dyspnea after inhaling unknown substance in her attic 01/19/2014. She was intubated upon EMS arrival and subsequently extubated in the emergency department. However, she developed stridor and hypoxia and was once again intubated.  S/P tracheostomy on 1/5. TF on hold since 1/5 at 7 AM; TF being resumed today. Was on ventilator last night, currently weaning on trach collar.   Height: Ht Readings from Last 1 Encounters:  01/19/14 '5\' 2"'  (1.575 m)    Weight Status:   Wt Readings from Last 1 Encounters:  01/26/14 165 lb 5.5 oz (75 kg)  01/20/14 153 lb  Re-estimated needs:  Kcal: 1300-1500 Protein: 85-100 grams Fluid: 1.5 L  Skin: intact  Diet Order: Diet NPO time specified   Intake/Output Summary (Last 24 hours) at 01/26/14 1214 Last data filed at 01/26/14 1000  Gross per 24 hour  Intake 1663.58 ml  Output    665 ml  Net 998.58 ml    Last BM: None documented since admission  Labs:   Recent Labs Lab 01/22/14 0215  01/23/14 0600  01/24/14 0838 01/24/14 1837 01/25/14 0702  NA 137  --  135  --   --   --  139  K 4.1  --  3.7  --   --   --  3.9  CL 110  --  110  --   --   --  112  CO2 22  --  24  --   --   --  23  BUN 14  --  15  --   --   --  26*  CREATININE 0.55  --  0.64  --   --   --  0.60  CALCIUM 7.0*  --  7.2*  --   --   --  7.3*  MG 1.9  < > 2.2  < > 2.1 2.2 2.4  PHOS 2.3  < > 1.5*  < > 1.8*  2.2* 1.8*  GLUCOSE 103*  --  113*  --   --   --  169*  < > = values in this interval not displayed.  CBG (last 3)   Recent Labs  01/26/14 0024 01/26/14 0411 01/26/14 0809  GLUCAP 131* 139* 119*    Scheduled Meds: . antiseptic oral rinse  7 mL Mouth Rinse QID  . budesonide  0.25 mg Nebulization BID  . famotidine (PEPCID) IV  20 mg Intravenous Q24H  . furosemide  20 mg Intravenous BID  . heparin  5,000 Units Subcutaneous 3 times per day  . insulin aspart  2-6 Units Subcutaneous 6 times per day  . ipratropium-albuterol  3 mL Nebulization TID  . [START ON 01/27/2014] methylPREDNISolone (SOLU-MEDROL) injection  40 mg Intravenous Daily  . propofol  5-80 mcg/kg/min Intravenous Once    Continuous Infusions: . sodium chloride    . dexmedetomidine Stopped (01/26/14 1137)  . feeding supplement (VITAL HIGH PROTEIN)  1,000 mL (01/24/14 1800)  . phenylephrine (NEO-SYNEPHRINE) Adult infusion Stopped (01/24/14 1447)    Molli Barrows, RD, LDN, [place] Pager [phone] After Hours Pager [phone]

## 2014-01-26 NOTE — Progress Notes (Signed)
Physical Therapy Treatment Patient Details Name: Tanya Butler MRN: [id number] DOB: December 15, 1926 Today's Date: 01/26/2014    History of Present Illness 81 year old female with past medical history that includes COPD , hypertension, skin cancer, and GERD. She presented to [facility] emergency department 01/19/2014 with respiratory distress. Earlier that day she was in her attic cleaning was found by her family 15 minutes later, she was having trouble breathing and her lips were noted to be blue. ETT 12/30-1/1, 1/2-1/3 reintubated 1/3. Tracheostomy 1/5.    PT Comments    Progressing towards physical therapy goals. Able to take several steps to chair today, with good tolerance to pre-gait training and exercises. Pt somewhat anxious in standing with notable tremors needing min assist for walker control. Patient will continue to benefit from skilled physical therapy services to further improve independence with functional mobility.   Follow Up Recommendations  CIR;Supervision/Assistance - 24 hour     Equipment Recommendations  Other (comment) (TBD)    Recommendations for Other Services       Precautions / Restrictions Precautions Precautions: Fall Restrictions Weight Bearing Restrictions: No    Mobility  Bed Mobility Overal bed mobility: Needs Assistance Bed Mobility: Supine to Sit     Supine to sit: Min guard     General bed mobility comments: Min guard for safety. VC for technique. Did not require physical assist to sit at edge of bed.  Transfers Overall transfer level: Needs assistance Equipment used: Rolling walker (2 wheeled) Transfers: Sit to/from Stand Sit to Stand: Min assist;+2 safety/equipment         General transfer comment: Min assist for boost to stand +2 for safety and equipment. VC for hand placement.  Performed from lowest bed setting. Good control with descent into chair.  Ambulation/Gait Ambulation/Gait assistance: Min assist;+2  safety/equipment Ambulation Distance (Feet): 3 Feet Assistive device: Rolling walker (2 wheeled) Gait Pattern/deviations: Step-to pattern;Decreased stride length;Shuffle Gait velocity: decreased   General Gait Details: Able to take several steps forward before turning to sit in chair. Had pt perform pre-gait training with standing marching task which she performed well. Min assist for walker control while turning. No loss of balance but notable tremors causing decreased control of RW.   Stairs            Wheelchair Mobility    Modified Rankin (Stroke Patients Only)       Balance                                    Cognition Arousal/Alertness: Awake/alert Behavior During Therapy: Anxious Overall Cognitive Status: Within Functional Limits for tasks assessed                      Exercises General Exercises - Lower Extremity Ankle Circles/Pumps: AROM;Both;10 reps;Supine Quad Sets: Strengthening;Both;10 reps;Seated Long Arc Quad: Strengthening;Both;10 reps;Seated Heel Slides: Strengthening;Both;10 reps;Supine    General Comments        Pertinent Vitals/Pain Pain Assessment: 0-10 Pain Score: 5  Pain Location: throat Pain Descriptors / Indicators: Constant Pain Intervention(s): Monitored during session;Repositioned  BP 214/52 HR 83 SpO2 100% on 10 L via trach collar    Home Living                      Prior Function            PT Goals (current goals can now be found  in the care plan section) Acute Rehab PT Goals PT Goal Formulation: With patient Time For Goal Achievement: 02/07/14 Potential to Achieve Goals: Good Progress towards PT goals: Progressing toward goals    Frequency  Min 3X/week    PT Plan Current plan remains appropriate    Co-evaluation             End of Session Equipment Utilized During Treatment: Gait belt;Oxygen Activity Tolerance: Patient tolerated treatment well Patient left: in chair;with  call bell/phone within reach     Time: 0957-1013 PT Time Calculation (min) (ACUTE ONLY): 16 min  Charges:  $Therapeutic Exercise: 8-22 mins                    G Codes:      Ellouise Newer 2014/02/03, 11:29 AM Elayne Snare, [place]

## 2014-01-27 ENCOUNTER — Inpatient Hospital Stay (HOSPITAL_COMMUNITY): Payer: Medicare Other

## 2014-01-27 DIAGNOSIS — Z978 Presence of other specified devices: Secondary | ICD-10-CM | POA: Insufficient documentation

## 2014-01-27 DIAGNOSIS — J849 Interstitial pulmonary disease, unspecified: Secondary | ICD-10-CM | POA: Insufficient documentation

## 2014-01-27 DIAGNOSIS — Z789 Other specified health status: Secondary | ICD-10-CM

## 2014-01-27 DIAGNOSIS — J9690 Respiratory failure, unspecified, unspecified whether with hypoxia or hypercapnia: Secondary | ICD-10-CM | POA: Insufficient documentation

## 2014-01-27 LAB — CBC
HCT: 36.9 % (ref 36.0–46.0)
Hemoglobin: 12.1 g/dL (ref 12.0–15.0)
MCH: 31.5 pg (ref 26.0–34.0)
MCHC: 32.8 g/dL (ref 30.0–36.0)
MCV: 96.1 fL (ref 78.0–100.0)
PLATELETS: 263 10*3/uL (ref 150–400)
RBC: 3.84 MIL/uL — ABNORMAL LOW (ref 3.87–5.11)
RDW: 15.7 % — ABNORMAL HIGH (ref 11.5–15.5)
WBC: 12.2 10*3/uL — AB (ref 4.0–10.5)

## 2014-01-27 LAB — BASIC METABOLIC PANEL
ANION GAP: 4 — AB (ref 5–15)
BUN: 24 mg/dL — AB (ref 6–23)
CO2: 28 mmol/L (ref 19–32)
CREATININE: 0.55 mg/dL (ref 0.50–1.10)
Calcium: 7.9 mg/dL — ABNORMAL LOW (ref 8.4–10.5)
Chloride: 112 mEq/L (ref 96–112)
GFR calc Af Amer: >90 mL/min (ref >90)
GFR calc non Af Amer: 83 mL/min — ABNORMAL LOW (ref >90)
Glucose, Bld: 114 mg/dL — ABNORMAL HIGH (ref 70–99)
POTASSIUM: 3.8 mmol/L (ref 3.5–5.1)
Sodium: 144 mmol/L (ref 135–145)

## 2014-01-27 LAB — GLUCOSE, CAPILLARY
GLUCOSE-CAPILLARY: 94 mg/dL (ref 70–99)
Glucose-Capillary: 100 mg/dL — ABNORMAL HIGH (ref 70–99)
Glucose-Capillary: 122 mg/dL — ABNORMAL HIGH (ref 70–99)
Glucose-Capillary: 124 mg/dL — ABNORMAL HIGH (ref 70–99)
Glucose-Capillary: 90 mg/dL (ref 70–99)
Glucose-Capillary: 99 mg/dL (ref 70–99)

## 2014-01-27 LAB — MAGNESIUM: Magnesium: 2.3 mg/dL (ref 1.5–2.5)

## 2014-01-27 LAB — PHOSPHORUS: Phosphorus: 1.9 mg/dL — ABNORMAL LOW (ref 2.3–4.6)

## 2014-01-27 MED ORDER — FENTANYL CITRATE 0.05 MG/ML IJ SOLN
25.0000 ug | INTRAMUSCULAR | Status: DC | PRN
  Administered 2014-01-29 – 2014-02-03 (×5): 25 ug via INTRAVENOUS
  Filled 2014-01-27 (×5): qty 2

## 2014-01-27 MED ORDER — PREDNISONE 5 MG/5ML PO SOLN
20.0000 mg | Freq: Every day | ORAL | Status: DC
  Administered 2014-01-28 – 2014-01-31 (×4): 20 mg
  Filled 2014-01-27 (×5): qty 20

## 2014-01-27 MED ORDER — FUROSEMIDE 10 MG/ML IJ SOLN
60.0000 mg | Freq: Two times a day (BID) | INTRAMUSCULAR | Status: DC
  Administered 2014-01-27 – 2014-01-28 (×2): 60 mg via INTRAVENOUS
  Filled 2014-01-27 (×4): qty 6

## 2014-01-27 MED ORDER — POTASSIUM PHOSPHATES 15 MMOLE/5ML IV SOLN
20.0000 mmol | Freq: Once | INTRAVENOUS | Status: AC
  Administered 2014-01-27: 20 mmol via INTRAVENOUS
  Filled 2014-01-27: qty 6.67

## 2014-01-27 NOTE — Progress Notes (Signed)
Pt seen at this time for trach consult.  Pt weaning on 40% ATC and seems to be tolerating well.  Per CSW plan is for pt to be discharged to [facility] vs trach SNF.  No education needed at this time. Will continue to follow for pt progress.

## 2014-01-27 NOTE — Clinical Social Work Psychosocial (Signed)
Clinical Social Work Department BRIEF PSYCHOSOCIAL ASSESSMENT 01/27/2014  Patient:  Tanya Butler, Tanya Butler     Account Number:  [id number]     Admit date:  01/19/2014  Clinical Social Worker:  Glendon Axe, CLINICAL SOCIAL WORKER  Date/Time:  01/27/2014 11:17 AM  Referred by:  Physician  Date Referred:  01/27/2014 Referred for  SNF Placement   Other Referral:   Interview type:  Other - See comment Other interview type:   Clinical Social Worker spoke with pt's niece, Diane via telephone.    PSYCHOSOCIAL DATA Living Status:  ALONE Admitted from facility:   Level of care:   Primary support name:  Donnella Sham Primary support relationship to patient:  FAMILY Degree of support available:   Strong    CURRENT CONCERNS Current Concerns  Post-Acute Placement   Other Concerns:    SOCIAL WORK ASSESSMENT / PLAN Clinical Social Worker spoke with pt's niece at length in reference to post-acute placement for SNF. CSW explained CSW role and SNF process. Pt's neice reported she is agreeable to SNF placement and would like [place] on [place] Pt is on ventilator and has new tract. CSW faxed pt's clinicals to SNF's in [place] will continue to follow pt and pt's family for continued support and to facilitate pt's discharge once medically stable.   Assessment/plan status:  Psychosocial Support/Ongoing Assessment of Needs Other assessment/ plan:   Information/referral to community resources:   SNF information.    PATIENT'S/FAMILY'S RESPONSE TO PLAN OF CARE: Pt on ventilator and unable to participate in assessment. Pt's niece agreeable to SNF placement and appreciated social work intervention.    Glendon Axe, MSW, LCSWA [phone] 01/27/2014 11:35 AM

## 2014-01-27 NOTE — Progress Notes (Signed)
UR Completed.  [phone]  

## 2014-01-27 NOTE — Evaluation (Signed)
[facility]y-[facility] Speaking Valve - Evaluation Patient Details  Name: Tanya Butler MRN: [id number] Date of Birth: [birth year]  Today's Date: 01/27/2014 Time: 0623-7628 SLP Time Calculation (min) (ACUTE ONLY): 28 min  Past Medical History:  Past Medical History  Diagnosis Date  . HTN (hypertension)   . Hyperlipidemia   . Restless leg syndrome   . Osteoporosis   . GERD (gastroesophageal reflux disease)   . COPD (chronic obstructive pulmonary disease)   . Hypertonicity of bladder   . Skin cancer    Past Surgical History:  Past Surgical History  Procedure Laterality Date  . Skin cancer excision    . Esophagogastroduodenoscopy N/A 07/15/2012    Procedure: ESOPHAGOGASTRODUODENOSCOPY (EGD);  Surgeon: Wonda Horner, MD;  Location: [facility] ENDOSCOPY;  Service: Endoscopy;  Laterality: N/A;  . Esophagogastroduodenoscopy N/A 07/22/2012    Procedure: ESOPHAGOGASTRODUODENOSCOPY (EGD);  Surgeon: Missy Sabins, MD;  Location: [facility] ENDOSCOPY;  Service: Endoscopy;  Laterality: N/A;   HPI:  81 year old female with past medical history as outlined below, which includes COPD (RB patient), hypertension, skin cancer, and GERD. She presented to [facility] emergency department 01/19/2014 with respiratory distress. Pt was intubated 12/30-79/1/1, 1/2-1/3, and again 1/3 until she received trach on 1/5. Pt currently with #79 cuffed Shiley and initiated TCT 79/6.   Assessment / Plan / Recommendation Clinical Impression  Pt tolerated PMSV for ~25 minutes, acheiving adequate phonation with Mod cues from SLP for increased vocal intensity and chunking phrases to facilitate moderately reduced breath support. Pt was able to orally expectorate secretions both with the cuff deflated and with PMSV in place. HR and SpO2 remained stable throughout, with mildly elevated RR at baseline and during trials. Given the amount of secretions and elevated RR, recommend initial full supervision with PMSV. RN informed and instructed SLP to reinflate valve  upon departure. SLP will continue to follow and anticipates good prognosis for tolerance.    SLP Assessment  Patient needs continued Speech Lanaguage Pathology Services    Follow Up Recommendations  Inpatient Rehab    Frequency and Duration min 79x/week  79 weeks   Pertinent Vitals/Pain See above    SLP Goals Potential to Achieve Goals (ACUTE ONLY): Good   PMSV Trial  PMSV was placed for: 79 minutes Able to redirect subglottic air through upper airway: Yes Able to Attain Phonation: Yes Voice Quality: Hoarse;Low vocal intensity Able to Expectorate Secretions: Yes Level of Secretion Expectoration with PMSV: Oral Breath Support for Phonation: Moderately decreased Intelligibility: Intelligibility reduced Word: 79-100% accurate Phrase: 79-100% accurate Sentence: 50-79% accurate Respirations During Trial: 79 SpO2 During Trial: 95 % Pulse During Trial: 112 Behavior: Alert;Controlled;Cooperative;Expresses self well;Good eye contact;Responsive to questions   Tracheostomy Tube       Vent Dependency  FiO2 (%): 40 %    Cuff Deflation Trial Tolerated Cuff Deflation: Yes Length of Time for Cuff Deflation Trial: 35 Behavior: Alert;Cooperative;Good eye contact;Expresses self well;Smiling;Responsive to questions     Tanya Butler, M.A. CCC-SLP [phone]  Tanya Butler 01/27/2014, 2:42 PM

## 2014-01-27 NOTE — Evaluation (Signed)
Clinical/Bedside Swallow Evaluation Patient Details  Name: Tanya Butler MRN: [id number] Date of Birth: May 26, 1926  Today's Date: 01/27/2014 Time: 9622-2979 SLP Time Calculation (min) (ACUTE ONLY): 14 min  Past Medical History:  Past Medical History  Diagnosis Date  . HTN (hypertension)   . Hyperlipidemia   . Restless leg syndrome   . Osteoporosis   . GERD (gastroesophageal reflux disease)   . COPD (chronic obstructive pulmonary disease)   . Hypertonicity of bladder   . Skin cancer    Past Surgical History:  Past Surgical History  Procedure Laterality Date  . Skin cancer excision    . Esophagogastroduodenoscopy N/A 07/15/2012    Procedure: ESOPHAGOGASTRODUODENOSCOPY (EGD);  Surgeon: Wonda Horner, MD;  Location: [facility] ENDOSCOPY;  Service: Endoscopy;  Laterality: N/A;  . Esophagogastroduodenoscopy N/A 07/22/2012    Procedure: ESOPHAGOGASTRODUODENOSCOPY (EGD);  Surgeon: Missy Sabins, MD;  Location: [facility] ENDOSCOPY;  Service: Endoscopy;  Laterality: N/A;   HPI:  81 year old female with past medical history as outlined below, which includes COPD (RB patient), hypertension, skin cancer, and GERD. She presented to [facility] emergency department 01/19/2014 with respiratory distress. Pt was intubated 12/30-1/1, 1/2-1/3, and again 1/3 until she received trach on 1/5. Pt currently with #6 cuffed Shiley and initiated TCT 1/6.   Assessment / Plan / Recommendation Clinical Impression  Pt presents with evidence of airway compromise, including wet baseline vocal quality and immediate throat clearing s/p ice chip trials with PMSV in place. Risk factors include baseline COPD with respiratory distress leading to multiple intubations and ultimate trach placement. Given the above as well as decreased ability to maintain secretions, recommend to proceed with FEES to objectively assess current aspiration risk prior to initiating PO diet.    Aspiration Risk  Severe    Diet Recommendation NPO   Medication  Administration: Via alternative means    Other  Recommendations Recommended Consults: FEES Oral Care Recommendations: Oral care Q4 per protocol   Follow Up Recommendations  Inpatient Rehab    Frequency and Duration min 2x/week      Pertinent Vitals/Pain wfl    SLP Swallow Goals     Swallow Study Prior Functional Status       General HPI: 81 year old female with past medical history as outlined below, which includes COPD (RB patient), hypertension, skin cancer, and GERD. She presented to [facility] emergency department 01/19/2014 with respiratory distress. Pt was intubated 12/30-1/1, 1/2-1/3, and again 1/3 until she received trach on 1/5. Pt currently with #6 cuffed Shiley and initiated TCT 1/6. Type of Study: Bedside swallow evaluation Previous Swallow Assessment: none in chart Diet Prior to this Study: NPO;Panda Temperature Spikes Noted: Yes (low grade) Respiratory Status: Trach collar Trach Size and Type: #6;Deflated;With PMSV in place;Cuff History of Recent Intubation: Yes Length of Intubations (days): 7 days (across 3 intubations) Date extubated:  (trach 01/25/14) Behavior/Cognition: Alert;Cooperative;Pleasant mood Oral Cavity - Dentition: Adequate natural dentition Patient Positioning: Upright in bed Baseline Vocal Quality: Hoarse;Low vocal intensity Volitional Cough: Strong    Oral/Motor/Sensory Function Overall Oral Motor/Sensory Function: Appears within functional limits for tasks assessed   Ice Chips Ice chips: Impaired Presentation: Spoon Pharyngeal Phase Impairments: Throat Clearing - Immediate;Wet Vocal Quality   Thin Liquid Thin Liquid: Not tested    Nectar Thick Nectar Thick Liquid: Not tested   Honey Thick Honey Thick Liquid: Not tested   Puree Puree: Not tested   Solid   GO    Solid: Not tested        [name]  Paiewonsky, M.A. CCC-SLP [phone]  [name] 01/27/2014,2:51 PM

## 2014-01-27 NOTE — Clinical Social Work Placement (Addendum)
Clinical Social Work Department CLINICAL SOCIAL WORK PLACEMENT NOTE 01/27/2014  Patient:  Tanya Butler, Tanya Butler  Account Number:  [id number] Admit date:  01/19/2014  Clinical Social Worker:  Glendon Axe, CLINICAL SOCIAL WORKER  Date/time:  01/27/2014 11:36 AM  Clinical Social Work is seeking post-discharge placement for this patient at the following level of care:   [place]   (*CSW will update this form in Epic as items are completed)   01/27/2014  Patient/family provided with [facility] Department of Clinical Social Work's list of facilities offering this level of care within the geographic area requested by the patient (or if unable, by the patient's family).  01/27/2014  Patient/family informed of their freedom to choose among providers that offer the needed level of care, that participate in Medicare, Medicaid or managed care program needed by the patient, have an available bed and are willing to accept the patient.  01/27/2014  Patient/family informed of [facility]' ownership interest in [facility], as well as of the fact that they are under no obligation to receive care at this facility.  PASARR submitted to EDS on EXISTING  PASARR number received on EXISTING  FL2 transmitted to all facilities in geographic area requested by pt/family on  01/27/2014 FL2 transmitted to all facilities within larger geographic area on 01/27/2014  Patient informed that his/her managed care company has contracts with or will negotiate with  certain facilities, including the following:   YES     Patient/family informed of bed offers received:  01/27/2014 Patient chooses bed at  Physician recommends and patient chooses bed at    Patient to be transferred to  on   Patient to be transferred to facility by  Patient and family notified of transfer on  Name of family member notified:    The following physician request were entered in Epic:   Additional Comments:   Glendon Axe, MSW, LCSWA [phone] 01/27/2014 11:37 AM

## 2014-01-27 NOTE — Trach Care Team (Signed)
[facility] Progression Note   Patient Details Name: Tanya Butler MRN: [id number] DOB: 1926-08-21 Today's Date: 01/27/2014   Tracheostomy Assessment    Tracheostomy Shiley 6 mm Cuffed (Active)  Status Secured 01/27/2014 11:34 AM  Site Assessment Dry;Clean 01/27/2014  8:00 AM  Site Care Other (Comment) 01/27/2014  8:00 AM  Inner Cannula Care Changed/new 01/27/2014  8:00 AM  Ties Assessment Secure 01/27/2014 11:34 AM  Cuff pressure (cm) 26 cm 01/27/2014  3:30 AM  Emergency Equipment at bedside Yes 01/27/2014 11:34 AM     Care Needs Suture removal post-op day #7 : 02/01/14   Respiratory Therapy O2 Device: Tracheostomy Collar FiO2 (%): 40 % SpO2: 95 % Education:  (none needed at this time) Follow up recommendations:  (will continue to follow for pt progress) Respiratory barriers to progression:  (pt currently weaning well on ATC trial )    Speech Language Pathology      Physical Therapy Ambulation/Gait assistance: Min assist, +2 safety/equipment PT Recommendation/Assessment: Patient needs continued PT services Follow Up Recommendations: CIR, Supervision/Assistance - 24 hour PT equipment: Other (comment) (TBD)    Occupational Therapy      Nutritional Patient's Current Diet: Tube feeding Tube Feeding: Vital High Protein Tube Feeding Frequency: Continuous Tube Feeding Strength: Full strength    Case Management/Social Work Level of patient care prior to hospitalization: Home-Self care Living status: Alone Insurance payer: Medicare [facility]) Anticipated discharge disposition: SNF/Assisted  living facility    Provider [place] Team/Provider Recommendations [place] Team Members Present-  Ciro Backer, RT, Alvino Blood, SW, Molli Barrows, RD, Herbie [place], SLP, Deveron Furlong, CM, [place], [place]   Marni Griffon, NP Consider downsize of trach next week - Wed/Thurs.          Urvi Imes, Jaci Carrel (scribe for team) 01/27/2014, 2:36 PM

## 2014-01-27 NOTE — Progress Notes (Signed)
PULMONARY / CRITICAL CARE MEDICINE   Name: Tanya Butler MRN: [id number] DOB: [birth year]    ADMISSION DATE:  01/19/2014 CONSULTATION DATE:  01/19/2014  REFERRING MD :  EDP  CHIEF COMPLAINT:  Respiratory distress  INITIAL PRESENTATION:  81 year old female with past medical history as outlined below, which includes COPD (RB patient), hypertension, skin cancer, and GERD. She presented to [facility] emergency department 01/19/2014 with respiratory distress.  SIGNIFICANT EVENTS: 01/19/2014 -admt 01/20/14 -  Patient  And family denied to RN that there was dust exposure. Patient went upto attic and just got very dyspneic. Failed SBT earlier. CT - with pneumonia. No ILD. RF  And all autoimmune- negative 01/21/14: Extubated. Repeattracheal aspirate sent - 01/22/2014: reintubated earlier due to agitation - seems to have recurent sun downinng. This AM was hypotensive - Rx neo aftter failing fluid bolus.  01/23/14: extubated but re intubated in the afternoon due to stridor  01/23/14 reintubated after developing stridor. Had swollen arytenoid. 01/24/14: No events.  01/25/14: trached. Reduced steroid to 60mg  BID. 01/26/14: remained on trach collar.  SUBJECTIVE/OVERNIGHT/INTERVAL HX 01/27/2014: No events overnight. Remained on trach collar doing well  VITAL SIGNS: Temp:  [97.9 F (36.6 C)-99.6 F (37.6 C)] 99.6 F (37.6 C) (01/07 0447) Pulse Rate:  [59-113] 97 (01/07 0700) Resp:  [17-33] 31 (01/07 0700) BP: (122-158)/(50-134) 145/54 mmHg (01/07 0700) SpO2:  [92 %-100 %] 95 % (01/07 0700) FiO2 (%):  [40 %] 40 % (01/07 0330) Weight:  [73.4 kg (161 lb 13.1 oz)] 73.4 kg (161 lb 13.1 oz) (01/07 0332) HEMODYNAMICS:   VENTILATOR SETTINGS: Vent Mode:  [-] PRVC FiO2 (%):  [40 %] 40 % Set Rate:  [18 bmp] 18 bmp Vt Set:  [460 mL] 460 mL PEEP:  [5 cmH20] 5 cmH20 Plateau Pressure:  [21 cmH20] 21 cmH20 INTAKE / OUTPUT:  Intake/Output Summary (Last 24 hours) at 01/27/14 0711 Last data filed at 01/27/14  0600  Gross per 24 hour  Intake 1221.8 ml  Output   1295 ml  Net  -73.2 ml    PHYSICAL EXAMINATION: General:  Elderly female on trach collar, and panda tube , calm, cooperative, lying in bed Neuro:  RASS 0, CAM-ICU negative for delirium. Moves all 4s HEENT:  [place]/AT, trach in place, normal trach site, No JVD noted Cardiovascular:  RRR, systolic loud murmur. Lungs:  Coarse bilateral breath sounds. No wheeze.  Abdomen:  Soft, non-tender, non-distended Musculoskeletal:  No acute deformity or edema.  Skin:  Grossly intact  LABS: PULMONARY  Recent Labs Lab 01/22/14 0928  PHART 7.346*  PCO2ART 40.6  PO2ART 298.0*  HCO3 22.3  TCO2 24  O2SAT 100.0    CBC  Recent Labs Lab 01/23/14 0235 01/24/14 0838 01/25/14 0702  HGB 10.3* 11.6* 10.2*  HCT 31.2* 33.7* 31.8*  WBC 9.1 11.0* 9.1  PLT 190 220 184    COAGULATION  Recent Labs Lab 01/25/14 0702  INR 1.12    CARDIAC    Recent Labs Lab 01/20/14 1016 01/20/14 1810 01/21/14 0228  TROPONINI <0.03 <0.03 0.03   No results for input(s): PROBNP in the last 168 hours.   CHEMISTRY  Recent Labs Lab 01/21/14 0228  01/22/14 0215  01/23/14 0600 01/23/14 1845 01/24/14 0838 01/24/14 1837 01/25/14 0702 01/27/14 0305  NA 141  --  137  --  135  --   --   --  139 144  K 3.6  --  4.1  --  3.7  --   --   --  3.9 3.8  CL 111  --  110  --  110  --   --   --  112 112  CO2 20  --  22  --  24  --   --   --  23 28  GLUCOSE 125*  --  103*  --  113*  --   --   --  169* 114*  BUN 13  --  14  --  15  --   --   --  26* 24*  CREATININE 0.74  --  0.55  --  0.64  --   --   --  0.60 0.55  CALCIUM 7.5*  --  7.0*  --  7.2*  --   --   --  7.3* 7.9*  MG 2.0  < > 1.9  < > 2.2 2.1 2.1 2.2 2.4 2.3  PHOS 1.8*  < > 2.3  < > 1.5* 2.6 1.8* 2.2* 1.8* 1.9*  < > = values in this interval not displayed. Estimated Creatinine Clearance: 48.2 mL/min (by C-G formula based on Cr of 0.55).   LIVER  Recent Labs Lab 01/23/14 0235 01/25/14 0702   AST 20  --   ALT 18  --   ALKPHOS 59  --   BILITOT 0.6  --   PROT 4.6*  --   ALBUMIN 2.1*  --   INR  --  1.12     INFECTIOUS  Recent Labs Lab 01/20/14 1025 01/21/14 0220 01/21/14 0228 01/22/14 0215  LATICACIDVEN  --  1.3  --   --   PROCALCITON 0.31  --  0.30 0.18     ENDOCRINE CBG (last 3)   Recent Labs  01/26/14 1925 01/27/14 0034 01/27/14 0445  GLUCAP 101* 99 100*      IMAGING x48h Dg Chest Port 1 View  01/25/2014   CLINICAL DATA:  Shortness of breath, tracheostomy placement  EXAM: PORTABLE CHEST - 1 VIEW  COMPARISON:  01/24/2014  FINDINGS: Borderline cardiomegaly. Endotracheal and NG tube has been removed. Tracheostomy tube in place. Slight worsening in aeration with mild interstitial prominence bilaterally suspicious for interstitial edema. Small right pleural effusion with right basilar atelectasis or infiltrate. Trace left pleural effusion with left basilar atelectasis.  IMPRESSION: Tracheostomy tube in place. Slight worsening in aeration with mild interstitial prominence bilaterally suspicious for interstitial edema. Small right pleural effusion with right basilar atelectasis or infiltrate. Trace left pleural effusion with left basilar atelectasis. No pneumothorax.   Electronically Signed   By: Lahoma Crocker M.D.   On: 01/25/2014 15:16   Dg Abd Portable 1v  01/26/2014   CLINICAL DATA:  Encounter for nasogastric tube placement.  EXAM: PORTABLE ABDOMEN - 1 VIEW  COMPARISON:  January 24, 2014.  FINDINGS: The bowel gas pattern is normal. Distal tip of feeding tube is seen in the expected position of the distal stomach. No radio-opaque calculi or other significant radiographic abnormality are seen.  IMPRESSION: Distal tip of feeding tube seen in expected position of distal stomach.   Electronically Signed   By: Sabino Dick M.D.   On: 01/26/2014 07:35    ASSESSMENT / PLAN:  PULMONARY OETT 12/30>01/21/14, 01/22/14 >> 01/25/14  A: BaselineHx of COPD NOS - AFL on spiro -  follows with [facility] ? Baseline CXR July 2015 with ?? ILD change but CT 01/20/14 showed onl;y PNA. No ILD Acute resp failure Upper airway trauma /edema  On trach collar now since 01/25/14. Tolerating fine.  P:   Cont  reduced steroid - 40 solumedrol taper Will get CXR this AM to evaluate volume status.  Keep same MV on rest Neg balance goals, increase lasix pmv  CARDIOVASCULAR A:  Was hypotensive, now normal. Baseline MAP likely low. BNP elevation - 06/2012 echo with LVEF 60-65% Some interstitial edema on CXR.  P:  Cont lasix. Increase to 60mg  BID today and maintain negative balance. Didn't diurese much yesterday.  tele  RENAL A:   Severe low phos improving with repletion P:   Replete phos PRN. Gave 42mmol kphos IV. Monitor UOP bmet in am   GASTROINTESTINAL A:   H/o GERD  P:   SUP: IV pepcid Cont TF through panda tube. SLP - not done yet.  Ensure cuff down trach Avoid peg!! As progressing off vent  HEMATOLOGIC A:   dvt prevention  P:  Heparin for VTE ppx Pt to ambulate  INFECTIOUS  A:   Leukocytosis CAP vs aspiration - PCT profile c/w localized infection. CT showed pneumonia   - admit trach aspirate with GPC in pairs repeat trach aspirate sent 01/21/14 due to tan color - normal flora   - currently afebrile. Was treated with abx with levaquin until 01/26/14.   P:   Abx: Ceftriaxone 12/30 >1231 Abx: Azithromycin 12/30 >12/312 IV levaquin 01/21/14 >> stopped 1/6.   No fevers noted  ENDOCRINE A:   Hyperglycemia without history of DM, likely worsened by steroid.   Under good control now with SSI. P:   CBG and SSI Reduce steroids to off as goal  NEUROLOGIC A:  metabolic encephalopathy at night - resolved  P:   RASS goal: -1 to 0 wua Active PT / OT  FAMILY  - Updates: Niece (HCPOA) and friend updated ad at admit 01/19/2014. Patient updated 01/22/2014.  NEice updated and patient 01/23/14   - Inter-disciplinary family meet or Palliative Care meeting due  by: 01/25/2014  SUMMARY Continue steroid  reduced, keep on trach collar. Step down pending.    Dellia Nims, M.D PGY-1 Internal Medicine Pager [phone]  STAFF NOTE: I, Merrie Roof, MD FACP have personally reviewed patient's available data, including medical history, events of note, physical examination and test results as part of my evaluation. I have discussed with resident/NP and other care providers such as pharmacist, RN and RRT. In addition, I personally evaluated patient and elicited key findings of: reduce steroids to off, trach collar well, slp, cuff down, no distress, increase lasix  To transfer  Lavon Paganini. Titus Mould, MD, [place] Pgr: [place] [facility] 01/27/2014 12:29 PM

## 2014-01-28 ENCOUNTER — Inpatient Hospital Stay (HOSPITAL_COMMUNITY): Payer: Medicare Other

## 2014-01-28 DIAGNOSIS — Z4659 Encounter for fitting and adjustment of other gastrointestinal appliance and device: Secondary | ICD-10-CM | POA: Insufficient documentation

## 2014-01-28 LAB — BASIC METABOLIC PANEL
Anion gap: 5 (ref 5–15)
BUN: 22 mg/dL (ref 6–23)
CHLORIDE: 103 meq/L (ref 96–112)
CO2: 34 mmol/L — ABNORMAL HIGH (ref 19–32)
CREATININE: 0.61 mg/dL (ref 0.50–1.10)
Calcium: 8.2 mg/dL — ABNORMAL LOW (ref 8.4–10.5)
GFR calc Af Amer: >90 mL/min (ref >90)
GFR, EST NON AFRICAN AMERICAN: 80 mL/min — AB (ref >90)
GLUCOSE: 126 mg/dL — AB (ref 70–99)
Potassium: 3.6 mmol/L (ref 3.5–5.1)
Sodium: 142 mmol/L (ref 135–145)

## 2014-01-28 LAB — GLUCOSE, CAPILLARY
GLUCOSE-CAPILLARY: 101 mg/dL — AB (ref 70–99)
GLUCOSE-CAPILLARY: 119 mg/dL — AB (ref 70–99)
Glucose-Capillary: 135 mg/dL — ABNORMAL HIGH (ref 70–99)
Glucose-Capillary: 156 mg/dL — ABNORMAL HIGH (ref 70–99)
Glucose-Capillary: 137 mg/dL — ABNORMAL HIGH (ref 70–99)
Glucose-Capillary: 119 mg/dL — ABNORMAL HIGH (ref 70–99)

## 2014-01-28 LAB — PHOSPHORUS: Phosphorus: 2 mg/dL — ABNORMAL LOW (ref 2.3–4.6)

## 2014-01-28 LAB — MAGNESIUM: MAGNESIUM: 2.2 mg/dL (ref 1.5–2.5)

## 2014-01-28 LAB — TRIGLYCERIDES: TRIGLYCERIDES: 72 mg/dL (ref <150)

## 2014-01-28 MED ORDER — POTASSIUM CHLORIDE 20 MEQ/15ML (10%) PO SOLN
20.0000 meq | Freq: Two times a day (BID) | ORAL | Status: DC
  Administered 2014-01-28 – 2014-01-31 (×5): 20 meq
  Filled 2014-01-28 (×9): qty 15

## 2014-01-28 MED ORDER — POTASSIUM PHOSPHATES 15 MMOLE/5ML IV SOLN
20.0000 mmol | Freq: Once | INTRAVENOUS | Status: AC
  Administered 2014-01-28: 20 mmol via INTRAVENOUS
  Filled 2014-01-28: qty 6.67

## 2014-01-28 MED ORDER — FUROSEMIDE 10 MG/ML IJ SOLN
20.0000 mg | Freq: Two times a day (BID) | INTRAMUSCULAR | Status: DC
  Administered 2014-01-28 – 2014-01-31 (×6): 20 mg via INTRAVENOUS
  Filled 2014-01-28 (×6): qty 2

## 2014-01-28 MED ORDER — POTASSIUM CHLORIDE 20 MEQ/15ML (10%) PO SOLN
20.0000 meq | ORAL | Status: DC
  Administered 2014-01-28: 20 meq
  Filled 2014-01-28: qty 15

## 2014-01-28 NOTE — Clinical Social Work Note (Signed)
Patient discussed during progression. Patient is off ventilator with trach collar at 40%. Trach to be at 28% upon discharge. Clinical Social Worker actively searching for SNF placement. CSW will update FL-2 as needed and re-fax to SNF's.   CSW continues to follow pt and pt's family for continued support and to facilitate pt's discharge one medically stable.   Glendon Axe, MSW, LCSWA [phone] 01/28/2014 11:51 AM

## 2014-01-28 NOTE — Progress Notes (Signed)
Tanya Butler is a 81 y.o. female with history of HTN, GERD, COPD who was admitted on 01/19/14 with respiratory distress while cleaning her attic. She was intubated in field and was awake and alert in ED on admission. She was extubated but shortly developed stridor with hypoxia requiring reintubation. She was noted to have leucocytosis and was started on IV antibiotics for CAP v/s aspiration.  Chest CT showed evidence of RLL PNA and mild interstitial edema. Has been treated with IV lasix for diuresis.  She failed attempts at extubation requiring trach on 01/25/14 and is currently tolerating trach collar. ST evaluation done yesterday and patient started on PMSV trials and to continue NPO status due to decreased ability to maintain oral secretions. PT ongoing and patient with good motivation but requires venturi face mask for adequate oxygenation with activity.   She has [employer] who will not cover CIR for current diagnosis. Patient is making progress with therapy and would recommend SNF/LTAC for follow up therapy once stable for discharge. Note that SW is working with family on this disposition. Will defer CIR consult for now.

## 2014-01-28 NOTE — Progress Notes (Signed)
[facility] ADULT ICU REPLACEMENT PROTOCOL FOR AM LAB REPLACEMENT ONLY  The patient does apply for the [facility] Adult ICU Electrolyte Replacment Protocol based on the criteria listed below:   1. Is GFR >/= 40 ml/min? Yes.    Patient's GFR today is 80 2. Is urine output >/= 0.5 ml/kg/hr for the last 6 hours? Yes.   Patient's UOP is 1.7 ml/kg/hr 3. Is BUN < 60 mg/dL? Yes.    Patient's BUN today is 22 4. Abnormal electrolyte(s):K3.6) 5. Ordered repletion with: 39meq/Kcl/tube 6. If a panic level lab has been reported, has the CCM MD in charge been notified? Yes.  .   Physician:  Lupita Shutter 01/28/2014 6:18 AM

## 2014-01-28 NOTE — Progress Notes (Addendum)
PULMONARY / CRITICAL CARE MEDICINE   Name: Tanya Butler MRN: [id number] DOB: 03-01-1926    ADMISSION DATE:  01/19/2014 CONSULTATION DATE:  01/19/2014  REFERRING MD :  EDP  CHIEF COMPLAINT:  Respiratory distress  INITIAL PRESENTATION:  81 year old female with past medical history as outlined below, which includes COPD (RB patient), hypertension, skin cancer, and GERD. She presented to [facility] emergency department 01/19/2014 with respiratory distress.  SIGNIFICANT EVENTS: 01/19/2014 -admt 01/20/14 -  Patient  And family denied to RN that there was dust exposure. Patient went upto attic and just got very dyspneic. Failed SBT earlier. CT - with pneumonia. No ILD. RF  And all autoimmune- negative 01/21/14: Extubated. Repeattracheal aspirate sent - 01/22/2014: reintubated earlier due to agitation - seems to have recurent sun downinng. This AM was hypotensive - Rx neo aftter failing fluid bolus.  01/23/14: extubated but re intubated in the afternoon due to stridor  01/23/14 reintubated after developing stridor. Had swollen arytenoid. 01/24/14: No events.  01/25/14: trached. Reduced steroid to 60mg  BID. 1/6 and 1/7: remained on trach collar. Reduced steroid to 20 daily.  SUBJECTIVE/OVERNIGHT/INTERVAL HX 01/28/2014: No events overnight. Remained on trach collar doing well. Diuresed very well with lasix.   VITAL SIGNS: Temp:  [98.4 F (36.9 C)-99.2 F (37.3 C)] 99.1 F (37.3 C) (01/08 0431) Pulse Rate:  [86-104] 101 (01/08 0615) Resp:  [20-34] 25 (01/08 0615) BP: (99-150)/(41-64) 128/44 mmHg (01/08 0600) SpO2:  [88 %-98 %] 93 % (01/08 0615) FiO2 (%):  [35 %-40 %] 40 % (01/08 0400) Weight:  [67.8 kg (149 lb 7.6 oz)] 67.8 kg (149 lb 7.6 oz) (01/08 0500) HEMODYNAMICS:   VENTILATOR SETTINGS: Vent Mode:  [-]  FiO2 (%):  [35 %-40 %] 40 % INTAKE / OUTPUT:  Intake/Output Summary (Last 24 hours) at 01/28/14 0719 Last data filed at 01/28/14 0640  Gross per 24 hour  Intake    980 ml  Output    4625 ml  Net  -3645 ml    PHYSICAL EXAMINATION: General:  Elderly female on trach collar, and panda tube , calm, cooperative, lying in bed Neuro:  RASS 0, CAM-ICU negative for delirium. Moves all 4s HEENT:  [place]/AT, trach in place, normal trach site, No JVD noted Cardiovascular:  RRR, systolic loud murmur. Lungs:  Coarse bilateral breath sounds. No wheeze.  Abdomen:  Soft, non-tender, non-distended Musculoskeletal:  No acute deformity or edema.  Skin:  Grossly intact  LABS: PULMONARY  Recent Labs Lab 01/22/14 0928  PHART 7.346*  PCO2ART 40.6  PO2ART 298.0*  HCO3 22.3  TCO2 24  O2SAT 100.0    CBC  Recent Labs Lab 01/24/14 0838 01/25/14 0702 01/27/14 0915  HGB 11.6* 10.2* 12.1  HCT 33.7* 31.8* 36.9  WBC 11.0* 9.1 12.2*  PLT 220 184 263    COAGULATION  Recent Labs Lab 01/25/14 0702  INR 1.12    CARDIAC   No results for input(s): TROPONINI in the last 168 hours. No results for input(s): PROBNP in the last 168 hours.   CHEMISTRY  Recent Labs Lab 01/22/14 0215  01/23/14 0600  01/24/14 0838 01/24/14 1837 01/25/14 0702 01/27/14 0305 01/28/14 0327  NA 137  --  135  --   --   --  139 144 142  K 4.1  --  3.7  --   --   --  3.9 3.8 3.6  CL 110  --  110  --   --   --  112 112 103  CO2 22  --  24  --   --   --  23 28 34*  GLUCOSE 103*  --  113*  --   --   --  169* 114* 126*  BUN 14  --  15  --   --   --  26* 24* 22  CREATININE 0.55  --  0.64  --   --   --  0.60 0.55 0.61  CALCIUM 7.0*  --  7.2*  --   --   --  7.3* 7.9* 8.2*  MG 1.9  < > 2.2  < > 2.1 2.2 2.4 2.3 2.2  PHOS 2.3  < > 1.5*  < > 1.8* 2.2* 1.8* 1.9* 2.0*  < > = values in this interval not displayed. Estimated Creatinine Clearance: 46.4 mL/min (by C-G formula based on Cr of 0.61).   LIVER  Recent Labs Lab 01/23/14 0235 01/25/14 0702  AST 20  --   ALT 18  --   ALKPHOS 59  --   BILITOT 0.6  --   PROT 4.6*  --   ALBUMIN 2.1*  --   INR  --  1.12     INFECTIOUS  Recent Labs Lab  01/22/14 0215  PROCALCITON 0.18     ENDOCRINE CBG (last 3)   Recent Labs  01/27/14 2008 01/27/14 2329 01/28/14 0424  GLUCAP 94 101* 119*      IMAGING x48h Dg Chest Port 1 View  01/27/2014   CLINICAL DATA:  81 year old female with acute respiratory failure, shortness of breath, fluid overload. Initial encounter.  EXAM: PORTABLE CHEST - 1 VIEW  COMPARISON:  01/25/2014 and earlier.  FINDINGS: Portable AP semi upright view at 0729 hr. Tracheostomy tube again projects in midline. There are then, 2 new tubes also projecting over midline and seem to course into the left upper quadrant.  Stable lung volumes. Mildly decreased pulmonary vascular congestion. Veiling basilar and retrocardiac opacity suggesting pleural effusions superimposed on lower lobe atelectasis. No pneumothorax.  IMPRESSION: 1. Stable tracheostomy tube. Query interval placement of 2 enteric tubes, also projecting over midline. 2. Regressed but not resolved pulmonary edema. Pleural effusions and lower lobe atelectasis suspected.   Electronically Signed   By: Lars Pinks M.D.   On: 01/27/2014 07:52   Dg Abd Portable 1v  01/27/2014   CLINICAL DATA:  Tube placement.  EXAM: PORTABLE ABDOMEN - 1 VIEW  COMPARISON:  01/26/2014.  FINDINGS: Feeding tube again noted projected over the distal stomach. Gas pattern is nonspecific. No free air identified. Cardiomegaly. Left lower lobe atelectasis and/or infiltrate.Left pleural effusion.  IMPRESSION: 1. Feeding tube in stable position. 2. Left lower lobe atelectasis and/or infiltrate with left pleural effusion. 3. Cardiomegaly.   Electronically Signed   By: Marcello Moores  Register   On: 01/27/2014 07:36    ASSESSMENT / PLAN:  PULMONARY OETT 12/30>01/21/14, 01/22/14 >> 01/25/14 trached 01/25/14 due to several reintubations 2/2 to airway edema/injury  A: BaselineHx of COPD NOS - AFL on spiro - follows with [facility]  ? Baseline CXR July 2015 with ?? ILD change but CT 01/20/14 showed onl;y PNA. No ILD Acute resp  failure - improving.  Upper airway trauma /edema  On trach collar now since 01/25/14. Tolerating fine.  P:   Cont steroid taper.   Cont trach collar Kept cuff and no PMV yet up as some secretions noted Cont neg balance with lasix Ambulation successful Replace phos  CARDIOVASCULAR A:  Was hypotensive, now normal. Baseline MAP likely low. BNP elevation - 06/2012 echo with  LVEF 60-65% Some interstitial edema on CXR.  P:  Diuresed very well with 60mg  lasix BID yesterday. Back to baseline weight today. CXR still shows effusion. Consider lower dose lasix today to maintain negative balance (20mg  BID today?)  RENAL A:   Severe low phos improving with repletion P:   Replete phos PRN. Gave 29mmol kphos IV today. Also on KCL 40mg  BID. Monitor UOP bmet in am   GASTROINTESTINAL A:   H/o GERD  P:   SUP: IV pepcid Cont TF through panda tube.  SLP recommended FEES before PO.  Avoid peg!! As progressing off vent  HEMATOLOGIC A:   dvt prevention Some leukocytosis - likely steroid induced.  P:  Heparin for VTE ppx Pt to ambulate  INFECTIOUS  A:   Leukocytosis still going on. Likely steroid induced now. CAP vs aspiration - PCT profile c/w localized infection. CT showed pneumonia   - admit trach aspirate with GPC in pairs repeat trach aspirate sent 01/21/14 due to tan color - normal flora   - currently afebrile. Was treated with abx with levaquin until 01/26/14.   P:   Abx: Ceftriaxone 12/30 >1231 Abx: Azithromycin 12/30 >12/312 IV levaquin 01/21/14 >> stopped 1/6.   No fevers noted Dc foley  ENDOCRINE A:   Hyperglycemia without history of DM, likely worsened by steroid.   Under good control now with SSI. P:   CBG and SSI Reduce steroids to off as goal  NEUROLOGIC A:  metabolic encephalopathy at night - resolved  P:   RASS goal: -1 to 0 wua Active PT / OT  FAMILY  - Updates: Niece (HCPOA) and friend updated ad at admit 01/19/2014. Patient updated 01/22/2014.  NEice  updated and patient 01/23/14   - Inter-disciplinary family meet or Palliative Care meeting due by: 01/25/2014  SUMMARY Continue steroid  Reduced, cont lasix to maintain neg balance, keep on trach collar. Step down pending.    Dellia Nims, M.D PGY-1 Internal Medicine Pager [phone]  STAFF NOTE: I, Merrie Roof, MD FACP have personally reviewed patient's available data, including medical history, events of note, physical examination and test results as part of my evaluation. I have discussed with resident/NP and other care providers such as pharmacist, RN and RRT. In addition, I personally evaluated patient and elicited key findings of: progressing well, continued TC, no pmv or cif down yet, needs objective testing swallow, lasix reduction, to tele, sats floor   Lavon Paganini. Titus Mould, MD, [place] Pgr: [place] [facility] 01/28/2014 11:05 AM

## 2014-01-28 NOTE — Progress Notes (Signed)
Speech Language Pathology Treatment: Dysphagia;[place] Speaking valve  Patient Details Name: Tanya Butler MRN: [id number] DOB: [birth year] Today's Date: 01/28/2014 Time: 4098-1191 SLP Time Calculation (min) (ACUTE ONLY): 26 min  Assessment / Plan / Recommendation Clinical Impression  Pt tolerated PMSV for ~25 minutes with increased vocal intensity and breath support from previous date. Cuff was deflated at baseline. Pt performed oral care via suction prior to SLP administered ice chip trials. Today, trials continue to result in wet vocal quality, immediate throat clearing, and increase in RR, although overall less overt difficulty than previous date. FEES could not be completed this afternoon due to equipment malfunction, therefore recommend proceeding with MBS on next date to determine least restrictive diet.  After ~25 minutes with PMSV donned, pt began to have an increase in RR with signs of breath stacking observed with SLP removal of valve. Pt expectorated a small amount of secretions from trach hub upon removal. Continue to recommend use of PMSV as tolerated throughout waking hours when full supervision can be provided.   HPI HPI: 81 year old female with past medical history as outlined below, which includes COPD (RB patient), hypertension, skin cancer, and GERD. She presented to [facility] emergency department 01/19/2014 with respiratory distress. Pt was intubated 12/30-1/1, 1/2-1/3, and again 1/3 until she received trach on 1/5. Pt currently with #6 cuffed [place] and initiated TCT 1/6.   Pertinent Vitals Pain Assessment: No/denies pain  SLP Plan  Continue with current plan of care;MBS    Recommendations Diet recommendations: NPO Medication Administration: Via alternative means      Patient may use Passy-Muir Speech Valve: During all waking hours (remove during sleep) PMSV Supervision: Full MD: Please consider changing trach tube to : Smaller size;Cuffless       Oral Care  Recommendations: Oral care Q4 per protocol Follow up Recommendations: Inpatient Rehab Plan: Continue with current plan of care;MBS    GO      [name], M.A. CCC-SLP [phone]  [name] 01/28/2014, 3:23 PM

## 2014-01-28 NOTE — Progress Notes (Signed)
Physical Therapy Treatment Patient Details Name: Tanya Butler MRN: [id number] DOB: [birth year] Today's Date: 01/28/2014    History of Present Illness 81 year old female with past medical history that includes COPD , hypertension, skin cancer, and GERD. She presented to [facility] emergency department 01/19/2014 with respiratory distress. Earlier that day she was in her attic cleaning was found by her family 15 minutes later, she was having trouble breathing and her lips were noted to be blue. ETT 12/30-1/1, 1/2-1/3 reintubated 1/3. Tracheostomy 1/5.    PT Comments    Pt progressing well and able to ambulate today with use of PMSV throughout session. Pt highly motivated to return to independent level and encouraged increased mobility with nursing throughout the day. Pt also educated for bil LE HEP to increase function and strength. Will continue to follow.   Follow Up Recommendations  CIR;Supervision/Assistance - 24 hour     Equipment Recommendations       Recommendations for Other Services       Precautions / Restrictions Precautions Precautions: Fall Precaution Comments: trach, panda Restrictions Weight Bearing Restrictions: No    Mobility  Bed Mobility Overal bed mobility: Needs Assistance Bed Mobility: Supine to Sit     Supine to sit: Min guard     General bed mobility comments: minguard for lines with cues for sequence  Transfers Overall transfer level: Needs assistance     Sit to Stand: Min assist         General transfer comment: cues for hand placement with limited assist to fully elevate from surface x 2 trials at bed and chair  Ambulation/Gait Ambulation/Gait assistance: Min assist;+2 safety/equipment (chair to follow) Ambulation Distance (Feet): 12 Feet (10', 12') Assistive device: Rolling walker (2 wheeled) Gait Pattern/deviations: Step-through pattern;Decreased stride length;Shuffle   Gait velocity interpretation: Below normal speed for  age/gender General Gait Details: cues for posture with fatigue limiting distance, pt bumped to 50% venturi for gait due to SOB but sats remained 92-95% throughout. Seated rest grossly 4 min between ambulation trials   Stairs            Wheelchair Mobility    Modified Rankin (Stroke Patients Only)       Balance Overall balance assessment: Needs assistance   Sitting balance-Leahy Scale: Good       Standing balance-Leahy Scale: Fair                      Cognition Arousal/Alertness: Awake/alert Behavior During Therapy: WFL for tasks assessed/performed Overall Cognitive Status: Within Functional Limits for tasks assessed                      Exercises      General Comments        Pertinent Vitals/Pain Pain Assessment: No/denies pain  HR 115-128 with brief jump to 138 with initial stand at bed    Home Living                      Prior Function            PT Goals (current goals can now be found in the care plan section) Progress towards PT goals: Progressing toward goals    Frequency       PT Plan Current plan remains appropriate    Co-evaluation             End of Session Equipment Utilized During Treatment: Gait belt;Oxygen Activity Tolerance: Patient tolerated treatment  well Patient left: in chair;with call bell/phone within reach     Time: 0755-0825 PT Time Calculation (min) (ACUTE ONLY): 30 min  Charges:  $Gait Training: 8-22 mins $Therapeutic Activity: 8-22 mins                    G Codes:      Melford Aase 2014-02-14, 10:18 AM Elwyn Reach, [place]

## 2014-01-28 NOTE — Progress Notes (Signed)
Patient transferred wheelchair and 40% trach VM to new room location after report was given to receiving RN, Rose.  Patient's meds, trach supplies, chart, and personal belongings all sent with patient.  Family present during transfer.  Safety measures maintained.  Doran Clay, RN

## 2014-01-29 DIAGNOSIS — I38 Endocarditis, valve unspecified: Secondary | ICD-10-CM

## 2014-01-29 LAB — BASIC METABOLIC PANEL
ANION GAP: 10 (ref 5–15)
BUN: 33 mg/dL — ABNORMAL HIGH (ref 6–23)
CHLORIDE: 98 meq/L (ref 96–112)
CO2: 31 mmol/L (ref 19–32)
CREATININE: 0.67 mg/dL (ref 0.50–1.10)
Calcium: 8.4 mg/dL (ref 8.4–10.5)
GFR calc Af Amer: >90 mL/min (ref >90)
GFR calc non Af Amer: 78 mL/min — ABNORMAL LOW (ref >90)
Glucose, Bld: 131 mg/dL — ABNORMAL HIGH (ref 70–99)
Potassium: 3.8 mmol/L (ref 3.5–5.1)
Sodium: 139 mmol/L (ref 135–145)

## 2014-01-29 LAB — GLUCOSE, CAPILLARY
GLUCOSE-CAPILLARY: 106 mg/dL — AB (ref 70–99)
GLUCOSE-CAPILLARY: 131 mg/dL — AB (ref 70–99)
GLUCOSE-CAPILLARY: 139 mg/dL — AB (ref 70–99)
GLUCOSE-CAPILLARY: 114 mg/dL — AB (ref 70–99)
Glucose-Capillary: 115 mg/dL — ABNORMAL HIGH (ref 70–99)
Glucose-Capillary: 117 mg/dL — ABNORMAL HIGH (ref 70–99)
Glucose-Capillary: 159 mg/dL — ABNORMAL HIGH (ref 70–99)

## 2014-01-29 NOTE — Progress Notes (Signed)
Patient moved to 2W11.  No change in current d/c plan.  Remains on 40% trach collar.  Needs to be on 28% trach collar for SNF placement.  CSW will continue to monitor.  Lorie Phenix. Pauline Good, [place]

## 2014-01-29 NOTE — Progress Notes (Signed)
PULMONARY / CRITICAL CARE MEDICINE   Name: Tanya Butler MRN: [id number] DOB: [birth year]    ADMISSION DATE:  01/19/2014 CONSULTATION DATE:  01/19/2014  REFERRING MD :  EDP  CHIEF COMPLAINT:  Respiratory distress  INITIAL PRESENTATION:  81 year old female with past medical history as outlined below, which includes COPD (RB patient), hypertension, skin cancer, and GERD. She presented to [facility] emergency department 01/19/2014 with respiratory distress.  SIGNIFICANT EVENTS: 01/19/2014 -admt 01/20/14 -  Patient  And family denied to RN that there was dust exposure. Patient went upto attic and just got very dyspneic. Failed SBT earlier. CT - with pneumonia. No ILD. RF  And all autoimmune- negative 01/21/14: Extubated. Repeattracheal aspirate sent - 01/22/2014: reintubated earlier due to agitation - seems to have recurent sun downinng. This AM was hypotensive - Rx neo aftter failing fluid bolus.  01/23/14: extubated but re intubated in the afternoon due to stridor  01/23/14 reintubated after developing stridor. Had swollen arytenoid. 01/24/14: No events.  01/25/14: trached. Reduced steroid to 60mg  BID. 1/6 and 1/7: remained on trach collar. Reduced steroid to 20 daily.  SUBJECTIVE/OVERNIGHT/INTERVAL HX 01/29/2014: No events overnight. Remained on trach collar doing well. Diuresed very well with lasix.   VITAL SIGNS: Temp:  [98.2 F (36.8 C)-99.6 F (37.6 C)] 98.5 F (36.9 C) (01/08 1931) Pulse Rate:  [105-121] 105 (01/09 0338) Resp:  [21-39] 24 (01/09 0338) BP: (88-110)/(47-66) 103/59 mmHg (01/08 1931) SpO2:  [92 %-97 %] 94 % (01/09 0735) FiO2 (%):  [35 %-40 %] 35 % (01/09 0735) HEMODYNAMICS:   VENTILATOR SETTINGS: Vent Mode:  [-]  FiO2 (%):  [35 %-40 %] 35 % INTAKE / OUTPUT:  Intake/Output Summary (Last 24 hours) at 01/29/14 1004 Last data filed at 01/29/14 0900  Gross per 24 hour  Intake 1241.33 ml  Output   1300 ml  Net -58.67 ml    PHYSICAL EXAMINATION: General:  Elderly  female on trach collar, and panda tube , calm, cooperative, lying in bed Neuro:  RASS 0, CAM-ICU negative for delirium. Moves all 4s HEENT:  [place]/AT, trach in place, normal trach site, No JVD noted Cardiovascular:  RRR, systolic loud murmur LUSB Lungs:  Coarse bilateral breath sounds. No wheeze. Upper airway rattle Abdomen:  Soft, non-tender, non-distended Musculoskeletal:  No acute deformity or edema.  Skin:  Grossly intact  LABS: PULMONARY No results for input(s): PHART, PCO2ART, PO2ART, HCO3, TCO2, O2SAT in the last 168 hours.  Invalid input(s): PCO2, PO2  CBC  Recent Labs Lab 01/24/14 0838 01/25/14 0702 01/27/14 0915  HGB 11.6* 10.2* 12.1  HCT 33.7* 31.8* 36.9  WBC 11.0* 9.1 12.2*  PLT 220 184 263    COAGULATION  Recent Labs Lab 01/25/14 0702  INR 1.12    CARDIAC   No results for input(s): TROPONINI in the last 168 hours. No results for input(s): PROBNP in the last 168 hours.   CHEMISTRY  Recent Labs Lab 01/23/14 0600  01/24/14 0838 01/24/14 1837 01/25/14 0702 01/27/14 0305 01/28/14 0327 01/29/14 0329  NA 135  --   --   --  139 144 142 139  K 3.7  --   --   --  3.9 3.8 3.6 3.8  CL 110  --   --   --  112 112 103 98  CO2 24  --   --   --  23 28 34* 31  GLUCOSE 113*  --   --   --  169* 114* 126* 131*  BUN 15  --   --   --  26* 24* 22 33*  CREATININE 0.64  --   --   --  0.60 0.55 0.61 0.67  CALCIUM 7.2*  --   --   --  7.3* 7.9* 8.2* 8.4  MG 2.2  < > 2.1 2.2 2.4 2.3 2.2  --   PHOS 1.5*  < > 1.8* 2.2* 1.8* 1.9* 2.0*  --   < > = values in this interval not displayed. Estimated Creatinine Clearance: 46.4 mL/min (by C-G formula based on Cr of 0.67).   LIVER  Recent Labs Lab 01/23/14 0235 01/25/14 0702  AST 20  --   ALT 18  --   ALKPHOS 59  --   BILITOT 0.6  --   PROT 4.6*  --   ALBUMIN 2.1*  --   INR  --  1.12     INFECTIOUS No results for input(s): LATICACIDVEN, PROCALCITON in the last 168 hours.   ENDOCRINE CBG (last 3)   Recent  Labs  01/29/14 0005 01/29/14 0207 01/29/14 0412  GLUCAP 117* 106* 131*      IMAGING x48h Dg Chest Port 1 View  01/28/2014   CLINICAL DATA:  Shortness of breath  EXAM: PORTABLE CHEST - 1 VIEW  COMPARISON:  01/27/2014  FINDINGS: Cardiac shadow remains enlarged. A feeding catheter and tracheostomy tube are again seen and stable. Small bilateral pleural effusions are noted right greater than left with mild basilar atelectasis. These have increased slightly in the interval from the prior exam.  IMPRESSION: Bilateral pleural effusions and basilar atelectasis. The overall appearance has worsened slightly in the right base when compare with the prior exam.   Electronically Signed   By: Inez Catalina M.D.   On: 01/28/2014 07:26    ASSESSMENT / PLAN:  PULMONARY OETT 12/30>01/21/14, 01/22/14 >> 01/25/14 trached 01/25/14 due to several reintubations 2/2 to airway edema/injury  A: BaselineHx of COPD NOS - AFL on spiro - follows with [facility]  ? Baseline CXR July 2015 with ?? ILD change but CT 01/20/14 showed onl;y PNA. No ILD Acute resp failure - improving.  Upper airway trauma /edema  On trach collar now since 01/25/14. Tolerating fine.  P:   Cont steroid taper.   Cont trach collar Kept cuff and no PMV yet up as some secretions noted Cont neg balance with lasix Ambulation successful -consider PMV  CARDIOVASCULAR A:  Was hypotensive, now normal. Baseline MAP likely low. BNP elevation - 06/2012 echo with LVEF 60-65% Some interstitial edema on CXR.  P:  Diuresed very well with 60mg  lasix BID yesterday. Back to baseline weight today. CXR still shows effusion. Consider lower dose lasix today to maintain negative balance (20mg  BID today?)  RENAL A:   Severe low phos improving with repletion P:   Replete phos PRN. Gave 83mmol kphos IV today. Also on KCL 40mg  BID. Monitor UOP cmet in am   GASTROINTESTINAL A:   H/o GERD  P:   SUP: IV pepcid Cont TF through panda tube.  SLP recommended FEES  before PO. Awaiting. Avoid peg!! As progressing off vent  HEMATOLOGIC A:   dvt prevention Some leukocytosis - likely steroid induced.  P:  Heparin for VTE ppx Pt to ambulate  INFECTIOUS  A:   Leukocytosis still going on. Likely steroid induced now. CAP vs aspiration - PCT profile c/w localized infection. CT showed pneumonia   - admit trach aspirate with GPC in pairs repeat trach aspirate sent 01/21/14 due to tan color - normal flora   - currently afebrile. Was  treated with abx with levaquin until 01/26/14.   P:   Abx: Ceftriaxone 12/30 >1231 Abx: Azithromycin 12/30 >12/312 IV levaquin 01/21/14 >> stopped 1/6.   No fevers noted Dc foley  ENDOCRINE A:   Hyperglycemia without history of DM, likely worsened by steroid.   Under good control now with SSI. P:   CBG and SSI Reduce steroids to off as goal  NEUROLOGIC A:  metabolic encephalopathy at night - resolved  P:   RASS goal: -1 to 0 wua Active PT / OT  FAMILY  - Updates: Niece (HCPOA) and friend updated ad at admit 01/19/2014. Patient updated 01/22/2014.  NEice updated and patient 01/23/14   SUMMARY Awaiting FESS Update CXR tomorrow. Need to decide when to try extubation next week.   [place], [place]   m [phone] [facility] 01/29/2014 10:04 AM

## 2014-01-29 NOTE — Progress Notes (Signed)
Upon review of chart, given on pt demonstrated wet vocal quality, increased RR and immediate throat clearing with ice/  chip trials and is nutritionally supported,  recommend defer MBS at this time.  Will follow for MBS readiness with PMSV in place.  Spoke to RN [name] today to relay information.  Thanks.  Luanna Salk, [place] [facility] SLP  [phone]

## 2014-01-30 ENCOUNTER — Inpatient Hospital Stay (HOSPITAL_COMMUNITY): Payer: Medicare Other

## 2014-01-30 DIAGNOSIS — J9601 Acute respiratory failure with hypoxia: Secondary | ICD-10-CM

## 2014-01-30 DIAGNOSIS — I4891 Unspecified atrial fibrillation: Secondary | ICD-10-CM

## 2014-01-30 LAB — GLUCOSE, CAPILLARY
GLUCOSE-CAPILLARY: 105 mg/dL — AB (ref 70–99)
GLUCOSE-CAPILLARY: 81 mg/dL (ref 70–99)
Glucose-Capillary: 104 mg/dL — ABNORMAL HIGH (ref 70–99)
Glucose-Capillary: 120 mg/dL — ABNORMAL HIGH (ref 70–99)
Glucose-Capillary: 154 mg/dL — ABNORMAL HIGH (ref 70–99)
Glucose-Capillary: 133 mg/dL — ABNORMAL HIGH (ref 70–99)

## 2014-01-30 LAB — BLOOD GAS, ARTERIAL
ACID-BASE EXCESS: 12.2 mmol/L — AB (ref 0.0–2.0)
BICARBONATE: 36.9 meq/L — AB (ref 20.0–24.0)
Drawn by: 39899
FIO2: 1 %
O2 Saturation: 99.9 %
PEEP: 5 cmH2O
PO2 ART: 291 mmHg — AB (ref 80.0–100.0)
Patient temperature: 98.6
RATE: 18 resp/min
TCO2: 38.5 mmol/L (ref 0–100)
VT: 460 mL
pCO2 arterial: 53.6 mmHg — ABNORMAL HIGH (ref 35.0–45.0)
pH, Arterial: 7.452 — ABNORMAL HIGH (ref 7.350–7.450)

## 2014-01-30 LAB — COMPREHENSIVE METABOLIC PANEL
ALBUMIN: 2.5 g/dL — AB (ref 3.5–5.2)
ALT: 30 U/L (ref 0–35)
AST: 26 U/L (ref 0–37)
Alkaline Phosphatase: 75 U/L (ref 39–117)
Anion gap: 3 — ABNORMAL LOW (ref 5–15)
BUN: 30 mg/dL — ABNORMAL HIGH (ref 6–23)
CHLORIDE: 100 meq/L (ref 96–112)
CO2: 39 mmol/L — ABNORMAL HIGH (ref 19–32)
Calcium: 9.3 mg/dL (ref 8.4–10.5)
Creatinine, Ser: 0.72 mg/dL (ref 0.50–1.10)
GFR calc Af Amer: 88 mL/min — ABNORMAL LOW (ref >90)
GFR, EST NON AFRICAN AMERICAN: 76 mL/min — AB (ref >90)
GLUCOSE: 110 mg/dL — AB (ref 70–99)
Potassium: 3.9 mmol/L (ref 3.5–5.1)
SODIUM: 142 mmol/L (ref 135–145)
Total Bilirubin: 0.7 mg/dL (ref 0.3–1.2)
Total Protein: 5 g/dL — ABNORMAL LOW (ref 6.0–8.3)

## 2014-01-30 LAB — TROPONIN I
Troponin I: 3.5 ng/mL (ref <0.031)
Troponin I: 3.13 ng/mL (ref <0.031)

## 2014-01-30 LAB — MRSA PCR SCREENING: MRSA BY PCR: NEGATIVE

## 2014-01-30 MED ORDER — FENTANYL CITRATE 0.05 MG/ML IJ SOLN
50.0000 ug | INTRAMUSCULAR | Status: DC | PRN
  Administered 2014-01-30: 50 ug via INTRAVENOUS
  Filled 2014-01-30: qty 2

## 2014-01-30 MED ORDER — MIDAZOLAM HCL 2 MG/2ML IJ SOLN
INTRAMUSCULAR | Status: AC
  Filled 2014-01-30: qty 4

## 2014-01-30 MED ORDER — WHITE PETROLATUM GEL
Status: AC
  Administered 2014-01-30: 0.2
  Filled 2014-01-30: qty 1

## 2014-01-30 MED ORDER — ETOMIDATE 2 MG/ML IV SOLN
INTRAVENOUS | Status: AC
  Filled 2014-01-30: qty 10

## 2014-01-30 MED ORDER — SODIUM CHLORIDE 0.9 % IV BOLUS (SEPSIS): 750.0000 mL | Freq: Once | INTRAVENOUS | Status: DC

## 2014-01-30 MED ORDER — FENTANYL CITRATE 0.05 MG/ML IJ SOLN
INTRAMUSCULAR | Status: AC
  Filled 2014-01-30: qty 4

## 2014-01-30 MED ORDER — VITAMINS A & D EX OINT
TOPICAL_OINTMENT | CUTANEOUS | Status: DC | PRN
  Filled 2014-01-30: qty 5

## 2014-01-30 MED ORDER — CETYLPYRIDINIUM CHLORIDE 0.05 % MT LIQD
7.0000 mL | Freq: Four times a day (QID) | OROMUCOSAL | Status: DC
  Administered 2014-01-30 – 2014-02-03 (×17): 7 mL via OROMUCOSAL

## 2014-01-30 MED ORDER — ETOMIDATE 2 MG/ML IV SOLN
20.0000 mg | Freq: Once | INTRAVENOUS | Status: AC
  Administered 2014-01-30: 20 mg via INTRAVENOUS

## 2014-01-30 MED ORDER — FENTANYL CITRATE 0.05 MG/ML IJ SOLN: 50.0000 ug | INTRAMUSCULAR | Status: DC | PRN

## 2014-01-30 MED ORDER — FENTANYL CITRATE 0.05 MG/ML IJ SOLN
100.0000 ug | Freq: Once | INTRAMUSCULAR | Status: AC
  Administered 2014-01-30: 100 ug via INTRAVENOUS

## 2014-01-30 MED ORDER — MIDAZOLAM HCL 2 MG/2ML IJ SOLN
1.0000 mg | Freq: Once | INTRAMUSCULAR | Status: AC
  Administered 2014-01-30: 1 mg via INTRAVENOUS

## 2014-01-30 MED ORDER — CHLORHEXIDINE GLUCONATE 0.12 % MT SOLN
15.0000 mL | Freq: Two times a day (BID) | OROMUCOSAL | Status: DC
  Administered 2014-01-30 – 2014-02-03 (×8): 15 mL via OROMUCOSAL
  Filled 2014-01-30 (×8): qty 15

## 2014-01-30 NOTE — Progress Notes (Signed)
CRITICAL VALUE ALERT  Critical value received:  3.5  Date of notification:  01/30/13  Time of notification:  2010  Critical value read back:Yes.    Nurse who received alert:  Polly Cobia  MD notified (1st page):  [place]  Time of first page:  2015  MD notified (2nd page):  Time of second page:  Responding MD:  [place]  Time MD responded:  2015

## 2014-01-30 NOTE — Progress Notes (Signed)
Pt found with TF off and panda unable to flush. MD notified and orders to replace panda tube were received and to resume TF. Critical troponin reported to MD and stat EKG obtained. EKG showed ST and no other abnormalities. No new orders received.

## 2014-01-30 NOTE — Procedures (Signed)
Pt placed back on vent.  Pre-sedated w/ versed, fentanyl and etomidate Fluid challenge infusing Initial pre cardioversion HR 200s, sbo 70s Synchronized cardioversion at 100J w/ successful return to NSR/ST.  SBP 110  Plan Cont IVFs Hold lasix today Rest on vent, ok to trial PSV  Erick Colace ACNP-BC [place] Pager # [phone] OR # [phone] if no answer  I was present for and supervised the entire procedure  Merton Border, MD ; [facility] service Mobile [phone].  After 5:30 PM or weekends, call [phone]

## 2014-01-30 NOTE — Progress Notes (Signed)
Called for a Rapid Response: Pt bagged over to Medical ICU for cardioversion. Pt remained stable during transport.

## 2014-01-30 NOTE — Progress Notes (Signed)
CCMD alerted RN to rapid HR ranging from 170s-200s. Upon assessment of pt, BP 70/42, diaphoretic and complaints of chest pain and some SOB. Assisted pt back to bed. MD paged of condition and returned call. Asked to repage with results of EKG.  Tracheal suction performed with successful removal of thick secretions.  Tube feeds placed on hold and EKG obtained. MD paged with results of EKG and condition. Orders received to transfer patient to ICU once bed becomes available. Fluid bolus and assisted ventilations via Ambubag started per NP. Fluid brought SBP up in the 80s-90s. Pt transferred to ICU with RT providing assisted ventilations with belongings. Report given to receiving RN. HCPOA updated on patient's condition and new location.

## 2014-01-30 NOTE — Progress Notes (Signed)
SLP Cancellation Note  Patient Details Name: Tanya Butler MRN: [id number] DOB: 1926-03-19   Cancelled treatment:       Reason Eval/Treat Not Completed: Medical issues which prohibited therapy (Patient back on vent. Will f/u. )  Gabriel Rainwater MA, CCC-SLP [phone]  [name] Meryl 01/30/2014, 1:47 PM

## 2014-01-30 NOTE — Progress Notes (Signed)
PULMONARY / CRITICAL CARE MEDICINE   Name: Tanya Butler MRN: [id number] DOB: [birth year]    ADMISSION DATE:  01/19/2014 CONSULTATION DATE:  01/19/2014  REFERRING MD :  EDP  CHIEF COMPLAINT:  Respiratory distress  INITIAL PRESENTATION:  81 year old female with past medical history as outlined below, which includes COPD (RB patient), hypertension, skin cancer, and GERD. She presented to [facility] emergency department 01/19/2014 with respiratory distress.  SIGNIFICANT EVENTS: 01/19/2014 -admt 01/20/14 -  Patient  And family denied to RN that there was dust exposure. Patient went upto attic and just got very dyspneic. Failed SBT earlier. CT - with pneumonia. No ILD. RF  And all autoimmune- negative 01/21/14: Extubated. Repeattracheal aspirate sent - 01/22/2014: reintubated earlier due to agitation - seems to have recurent sun downinng. This AM was hypotensive - Rx neo aftter failing fluid bolus.  01/23/14: extubated but re intubated in the afternoon due to stridor  01/23/14 reintubated after developing stridor. Had swollen arytenoid. 01/24/14: No events.  01/25/14: trached. Reduced steroid to 60mg  BID. 1/6 and 1/7: remained on trach collar. Reduced steroid to 20 daily.  SUBJECTIVE/OVERNIGHT/INTERVAL HX 01/30/2014: No events overnight. Remains on trach collar doing well. Social Worker goal for SNF placement is FiO2 28%.  VITAL SIGNS: Temp:  [98.2 F (36.8 C)-99.2 F (37.3 C)] 99.2 F (37.3 C) (01/10 0428) Pulse Rate:  [96-120] 99 (01/10 0428) Resp:  [20-36] 20 (01/10 0417) BP: (99-130)/(48-90) 130/67 mmHg (01/10 0428) SpO2:  [93 %-98 %] 96 % (01/10 0810) FiO2 (%):  [35 %-40 %] 40 % (01/10 0810) Weight:  [63.6 kg (140 lb 3.4 oz)-66.271 kg (146 lb 1.6 oz)] 63.6 kg (140 lb 3.4 oz) (01/10 0428) HEMODYNAMICS:   VENTILATOR SETTINGS: Vent Mode:  [-]  FiO2 (%):  [35 %-40 %] 40 % INTAKE / OUTPUT:  Intake/Output Summary (Last 24 hours) at 01/30/14 0856 Last data filed at 01/30/14 0430   Gross per 24 hour  Intake     60 ml  Output   2000 ml  Net  -1940 ml    PHYSICAL EXAMINATION: General:  Elderly female on trach collar, and panda tube , calm, cooperative, lying in bed Neuro:  RASS 0, CAM-ICU negative for delirium. Moves all 4s, interactive HEENT:  [place]/AT, trach in place, normal trach site, No JVD noted Cardiovascular:  RRR, systolic loud murmur LUSB Lungs:  Coarse bilateral breath sounds. No wheeze. Upper airway rattle Abdomen:  Soft, non-tender, non-distended Musculoskeletal:  No acute deformity or edema.  Skin:  Grossly intact  LABS: PULMONARY No results for input(s): PHART, PCO2ART, PO2ART, HCO3, TCO2, O2SAT in the last 168 hours.  Invalid input(s): PCO2, PO2  CBC  Recent Labs Lab 01/24/14 0838 01/25/14 0702 01/27/14 0915  HGB 11.6* 10.2* 12.1  HCT 33.7* 31.8* 36.9  WBC 11.0* 9.1 12.2*  PLT 220 184 263    COAGULATION  Recent Labs Lab 01/25/14 0702  INR 1.12    CARDIAC   No results for input(s): TROPONINI in the last 168 hours. No results for input(s): PROBNP in the last 168 hours.   CHEMISTRY  Recent Labs Lab 01/24/14 2202 01/24/14 1837  01/25/14 0702 01/27/14 0305 01/28/14 0327 01/29/14 0329 01/30/14 0323  NA  --   --   --  139 144 142 139 142  K  --   --   < > 3.9 3.8 3.6 3.8 3.9  CL  --   --   --  112 112 103 98 100  CO2  --   --   --  23 28 34* 31 39*  GLUCOSE  --   --   --  169* 114* 126* 131* 110*  BUN  --   --   --  26* 24* 22 33* 30*  CREATININE  --   --   --  0.60 0.55 0.61 0.67 0.72  CALCIUM  --   --   --  7.3* 7.9* 8.2* 8.4 9.3  MG 2.1 2.2  --  2.4 2.3 2.2  --   --   PHOS 1.8* 2.2*  --  1.8* 1.9* 2.0*  --   --   < > = values in this interval not displayed. Estimated Creatinine Clearance: 45 mL/min (by C-G formula based on Cr of 0.72).   LIVER  Recent Labs Lab 01/25/14 0702 01/30/14 0323  AST  --  26  ALT  --  30  ALKPHOS  --  75  BILITOT  --  0.7  PROT  --  5.0*  ALBUMIN  --  2.5*  INR 1.12  --       INFECTIOUS No results for input(s): LATICACIDVEN, PROCALCITON in the last 168 hours.   ENDOCRINE CBG (last 3)   Recent Labs  01/29/14 2024 01/29/14 2328 01/30/14 0427  GLUCAP 114* 104* 105*      IMAGING x48h Dg Chest 2 View  01/30/2014   CLINICAL DATA:  COPD exacerbation  EXAM: CHEST  2 VIEW  COMPARISON:  01/28/2014  FINDINGS: Cardiomegaly with pulmonary vascular congestion. Possible mild right perihilar edema.  Mild patchy bilateral lower lobe opacities, likely atelectasis. No pneumothorax.  Tracheostomy in satisfactory position.  Enteric tube coursing into the stomach.  IMPRESSION: Cardiomegaly with pulmonary vascular congestion and possible mild right perihilar edema.  Patchy bilateral lower lobe opacities, likely atelectasis.   Electronically Signed   By: Julian Hy M.D.   On: 01/30/2014 07:30    ASSESSMENT / PLAN:  PULMONARY OETT 12/30>01/21/14, 01/22/14 >> 01/25/14 trached 01/25/14 due to several reintubations 2/2 to airway edema/injury  A: BaselineHx of COPD NOS - AFL on spiro - follows with [facility]  ? Baseline CXR July 2015 with ?? ILD change but CT 01/20/14 showed onl;y PNA. No ILD Acute resp failure - improving.  Upper airway trauma /edema  On trach collar now since 01/25/14. Tolerating fine.   P:   Cont steroid taper.   Cont trach collar Kept cuff and no PMV yet up as some secretions noted Cont neg balance with lasix Ambulation successful -consider PMV - Set FiO2 at 28% to see if that holds her well enough/  CARDIOVASCULAR A:  Was hypotensive, now normal. Baseline MAP likely low. BNP elevation - 06/2012 echo with LVEF 60-65% Some interstitial edema on CXR.  P:  Diuresed very well with 60mg  lasix BID yesterday. Back to baseline weight today. CXR still shows effusion. Consider lower dose lasix today to maintain negative balance (20mg  BID today?) Need to continue diuresing- CXR  looks wet  RENAL A:   Severe low phos improving with repletion P:    Replete phos PRN. Gave 22mmol kphos IV today. Also on KCL 40mg  BID. Monitor UOP cmet in am    GASTROINTESTINAL A:   H/o GERD  P:   SUP: IV pepcid Cont TF through panda tube.  SLP recommended FEES before PO. Awaiting. Avoid peg!! As progressing off vent  HEMATOLOGIC A:   dvt prevention Some leukocytosis - likely steroid induced.  P:  Heparin for VTE ppx Pt to ambulate  INFECTIOUS  A:   Leukocytosis  still going on. Likely steroid induced now. CAP vs aspiration - PCT profile c/w localized infection. CT showed pneumonia   - admit trach aspirate with GPC in pairs repeat trach aspirate sent 01/21/14 due to tan color - normal flora   - currently afebrile. Was treated with abx with levaquin until 01/26/14.   P:   Abx: Ceftriaxone 12/30 >1231 Abx: Azithromycin 12/30 >12/312 IV levaquin 01/21/14 >> stopped 1/6.   No fevers noted Dc foley  ENDOCRINE A:   Hyperglycemia without history of DM, likely worsened by steroid.   Under good control now with SSI. P:   CBG and SSI Reduce steroids to off as goal  NEUROLOGIC A:  metabolic encephalopathy at night - resolved  P:   RASS goal: -1 to 0 wua Active PT / OT  FAMILY  - Updates: Niece (HCPOA) and friend updated ad at admit 01/19/2014. Patient updated 01/22/2014.  NEice updated and patient 01/23/14   SUMMARY Awaiting FESS Trying 28% O2= goal for SNF. Consider downsize trach or [place], MD PCCM p [phone]   m [phone] [facility] 01/30/2014 8:56 AM

## 2014-01-30 NOTE — Progress Notes (Signed)
PULMONARY / CRITICAL CARE MEDICINE   Name: Tanya Butler MRN: [id number] DOB: 11-28-1926    ADMISSION DATE:  01/19/2014 CONSULTATION DATE:  01/19/2014  REFERRING MD :  EDP  CHIEF COMPLAINT:  Respiratory distress  INITIAL PRESENTATION:  81 year old female with past medical history as outlined below, which includes COPD (RB patient), hypertension, skin cancer, and GERD. She presented to [facility] emergency department 01/19/2014 with respiratory distress.  SIGNIFICANT EVENTS: 01/19/2014 -admt 01/20/14 -  Patient  And family denied to RN that there was dust exposure. Patient went upto attic and just got very dyspneic. Failed SBT earlier. CT - with pneumonia. No ILD. RF  And all autoimmune- negative 01/21/14: Extubated. Repeattracheal aspirate sent - 01/22/2014: reintubated earlier due to agitation - seems to have recurent sun downinng. This AM was hypotensive - Rx neo aftter failing fluid bolus.  01/23/14: extubated but re intubated in the afternoon due to stridor  01/23/14 reintubated after developing stridor. Had swollen arytenoid. 01/24/14: No events.  01/25/14: trached. Reduced steroid to 60mg  BID. 1/6 and 1/7: remained on trach collar. Reduced steroid to 20 daily. 1/10: now in AF w/ RVR HR 200s. Moved back to ICU, Placed back on vent. Cardioverted   SUBJECTIVE/OVERNIGHT/INTERVAL HX 01/30/2014: Now in shock w/ CP   VITAL SIGNS: Temp:  [98.2 F (36.8 C)-99.2 F (37.3 C)] 99.2 F (37.3 C) (01/10 0428) Pulse Rate:  [96-120] 99 (01/10 0428) Resp:  [20-36] 20 (01/10 0417) BP: (99-130)/(48-90) 130/67 mmHg (01/10 0428) SpO2:  [93 %-98 %] 96 % (01/10 0810) FiO2 (%):  [35 %-40 %] 40 % (01/10 0810) Weight:  [63.6 kg (140 lb 3.4 oz)-66.271 kg (146 lb 1.6 oz)] 63.6 kg (140 lb 3.4 oz) (01/10 0428) HEMODYNAMICS:   VENTILATOR SETTINGS: Vent Mode:  [-]  FiO2 (%):  [35 %-40 %] 40 % INTAKE / OUTPUT:  Intake/Output Summary (Last 24 hours) at 01/30/14 1056 Last data filed at 01/30/14 0430   Gross per 24 hour  Intake      0 ml  Output   2000 ml  Net  -2000 ml    PHYSICAL EXAMINATION: General:  Elderly female on trach collar, and panda tube , calm, cooperative, lying in bed, anxious  Neuro:  RASS 0, CAM-ICU negative for delirium. Moves all 4s, interactive HEENT:  [place]/AT, trach in place, normal trach site, No JVD noted Cardiovascular:  RRR, systolic loud murmur LUSB Lungs:  Coarse bilateral breath + accessory muscle use  Abdomen:  Soft, non-tender, non-distended Musculoskeletal:  No acute deformity or edema.  Skin:  Grossly intact  LABS: PULMONARY No results for input(s): PHART, PCO2ART, PO2ART, HCO3, TCO2, O2SAT in the last 168 hours.  Invalid input(s): PCO2, PO2  CBC  Recent Labs Lab 01/24/14 0838 01/25/14 0702 01/27/14 0915  HGB 11.6* 10.2* 12.1  HCT 33.7* 31.8* 36.9  WBC 11.0* 9.1 12.2*  PLT 220 184 263    COAGULATION  Recent Labs Lab 01/25/14 0702  INR 1.12    CARDIAC   No results for input(s): TROPONINI in the last 168 hours. No results for input(s): PROBNP in the last 168 hours.   CHEMISTRY  Recent Labs Lab 01/24/14 2725 01/24/14 1837  01/25/14 0702 01/27/14 0305 01/28/14 0327 01/29/14 0329 01/30/14 0323  NA  --   --   --  139 144 142 139 142  K  --   --   < > 3.9 3.8 3.6 3.8 3.9  CL  --   --   --  112 112 103  98 100  CO2  --   --   --  23 28 34* 31 39*  GLUCOSE  --   --   --  169* 114* 126* 131* 110*  BUN  --   --   --  26* 24* 22 33* 30*  CREATININE  --   --   --  0.60 0.55 0.61 0.67 0.72  CALCIUM  --   --   --  7.3* 7.9* 8.2* 8.4 9.3  MG 2.1 2.2  --  2.4 2.3 2.2  --   --   PHOS 1.8* 2.2*  --  1.8* 1.9* 2.0*  --   --   < > = values in this interval not displayed. Estimated Creatinine Clearance: 45 mL/min (by C-G formula based on Cr of 0.72).   LIVER  Recent Labs Lab 01/25/14 0702 01/30/14 0323  AST  --  26  ALT  --  30  ALKPHOS  --  75  BILITOT  --  0.7  PROT  --  5.0*  ALBUMIN  --  2.5*  INR 1.12  --       INFECTIOUS No results for input(s): LATICACIDVEN, PROCALCITON in the last 168 hours.   ENDOCRINE CBG (last 3)   Recent Labs  01/29/14 2328 01/30/14 0427 01/30/14 0912  GLUCAP 104* 105* 120*      IMAGING x48h Dg Chest 2 View  01/30/2014   CLINICAL DATA:  COPD exacerbation  EXAM: CHEST  2 VIEW  COMPARISON:  01/28/2014  FINDINGS: Cardiomegaly with pulmonary vascular congestion. Possible mild right perihilar edema.  Mild patchy bilateral lower lobe opacities, likely atelectasis. No pneumothorax.  Tracheostomy in satisfactory position.  Enteric tube coursing into the stomach.  IMPRESSION: Cardiomegaly with pulmonary vascular congestion and possible mild right perihilar edema.  Patchy bilateral lower lobe opacities, likely atelectasis.   Electronically Signed   By: Julian Hy M.D.   On: 01/30/2014 07:30    ASSESSMENT / PLAN:  PULMONARY OETT 12/30>01/21/14, 01/22/14 >> 01/25/14 trached 01/25/14 due to several reintubations 2/2 to airway edema/injury  A: BaselineHx of COPD NOS - AFL on spiro - follows with [facility]  ? Baseline CXR July 2015 with ?? ILD change but CT 01/20/14 showed onl;y PNA. No ILD Acute resp failure - improving.  Upper airway trauma /edema  On trach collar now since 01/25/14. Back on vent for cardioversion 1/10   P:   Resume full vent support for now, may cycle later if cardioversion successful  Cont steroid taper.   PAD protocol   CARDIOVASCULAR A:  Was hypotensive, now normal. Baseline MAP likely low. BNP elevation - 06/2012 echo with LVEF 60-65% AF w/ RVR and shock P:  Move to ICU Back on vent  Fluid challenge Cardiovert Cycle CEs  RENAL A:   Severe low phos improving with repletion P:   Replete phos PRN. Gave 66mmol kphos IV today. Also on KCL 40mg  BID. Monitor UOP cmet in am    GASTROINTESTINAL A:   H/o GERD  P:   SUP: IV pepcid Cont TF through panda tube.  SLP recommended FEES before PO. Awaiting. Avoid peg!! As progressing off  vent  HEMATOLOGIC A:   dvt prevention Some leukocytosis - likely steroid induced.  P:  Heparin for VTE ppx Pt to ambulate  INFECTIOUS  A:   Leukocytosis still going on. Likely steroid induced now. CAP vs aspiration - PCT profile c/w localized infection. CT showed pneumonia  - admit trach aspirate with GPC in pairs repeat  trach aspirate sent 01/21/14 due to tan color - normal flora  - currently afebrile. Was treated with abx with levaquin until 01/26/14.   P:   Abx: Ceftriaxone 12/30 >1231 Abx: Azithromycin 12/30 >12/312 IV levaquin 01/21/14 >> stopped 1/6.   No fevers noted Dc foley  ENDOCRINE  A: Hyperglycemia  P:   CBG and SSI Reduce steroids to off as goal  NEUROLOGIC A:  metabolic encephalopathy at night - resolved  P:   RASS goal: -1 to 0 wua Active PT / OT  FAMILY  - Updates: Niece (HCPOA) and friend updated ad at admit 01/19/2014. Patient updated 01/22/2014.  NEice updated and patient 01/23/14   NP SUMMARY Was improving. Had been attempting to wean O2. Developed progressive dyspnea and then AF w/ RVR. Now in shock. Placing her back on the vent, getting fluid challenge. Will cardiovert. Suspect that this was respiratory induced.   Erick Colace ACNP-BC [place] Pager # [phone] OR # [phone] if no answer   CCM ATTENDING  PCCM ATTENDING: I have reviewed pt's initial presentation, consultants notes and hospital database in detail.  The above assessment and plan was formulated under my direction.  Merton Border, MD;  PCCM service; Mobile [phone]

## 2014-01-31 ENCOUNTER — Inpatient Hospital Stay (HOSPITAL_COMMUNITY): Payer: Medicare Other

## 2014-01-31 DIAGNOSIS — I480 Paroxysmal atrial fibrillation: Secondary | ICD-10-CM

## 2014-01-31 DIAGNOSIS — I4891 Unspecified atrial fibrillation: Secondary | ICD-10-CM | POA: Diagnosis not present

## 2014-01-31 LAB — CBC
HCT: 33.4 % — ABNORMAL LOW (ref 36.0–46.0)
Hemoglobin: 10.7 g/dL — ABNORMAL LOW (ref 12.0–15.0)
MCH: 30.3 pg (ref 26.0–34.0)
MCHC: 32 g/dL (ref 30.0–36.0)
MCV: 94.6 fL (ref 78.0–100.0)
Platelets: 237 10*3/uL (ref 150–400)
RBC: 3.53 MIL/uL — ABNORMAL LOW (ref 3.87–5.11)
RDW: 15.7 % — ABNORMAL HIGH (ref 11.5–15.5)
WBC: 14.2 10*3/uL — ABNORMAL HIGH (ref 4.0–10.5)

## 2014-01-31 LAB — COMPREHENSIVE METABOLIC PANEL
ALK PHOS: 68 U/L (ref 39–117)
ALT: 36 U/L — ABNORMAL HIGH (ref 0–35)
ANION GAP: 7 (ref 5–15)
AST: 40 U/L — ABNORMAL HIGH (ref 0–37)
Albumin: 2.7 g/dL — ABNORMAL LOW (ref 3.5–5.2)
BUN: 35 mg/dL — ABNORMAL HIGH (ref 6–23)
CALCIUM: 9 mg/dL (ref 8.4–10.5)
CO2: 31 mmol/L (ref 19–32)
Chloride: 104 mEq/L (ref 96–112)
Creatinine, Ser: 0.77 mg/dL (ref 0.50–1.10)
GFR calc non Af Amer: 74 mL/min — ABNORMAL LOW (ref >90)
GFR, EST AFRICAN AMERICAN: 86 mL/min — AB (ref >90)
Glucose, Bld: 123 mg/dL — ABNORMAL HIGH (ref 70–99)
Potassium: 3.5 mmol/L (ref 3.5–5.1)
Sodium: 142 mmol/L (ref 135–145)
Total Bilirubin: 0.8 mg/dL (ref 0.3–1.2)
Total Protein: 5.4 g/dL — ABNORMAL LOW (ref 6.0–8.3)

## 2014-01-31 LAB — GLUCOSE, CAPILLARY
GLUCOSE-CAPILLARY: 89 mg/dL (ref 70–99)
Glucose-Capillary: 106 mg/dL — ABNORMAL HIGH (ref 70–99)
Glucose-Capillary: 126 mg/dL — ABNORMAL HIGH (ref 70–99)

## 2014-01-31 LAB — TROPONIN I: Troponin I: 1.91 ng/mL (ref <0.031)

## 2014-01-31 MED ORDER — METOPROLOL TARTRATE 25 MG/10 ML ORAL SUSPENSION
12.5000 mg | Freq: Two times a day (BID) | ORAL | Status: DC
  Administered 2014-01-31 – 2014-02-03 (×7): 12.5 mg
  Filled 2014-01-31 (×10): qty 5

## 2014-01-31 MED ORDER — ENOXAPARIN SODIUM 40 MG/0.4ML ~~LOC~~ SOLN
40.0000 mg | SUBCUTANEOUS | Status: DC
  Administered 2014-01-31 – 2014-02-03 (×4): 40 mg via SUBCUTANEOUS
  Filled 2014-01-31 (×4): qty 0.4

## 2014-01-31 MED ORDER — VERAPAMIL HCL ER 120 MG PO TBCR
120.0000 mg | EXTENDED_RELEASE_TABLET | Freq: Every day | ORAL | Status: DC
  Filled 2014-01-31: qty 1

## 2014-01-31 MED ORDER — FAMOTIDINE 40 MG/5ML PO SUSR
20.0000 mg | Freq: Every day | ORAL | Status: DC
  Filled 2014-01-31: qty 2.5

## 2014-01-31 MED ORDER — FAMOTIDINE 40 MG/5ML PO SUSR
20.0000 mg | Freq: Every day | ORAL | Status: DC
  Administered 2014-02-01 – 2014-02-03 (×3): 20 mg
  Filled 2014-01-31 (×5): qty 2.5

## 2014-01-31 MED ORDER — ASPIRIN 325 MG PO TABS
325.0000 mg | ORAL_TABLET | Freq: Every day | ORAL | Status: DC
  Administered 2014-01-31 – 2014-02-03 (×4): 325 mg
  Filled 2014-01-31 (×4): qty 1

## 2014-01-31 NOTE — Progress Notes (Signed)
[facility] Progress Note Patient Name: Tanya Butler DOB: 1926/01/28 MRN: [id number]   Date of Service  01/31/2014  HPI/Events of Note  Xray reviewed. Prior abdo had difficult panda in RLL, this one appears in fundyus  eICU Interventions  Will asses pcxr prior to confimtration     Intervention Category Intermediate Interventions: Diagnostic test evaluation  Evarose Altland J. 01/31/2014, 12:30 AM

## 2014-01-31 NOTE — Progress Notes (Signed)
Speech Language Pathology Treatment: Dysphagia;[place] Speaking valve  Patient Details Name: Tanya Butler MRN: [id number] DOB: [birth year] Today's Date: 01/31/2014 Time: 1126-1202 SLP Time Calculation (min) (ACUTE ONLY): 36 min  Assessment / Plan / Recommendation Clinical Impression  Pt continues to wear PMSV well for ~20 minutes, at which point there is mild evidence of air trapping and RR increases up to 35. RR will trend downward with removal of valve. Suspect upper airway patency is impacted by possible edema as well as size of trach.   PO trials were administered with valve in place, with throat clearing noted x1 with ice chips. Pt does have wet vocal quality with small sips of thin liquid, and pureed trials result in delayed coughing and expectoration of applesauce that is likely residual material from pharynx. SLP provided cueing to patient to swallow her saliva as opposed to orally suctioning her secretions, to which pt replied, "it won't go down."   Given the above as well as the events of this weekend, recommend to continue PMSV in brief increments (up to ~20 minutes) throughout the day when supervision can be provided. Will defer objective swallowing test at this time as well.   HPI HPI: 81 year old female with past medical history as outlined below, which includes COPD (RB patient), hypertension, skin cancer, and GERD. She presented to [facility] emergency department 01/19/2014 with respiratory distress. Pt was intubated 12/30-1/1, 1/2-1/3, and again 1/3 until she received trach on 1/5. Pt currently with #6 cuffed Shiley and initiated TCT 1/6.   Pertinent Vitals Pain Assessment: No/denies pain  SLP Plan  Continue with current plan of care    Recommendations Diet recommendations: NPO Medication Administration: Via alternative means      Patient may use Passy-Muir Speech Valve: Intermittently with supervision (~20 minute intervals) PMSV Supervision: Full MD: Please consider  changing trach tube to : Smaller size;Cuffless       Oral Care Recommendations: Oral care Q4 per protocol Follow up Recommendations: Inpatient Rehab Plan: Continue with current plan of care    GO     [name], M.A. CCC-SLP [phone]  [name] 01/31/2014, 12:12 PM

## 2014-01-31 NOTE — Progress Notes (Signed)
Wasted 75 mcg of fentanyl with Shea Stakes in the trash. The patient received 25 mcg per order for c/o low back pain.

## 2014-01-31 NOTE — Consult Note (Signed)
CARDIOLOGY CONSULT NOTE       Patient ID: Tanya Butler MRN: [id number] DOB/AGE: 1926/08/10 81 y.o.  Admit date: 01/19/2014 Referring Physician:  Nelda Marseille Primary Physician: Velna Hatchet, MD Primary Cardiologist:  New Reason for Consultation:  PAF  Active Problems:   Acute respiratory failure   Acute respiratory distress   Encounter for feeding tube placement   Encounter for orogastric (OG) tube placement   Respiratory failure   Endotracheal tube present   ILD (interstitial lung disease)   Encounter for nasogastric (NG) tube placement   Valvular heart disease   HPI:   81 yo with history of HTN, elevated lipids.  Significant COPD admitted with respitory failure with stridor and multiple reintubations now with tracheostomy.  Some respitory distress 1/10 and went into PAF rate over 200  Reviewed ECG and narrow complex not SVT but PAF.  Cardioverted by CCM and returned to NSR.  Telemetry now with NSR and PAC;s  She has back pain this am No chest pain.  Respitory status still tenous despite nebs and steroid taper  Reviewed echo done 01/21/13  Study Conclusions  - Left ventricle: E/e&'>21 suggestive of elevated LV filling pressures. The cavity size was normal. There was moderate focal basal hypertrophy. Systolic function was normal. The estimated ejection fraction was in the range of 60% to 65%. Wall motion was normal; there were no regional wall motion abnormalities. - Aortic valve: Trileaflet; normal thickness, mildly calcified leaflets. There was moderate regurgitation. - Mitral valve: Calcified annulus. There was mild regurgitation. - Left atrium: The atrium was moderately dilated.  ROS All other systems reviewed and negative except as noted above  Past Medical History  Diagnosis Date  . HTN (hypertension)   . Hyperlipidemia   . Restless leg syndrome   . Osteoporosis   . GERD (gastroesophageal reflux disease)   . COPD (chronic obstructive pulmonary  disease)   . Hypertonicity of bladder   . Skin cancer     Family History  Problem Relation Age of Onset  . Emphysema Brother   . Emphysema Brother   . Stroke Sister   . Hypertension Mother   . Breast cancer Sister     History   Social History  . Marital Status: Single    Spouse Name: N/A    Number of Children: N/A  . Years of Education: N/A   Occupational History  . retired from [employer]  . Smoking status: Former Smoker -- 1.00 packs/day for 50 years    Types: Cigarettes    Quit date: 01/21/2005  . Smokeless tobacco: Not on file  . Alcohol Use: No  . Drug Use: Not on file  . Sexual Activity: Not on file   Other Topics Concern  . Not on file   Social History Narrative    Past Surgical History  Procedure Laterality Date  . Skin cancer excision    . Esophagogastroduodenoscopy N/A 07/15/2012    Procedure: ESOPHAGOGASTRODUODENOSCOPY (EGD);  Surgeon: Wonda Horner, MD;  Location: [facility] ENDOSCOPY;  Service: Endoscopy;  Laterality: N/A;  . Esophagogastroduodenoscopy N/A 07/22/2012    Procedure: ESOPHAGOGASTRODUODENOSCOPY (EGD);  Surgeon: Missy Sabins, MD;  Location: [facility] ENDOSCOPY;  Service: Endoscopy;  Laterality: N/A;     . antiseptic oral rinse  7 mL Mouth Rinse QID  . aspirin  325 mg Per Tube Daily  . budesonide  0.25 mg Nebulization BID  . chlorhexidine  15 mL Mouth Rinse BID  . enoxaparin (LOVENOX) injection  40 mg Subcutaneous Q24H  . famotidine  20 mg Per Tube Daily  . ipratropium-albuterol  3 mL Nebulization TID  . metoprolol tartrate  12.5 mg Per Tube BID  . sodium chloride  750 mL Intravenous Once   . feeding supplement (VITAL AF 1.2 CAL) 1,000 mL (01/31/14 1200)    Physical Exam: Blood pressure 99/50, pulse 90, temperature 97.7 F (36.5 C), temperature source Oral, resp. rate 26, height 5\' 2"  (1.575 m), weight 58.4 kg (128 lb 12 oz), SpO2 98 %.   Affect appropriate Chronically ill white female  HEENT: Panda tube  Neck  Trach  collar  JVP normal no bruits no thyromegaly Lungs diffuse rhonchi and  wheezing and good diaphragmatic motion Heart:  S1/S2 no murmur, no rub, gallop or click PMI normal Abdomen: benighn, BS positve, no tenderness, no AAA no bruit.  No HSM or HJR Distal pulses intact with no bruits No edema Neuro non-focal Skin warm and dry No muscular weakness   Labs:   Lab Results  Component Value Date   WBC 14.2* 01/31/2014   HGB 10.7* 01/31/2014   HCT 33.4* 01/31/2014   MCV 94.6 01/31/2014   PLT 237 01/31/2014    Recent Labs Lab 01/31/14 0900  NA 142  K 3.5  CL 104  CO2 31  BUN 35*  CREATININE 0.77  CALCIUM 9.0  PROT 5.4*  BILITOT 0.8  ALKPHOS 68  ALT 36*  AST 40*  GLUCOSE 123*   Lab Results  Component Value Date   CKTOTAL 89 07/13/2012   CKMB 4.2* 07/13/2012   TROPONINI 1.91* 01/31/2014   No results found for: CHOL No results found for: HDL No results found for: [facility] Lab Results  Component Value Date   TRIG 72 01/28/2014   TRIG 85 01/22/2014   TRIG 56 01/19/2014   No results found for: CHOLHDL No results found for: LDLDIRECT    . antiseptic oral rinse  7 mL Mouth Rinse QID  . aspirin  325 mg Per Tube Daily  . budesonide  0.25 mg Nebulization BID  . chlorhexidine  15 mL Mouth Rinse BID  . enoxaparin (LOVENOX) injection  40 mg Subcutaneous Q24H  . famotidine  20 mg Per Tube Daily  . ipratropium-albuterol  3 mL Nebulization TID  . metoprolol tartrate  12.5 mg Per Tube BID  . sodium chloride  750 mL Intravenous Once   Radiology: Dg Chest 2 View  01/30/2014   CLINICAL DATA:  COPD exacerbation  EXAM: CHEST  2 VIEW  COMPARISON:  01/28/2014  FINDINGS: Cardiomegaly with pulmonary vascular congestion. Possible mild right perihilar edema.  Mild patchy bilateral lower lobe opacities, likely atelectasis. No pneumothorax.  Tracheostomy in satisfactory position.  Enteric tube coursing into the stomach.  IMPRESSION: Cardiomegaly with pulmonary vascular congestion and  possible mild right perihilar edema.  Patchy bilateral lower lobe opacities, likely atelectasis.   Electronically Signed   By: Julian Hy M.D.   On: 01/30/2014 07:30   Ct Chest High Resolution  01/20/2014   CLINICAL DATA:  81 year old female with history of acute respiratory failure. Evaluate for interstitial lung disease.  EXAM: CT CHEST WITHOUT CONTRAST  TECHNIQUE: Multidetector CT imaging of the chest was performed following the standard protocol without intravenous contrast. High resolution imaging of the lungs, as well as inspiratory and expiratory imaging, was performed.  COMPARISON:  No priors.  FINDINGS: Mediastinum: Heart size is mildly enlarged. There is no significant pericardial fluid, thickening or pericardial calcification. There is atherosclerosis of the  thoracic aorta, the great vessels of the mediastinum and the coronary arteries, including calcified atherosclerotic plaque in the left main, left anterior descending and right coronary arteries. Calcifications of the aortic valve and mitral annulus. Multiple borderline enlarged mediastinal and hilar lymph nodes, presumably reactive. Esophagus is unremarkable in appearance. Patient is intubated, with the tip of the endotracheal tube less than 1 cm above the level of the carina. Nasogastric tube extending at least of the stomach (tube extends below the lower margin of the images).  Lungs/Pleura: High-resolution images are limited by patient motion. There does appear to be relatively diffuse ground-glass attenuation with some interlobular septal thickening, favored to reflect a background of mild interstitial pulmonary edema. Given the presence of presumed edema, accurate assessment for interstitial lung disease is limited. With these limitations in mind, no definite areas of subpleural reticulation, parenchymal banding, traction bronchiectasis or frank honeycombing are appreciated. Inspiratory and expiratory imaging appears to demonstrates a  mild air trapping, suggesting small airways disease. In addition, there is a focal area of airspace consolidation with some air bronchograms in the right lower lobe, presumably an acute pneumonia. Small bilateral pleural effusions layering dependently.  Upper Abdomen: Small calcifications in the liver, presumably calcified granulomas.  Musculoskeletal: Old compression fracture of T9 with approximately 70% loss of anterior vertebral body height and 25% loss of posterior vertebral body height, unchanged compared to prior chest x-ray 07/21/2012. There are no aggressive appearing lytic or blastic lesions noted in the visualized portions of the skeleton.  IMPRESSION: 1. Despite the mild limitations of today's examination, there is no definitive evidence to suggest interstitial lung disease at this time. 2. Right lower lobe airspace consolidation and air bronchograms concerning for right lower lobe pneumonia. 3. In addition, there is mild cardiomegaly with small bilateral pleural effusions and a background of mild interstitial pulmonary edema; imaging findings suggestive of underlying congestive heart failure. 4. Support apparatus, as above. Endotracheal tube is in a low position less than 1 cm above the carina. Consider withdrawal 2-3 cm for more optimal placement. 5. Atherosclerosis, including left main and 2 vessel coronary artery disease. These results will be called to the ordering clinician or representative by the Radiologist Assistant, and communication documented in the PACS or zVision Dashboard.   Electronically Signed   By: Vinnie Langton M.D.   On: 01/20/2014 15:27   Dg Chest Port 1 View  01/31/2014   CLINICAL DATA:  Aspiration pneumonia.  EXAM: PORTABLE CHEST - 1 VIEW  COMPARISON:  Chest radiograph 1 day prior.  FINDINGS: Tracheostomy tube remains at the thoracic inlet. Weighted enteric tube is seen, the previous coiling in the region of gastroesophageal junction is no longer present. The tip remains in  the region of the stomach. Slight decrease in cardiomegaly. Small bilateral pleural effusions are again seen, not significantly changed. Slight decrease in pulmonary edema.  IMPRESSION: 1. Coiling of the enteric tube in the region of the gastroesophageal junction on prior exam is no longer seen. The tip remains in the stomach. 2. Persistent but improving congestive heart failure.   Electronically Signed   By: Jeb Levering M.D.   On: 01/31/2014 01:26   Portable Chest Xray  01/30/2014   CLINICAL DATA:  81 year old female with acute respiratory failure.  EXAM: PORTABLE CHEST - 1 VIEW  COMPARISON:  Chest x-ray 01/30/2014  FINDINGS: A tracheostomy tube is in place with tip 5.1 cm above the carina. Metallic tipped feeding tube present in the stomach, with potential knot in the distal  aspect of the feeding tube. Transcutaneous defibrillator pad seen projecting over the upper abdomen and lower left hemithorax. Lung volumes are low. Insert mild edema. Small bilateral pleural effusions (right greater than left). Mild cardiomegaly. The patient is rotated to the right on today's exam, resulting in distortion of the mediastinal contours and reduced diagnostic sensitivity and specificity for mediastinal pathology. Atherosclerosis in the thoracic aorta.  IMPRESSION: 1. Support apparatus, as above. In addition to slight retraction of the feeding tube, the feeding tube now appears coiled and likely knotted in the proximal stomach. 2. The appearance the chest suggests congestive heart failure, as above. 3. Atherosclerosis. These results will be called to the ordering clinician or representative by the Radiologist Assistant, and communication documented in the PACS or zVision Dashboard.   Electronically Signed   By: Vinnie Langton M.D.   On: 01/30/2014 13:19   Dg Chest Port 1 View  01/28/2014   CLINICAL DATA:  Shortness of breath  EXAM: PORTABLE CHEST - 1 VIEW  COMPARISON:  01/27/2014  FINDINGS: Cardiac shadow remains  enlarged. A feeding catheter and tracheostomy tube are again seen and stable. Small bilateral pleural effusions are noted right greater than left with mild basilar atelectasis. These have increased slightly in the interval from the prior exam.  IMPRESSION: Bilateral pleural effusions and basilar atelectasis. The overall appearance has worsened slightly in the right base when compare with the prior exam.   Electronically Signed   By: Inez Catalina M.D.   On: 01/28/2014 07:26   Dg Chest Port 1 View  01/27/2014   CLINICAL DATA:  81 year old female with acute respiratory failure, shortness of breath, fluid overload. Initial encounter.  EXAM: PORTABLE CHEST - 1 VIEW  COMPARISON:  01/25/2014 and earlier.  FINDINGS: Portable AP semi upright view at 0729 hr. Tracheostomy tube again projects in midline. There are then, 2 new tubes also projecting over midline and seem to course into the left upper quadrant.  Stable lung volumes. Mildly decreased pulmonary vascular congestion. Veiling basilar and retrocardiac opacity suggesting pleural effusions superimposed on lower lobe atelectasis. No pneumothorax.  IMPRESSION: 1. Stable tracheostomy tube. Query interval placement of 2 enteric tubes, also projecting over midline. 2. Regressed but not resolved pulmonary edema. Pleural effusions and lower lobe atelectasis suspected.   Electronically Signed   By: Lars Pinks M.D.   On: 01/27/2014 07:52   Dg Chest Port 1 View  01/25/2014   CLINICAL DATA:  Shortness of breath, tracheostomy placement  EXAM: PORTABLE CHEST - 1 VIEW  COMPARISON:  01/24/2014  FINDINGS: Borderline cardiomegaly. Endotracheal and NG tube has been removed. Tracheostomy tube in place. Slight worsening in aeration with mild interstitial prominence bilaterally suspicious for interstitial edema. Small right pleural effusion with right basilar atelectasis or infiltrate. Trace left pleural effusion with left basilar atelectasis.  IMPRESSION: Tracheostomy tube in place.  Slight worsening in aeration with mild interstitial prominence bilaterally suspicious for interstitial edema. Small right pleural effusion with right basilar atelectasis or infiltrate. Trace left pleural effusion with left basilar atelectasis. No pneumothorax.   Electronically Signed   By: Lahoma Crocker M.D.   On: 01/25/2014 15:16   Dg Chest Port 1 View  01/24/2014   CLINICAL DATA:  Check endotracheal tube, shortness of Breath  EXAM: PORTABLE CHEST - 1 VIEW  COMPARISON:  01/22/2014  FINDINGS: Cardiac shadow is stable. Endotracheal tube is seen 2 cm above the carinal. This is relatively stable in appearance from the prior exam. Nasogastric catheter is seen within the stomach.  Increasing right-sided effusion is noted. Increasing left retrocardiac density is also seen.  IMPRESSION: Tubes and lines as described.  Increasing bibasilar changes with effusion on the right and left lower lobe atelectasis.   Electronically Signed   By: Inez Catalina M.D.   On: 01/24/2014 07:47   Dg Chest Port 1 View  01/22/2014   CLINICAL DATA:  Re-evaluation of endotracheal tube positioning after retracting endotracheal tube  EXAM: PORTABLE CHEST - 1 VIEW  COMPARISON:  January 22, 2014 at 6:43 a.m.  FINDINGS: Endotracheal tube now identified with tip 19 mm above the carina. No change in NG tube. Stable mild cardiac enlargement and pulmonary vascular congestion.  IMPRESSION: Endotracheal tube tip 19 mm above the carina.   Electronically Signed   By: Skipper Cliche M.D.   On: 01/22/2014 10:41   Dg Chest Port 1 View  01/22/2014   CLINICAL DATA:  Respiratory failure.  EXAM: PORTABLE CHEST - 1 VIEW  COMPARISON:  01/21/2014  FINDINGS: Endotracheal tube tip is advanced to the origin of the right mainstem bronchus. The tip measures about 4 cm below the desired position above the carina. Enteric tube present and coiled in the left upper quadrant consistent with location in the stomach. Mild cardiac enlargement and pulmonary vascular congestion. No  definite edema or consolidation. No pneumothorax.  IMPRESSION: Endotracheal tube tip is advanced into the right mainstem bronchus, bowel 4 cm below the expected location above the carina.  These results were called by telephone at the time of interpretation on 01/22/2014 at 6:59 am to [facility], ICU nurse, who verbally acknowledged these results.   Electronically Signed   By: Lucienne Capers M.D.   On: 01/22/2014 07:01   Dg Chest Port 1 View  01/21/2014   CLINICAL DATA:  Interstitial lung disease, acute respiratory failure. History of hypertension and COPD.  EXAM: PORTABLE CHEST - 1 VIEW  COMPARISON:  01/20/2014  FINDINGS: Support devices are in stable position. Mild cardiomegaly with vascular congestion. Further improvement in interstitial opacities, particularly in the right lung, likely improving edema. Mild patchy bilateral infrahilar opacities persist. No effusions.  IMPRESSION: Improving interstitial opacities with mild residual bilateral perihilar and infrahilar opacities.   Electronically Signed   By: Rolm Baptise M.D.   On: 01/21/2014 09:23   Dg Chest Port 1 View  01/20/2014   CLINICAL DATA:  Re-evaluate airspace disease, acute respiratory failure  EXAM: PORTABLE CHEST - 1 VIEW  COMPARISON:  Portable chest x-ray of January 19, 2014  FINDINGS: The lungs are hypoinflated. The endotracheal tube tip lies 1.8 cm above the crotch of the carina. The pulmonary interstitial markings are less conspicuous today but remain increased. The cardiopericardial silhouette is top-normal in size. The pulmonary vascularity is less engorged. The esophagogastric tube project below the inferior margin of the image.  IMPRESSION: There has been slight interval improvement in the appearance of the pulmonary interstitium which may reflect some resolving interstitial edema. The endotracheal tube tip lies 1.8 cm above the crotch of the carina. Withdrawal by 1 2 cm is recommended to avoid accidental mainstem bronchus intubation.    Electronically Signed   By: David  Martinique   On: 01/20/2014 07:37   Dg Chest Portable 1 View  01/19/2014   CLINICAL DATA:  Shortness of breath. History of COPD and hypertension.  EXAM: PORTABLE CHEST - 1 VIEW  COMPARISON:  07/21/2012  FINDINGS: Endotracheal tube is in place. The tip is 2 cm above the carina. NG tube is in the stomach.  Heart  is borderline in size. Patchy bilateral airspace disease, right greater than left. This could represent asymmetric edema or infection. No effusions. No acute bony abnormality.  IMPRESSION: Patchy bilateral airspace disease, right greater than left. This could represent asymmetric edema or infection.   Electronically Signed   By: Rolm Baptise M.D.   On: 01/19/2014 16:42   Dg Abd Portable 1v  01/31/2014   CLINICAL DATA:  Assess feeding tube placement. Subsequent encounter.  EXAM: PORTABLE ABDOMEN - 1 VIEW  COMPARISON:  Abdominal radiograph performed earlier today at 9:36 p.m.  FINDINGS: The feeding tube is now seen ending overlying the gastric fundus. On correlation with subsequent chest radiograph, this does extend along the course of the distal esophagus.  The visualized bowel gas pattern is grossly unremarkable. No free intra-abdominal air is seen, though evaluation for free air is limited on a single supine view.  No acute osseous abnormalities are identified. There is mild axial joint space narrowing at the right hip.  IMPRESSION: Feeding tube noted ending overlying the gastric fundus.   Electronically Signed   By: Garald Balding M.D.   On: 01/31/2014 02:00   Dg Abd Portable 1v  01/31/2014   CLINICAL DATA:  Feeding tube placement.  EXAM: PORTABLE ABDOMEN - 1 VIEW  COMPARISON:  Abdominal radiograph performed 01/27/2014  FINDINGS: The patient's feeding tube is noted ending overlying the right lung base.  The visualized bowel gas pattern is grossly unremarkable. No free intra-abdominal air is seen, though evaluation is limited on a single supine view. Left basilar  airspace opacification may reflect atelectasis or possibly pneumonia.  No acute osseous abnormalities are seen. There is mild axial joint space narrowing at the right hip.  IMPRESSION: 1. Feeding tube noted ending overlying the right lung base. This was subsequently removed and readvanced, shortly after the study was performed. 2. Left basilar airspace opacification may reflect atelectasis or possibly pneumonia.   Electronically Signed   By: Garald Balding M.D.   On: 01/31/2014 01:47   Dg Abd Portable 1v  01/27/2014   CLINICAL DATA:  Tube placement.  EXAM: PORTABLE ABDOMEN - 1 VIEW  COMPARISON:  01/26/2014.  FINDINGS: Feeding tube again noted projected over the distal stomach. Gas pattern is nonspecific. No free air identified. Cardiomegaly. Left lower lobe atelectasis and/or infiltrate.Left pleural effusion.  IMPRESSION: 1. Feeding tube in stable position. 2. Left lower lobe atelectasis and/or infiltrate with left pleural effusion. 3. Cardiomegaly.   Electronically Signed   By: Marcello Moores  Register   On: 01/27/2014 07:36   Dg Abd Portable 1v  01/26/2014   CLINICAL DATA:  Encounter for nasogastric tube placement.  EXAM: PORTABLE ABDOMEN - 1 VIEW  COMPARISON:  January 24, 2014.  FINDINGS: The bowel gas pattern is normal. Distal tip of feeding tube is seen in the expected position of the distal stomach. No radio-opaque calculi or other significant radiographic abnormality are seen.  IMPRESSION: Distal tip of feeding tube seen in expected position of distal stomach.   Electronically Signed   By: Sabino Dick M.D.   On: 01/26/2014 07:35   Dg Abd Portable 1v  01/24/2014   CLINICAL DATA:  81 year old female with enteric tube placement. Initial encounter. Current history of acute respiratory failure.  EXAM: PORTABLE ABDOMEN - 1 VIEW  COMPARISON:  01/22/2014 and earlier.  FINDINGS: Portable AP view at 0509 hrs. Enteric tube re - identified, and the tip has advanced just across midline projecting in the lower abdomen. The  side hole projects just to  the left of the spine. Interval decreased bowel gas, non obstructed pattern. Mildly increased retrocardiac opacity. No definite pneumoperitoneum on this single image.  IMPRESSION: 1. Enteric tube tip has advanced just across midline in the lower abdomen compatible with antral placement within a ptotic stomach. Side hole at the level of the distal gastric body. 2. Non obstructed bowel gas pattern. 3. Increased streaky left lung base opacity.   Electronically Signed   By: Lars Pinks M.D.   On: 01/24/2014 07:51   Dg Abd Portable 1v  01/22/2014   CLINICAL DATA:  Nasogastric tube placement at bedside.  EXAM: PORTABLE ABDOMEN - 1 VIEW  COMPARISON:  None.  FINDINGS: Nasogastric tube looped in the stomach with its tip in the mid to distal body. Bowel gas pattern unremarkable without evidence of obstruction or significant ileus. External cardiac pacing pads overlie the left upper abdomen.  IMPRESSION: 1. Nasogastric tube looped in the stomach with its tip in the mid to distal body of the stomach. 2. No acute abdominal abnormality.   Electronically Signed   By: Evangeline Dakin M.D.   On: 01/22/2014 07:58    EKG:  SR LVH no acute ST changes    ASSESSMENT AND PLAN:   PAF:  In setting of ongoing respiratory failure.  Agree with bid beta blocker per panda tube for now.  Would not complicate clinical course and trach with anticoagulation at this point Can consider adding antiarrhythmic for any recurrence   This patients CHA2DS2-VASc Score and unadjusted Ischemic Stroke Rate (% per year) is equal to 3.2 % stroke rate/year from a score of 3  Above score calculated as 1 point each if present [CHF, HTN, DM, Vascular=MI/PAD/Aortic Plaque, Age if 65-74, or Female] Above score calculated as 2 points each if present [Age > 75, or Stroke/TIA/TE]  Respirotry Failure:  Plan per CCM  Continue trach care with nebs currently not on steroids or antibiotics   Elevated troponin  CPK ok post [place] no  acute ECG changes not likely significant   Signed: Jenkins Rouge 01/31/2014, 1:04 PM

## 2014-01-31 NOTE — Progress Notes (Signed)
PULMONARY / CRITICAL CARE MEDICINE   Name: Tanya Butler MRN: [id number] DOB: 07/27/1926    ADMISSION DATE:  01/19/2014 CONSULTATION DATE:  01/19/2014  REFERRING MD :  EDP  CHIEF COMPLAINT:  Respiratory distress  INITIAL PRESENTATION:  80 year old female with past medical history as outlined below, which includes COPD (RB patient), hypertension, skin cancer, and GERD. She presented to [facility] emergency department 01/19/2014 with respiratory distress.  SIGNIFICANT EVENTS: 01/19/2014 -admt 01/20/14 -  Patient  And family denied to RN that there was dust exposure. Patient went upto attic and just got very dyspneic. Failed SBT earlier. CT - with pneumonia. No ILD. RF  And all autoimmune- negative 01/21/14: Extubated. Repeat tracheal aspirate sent - 01/22/2014: reintubated earlier due to agitation - seems to have recurent sun downinng. This AM was hypotensive - Rx neo aftter failing fluid bolus.  01/23/14: extubated but re intubated in the afternoon due to stridor  01/23/14 reintubated after developing stridor. Had swollen arytenoid. 01/24/14: No events.  01/25/14: trached. Reduced steroid to 60mg  BID. 1/6 and 1/7: remained on trach collar. Reduced steroid to 20 daily. 1/10: now in AF w/ RVR HR 200s. Moved back to ICU, Placed back on vent. Cardioverted   SUBJECTIVE/OVERNIGHT/INTERVAL HX Complains of chest pain, unchanged from yesterday  VITAL SIGNS: Temp:  [98.3 F (36.8 C)-99.3 F (37.4 C)] 98.3 F (36.8 C) (01/11 0755) Pulse Rate:  [87-105] 92 (01/11 0731) Resp:  [0-31] 21 (01/11 0731) BP: (94-123)/(43-59) 108/52 mmHg (01/11 0731) SpO2:  [95 %-100 %] 100 % (01/11 0731) FiO2 (%):  [40 %-100 %] 40 % (01/11 0731) Weight:  [58.4 kg (128 lb 12 oz)] 58.4 kg (128 lb 12 oz) (01/11 0500) HEMODYNAMICS:   VENTILATOR SETTINGS: Vent Mode:  [-] PRVC FiO2 (%):  [40 %-100 %] 40 % Set Rate:  [18 bmp] 18 bmp Vt Set:  [460 mL] 460 mL PEEP:  [5 cmH20] 5 cmH20 Plateau Pressure:  [19 FYB01-75  cmH20] 19 cmH20 INTAKE / OUTPUT:  Intake/Output Summary (Last 24 hours) at 01/31/14 0818 Last data filed at 01/31/14 0600  Gross per 24 hour  Intake    140 ml  Output   1675 ml  Net  -1535 ml    PHYSICAL EXAMINATION: General:  Elderly female on trach collar, and panda tube, calm, cooperative, lying in bed, anxious  Neuro:  RASS 0, moves all 4s, interactive HEENT:  [place]/AT, trach in place, normal trach site, No JVD noted Cardiovascular:  RRR, systolic loud murmur LUSB Lungs:  Coarse bilaterally Abdomen:  Soft, non-tender, non-distended Musculoskeletal:  No acute deformity or edema.  Skin:  Grossly intact  LABS: PULMONARY  Recent Labs Lab 01/30/14 1207  PHART 7.452*  PCO2ART 53.6*  PO2ART 291.0*  HCO3 36.9*  TCO2 38.5  O2SAT 99.9    CBC  Recent Labs Lab 01/24/14 0838 01/25/14 0702 01/27/14 0915  HGB 11.6* 10.2* 12.1  HCT 33.7* 31.8* 36.9  WBC 11.0* 9.1 12.2*  PLT 220 184 263    COAGULATION  Recent Labs Lab 01/25/14 0702  INR 1.12    CARDIAC    Recent Labs Lab 01/30/14 1840 01/30/14 2100 01/31/14 0540  TROPONINI 3.50* 3.13* 1.91*   No results for input(s): PROBNP in the last 168 hours.   CHEMISTRY  Recent Labs Lab 01/24/14 1025 01/24/14 1837  01/25/14 0702 01/27/14 0305 01/28/14 0327 01/29/14 0329 01/30/14 0323  NA  --   --   --  139 144 142 139 142  K  --   --   < >  3.9 3.8 3.6 3.8 3.9  CL  --   --   --  112 112 103 98 100  CO2  --   --   --  23 28 34* 31 39*  GLUCOSE  --   --   --  169* 114* 126* 131* 110*  BUN  --   --   --  26* 24* 22 33* 30*  CREATININE  --   --   --  0.60 0.55 0.61 0.67 0.72  CALCIUM  --   --   --  7.3* 7.9* 8.2* 8.4 9.3  MG 2.1 2.2  --  2.4 2.3 2.2  --   --   PHOS 1.8* 2.2*  --  1.8* 1.9* 2.0*  --   --   < > = values in this interval not displayed. Estimated Creatinine Clearance: 40.7 mL/min (by C-G formula based on Cr of 0.72).   LIVER  Recent Labs Lab 01/25/14 0702 01/30/14 0323  AST  --  26  ALT   --  30  ALKPHOS  --  75  BILITOT  --  0.7  PROT  --  5.0*  ALBUMIN  --  2.5*  INR 1.12  --      INFECTIOUS No results for input(s): LATICACIDVEN, PROCALCITON in the last 168 hours.   ENDOCRINE CBG (last 3)   Recent Labs  01/30/14 2005 01/31/14 0013 01/31/14 0307  GLUCAP 81 89 106*      IMAGING x48h Dg Chest 2 View  01/30/2014   CLINICAL DATA:  COPD exacerbation  EXAM: CHEST  2 VIEW  COMPARISON:  01/28/2014  FINDINGS: Cardiomegaly with pulmonary vascular congestion. Possible mild right perihilar edema.  Mild patchy bilateral lower lobe opacities, likely atelectasis. No pneumothorax.  Tracheostomy in satisfactory position.  Enteric tube coursing into the stomach.  IMPRESSION: Cardiomegaly with pulmonary vascular congestion and possible mild right perihilar edema.  Patchy bilateral lower lobe opacities, likely atelectasis.   Electronically Signed   By: Julian Hy M.D.   On: 01/30/2014 07:30   Dg Chest Port 1 View  01/31/2014   CLINICAL DATA:  Aspiration pneumonia.  EXAM: PORTABLE CHEST - 1 VIEW  COMPARISON:  Chest radiograph 1 day prior.  FINDINGS: Tracheostomy tube remains at the thoracic inlet. Weighted enteric tube is seen, the previous coiling in the region of gastroesophageal junction is no longer present. The tip remains in the region of the stomach. Slight decrease in cardiomegaly. Small bilateral pleural effusions are again seen, not significantly changed. Slight decrease in pulmonary edema.  IMPRESSION: 1. Coiling of the enteric tube in the region of the gastroesophageal junction on prior exam is no longer seen. The tip remains in the stomach. 2. Persistent but improving congestive heart failure.   Electronically Signed   By: Jeb Levering M.D.   On: 01/31/2014 01:26   Portable Chest Xray  01/30/2014   CLINICAL DATA:  81 year old female with acute respiratory failure.  EXAM: PORTABLE CHEST - 1 VIEW  COMPARISON:  Chest x-ray 01/30/2014  FINDINGS: A tracheostomy tube  is in place with tip 5.1 cm above the carina. Metallic tipped feeding tube present in the stomach, with potential knot in the distal aspect of the feeding tube. Transcutaneous defibrillator pad seen projecting over the upper abdomen and lower left hemithorax. Lung volumes are low. Insert mild edema. Small bilateral pleural effusions (right greater than left). Mild cardiomegaly. The patient is rotated to the right on today's exam, resulting in distortion of the mediastinal contours  and reduced diagnostic sensitivity and specificity for mediastinal pathology. Atherosclerosis in the thoracic aorta.  IMPRESSION: 1. Support apparatus, as above. In addition to slight retraction of the feeding tube, the feeding tube now appears coiled and likely knotted in the proximal stomach. 2. The appearance the chest suggests congestive heart failure, as above. 3. Atherosclerosis. These results will be called to the ordering clinician or representative by the Radiologist Assistant, and communication documented in the PACS or zVision Dashboard.   Electronically Signed   By: Vinnie Langton M.D.   On: 01/30/2014 13:19   Dg Abd Portable 1v  01/31/2014   CLINICAL DATA:  Assess feeding tube placement. Subsequent encounter.  EXAM: PORTABLE ABDOMEN - 1 VIEW  COMPARISON:  Abdominal radiograph performed earlier today at 9:36 p.m.  FINDINGS: The feeding tube is now seen ending overlying the gastric fundus. On correlation with subsequent chest radiograph, this does extend along the course of the distal esophagus.  The visualized bowel gas pattern is grossly unremarkable. No free intra-abdominal air is seen, though evaluation for free air is limited on a single supine view.  No acute osseous abnormalities are identified. There is mild axial joint space narrowing at the right hip.  IMPRESSION: Feeding tube noted ending overlying the gastric fundus.   Electronically Signed   By: Garald Balding M.D.   On: 01/31/2014 02:00   Dg Abd Portable  1v  01/31/2014   CLINICAL DATA:  Feeding tube placement.  EXAM: PORTABLE ABDOMEN - 1 VIEW  COMPARISON:  Abdominal radiograph performed 01/27/2014  FINDINGS: The patient's feeding tube is noted ending overlying the right lung base.  The visualized bowel gas pattern is grossly unremarkable. No free intra-abdominal air is seen, though evaluation is limited on a single supine view. Left basilar airspace opacification may reflect atelectasis or possibly pneumonia.  No acute osseous abnormalities are seen. There is mild axial joint space narrowing at the right hip.  IMPRESSION: 1. Feeding tube noted ending overlying the right lung base. This was subsequently removed and readvanced, shortly after the study was performed. 2. Left basilar airspace opacification may reflect atelectasis or possibly pneumonia.   Electronically Signed   By: Garald Balding M.D.   On: 01/31/2014 01:47    ASSESSMENT / PLAN:  PULMONARY OETT 12/30>01/21/14, 01/22/14 >> 01/25/14 trached 01/25/14 due to several reintubations 2/2 to airway edema/injury  A: BaselineHx of COPD NOS - AFL on spiro - follows with [facility]  ? Baseline CXR July 2015 with ?? ILD change but CT 01/20/14 showed onl;y PNA. No ILD Acute resp failure - improving.  Upper airway trauma /edema  On trach collar now since 01/25/14. Back on vent for cardioversion 1/10  P:   On trach collar, tolerating well  Discontinue steroids PAD protocol   CARDIOVASCULAR A:  Was hypotensive, now normal. Baseline MAP likely low. BNP elevation - 06/2012 echo with LVEF 60-65% AF w/ RVR and shock- resolved NSTEMI P:  Cardioverted 1/10- NSR Hold Lasix Start Metoprolol 12.5 mg BID Cardiology consulted  RENAL A:   Severe low phos improving with repletion P:   Awaiting repeat cmet Phos replaced yesterday Hold Lasix   GASTROINTESTINAL A:   H/o GERD  P:   SUP: IV pepcid Cont TF through panda tube.  SLP recommended FEES before PO. Awaiting. Avoid peg  HEMATOLOGIC A:   dvt  prevention Some leukocytosis - likely steroid induced.  P:  Lovenox for VTE ppx PT to ambulate  INFECTIOUS  A:   Leukocytosis still going on.  Likely steroid induced now. CAP vs aspiration - PCT profile c/w localized infection. CT showed pneumonia  - admit trach aspirate with GPC in pairs repeat trach aspirate sent 01/21/14 due to tan color - normal flora  - currently afebrile. Was treated with abx with levaquin until 01/26/14.   P:   Abx: Ceftriaxone 12/30 >1231 Abx: Azithromycin 12/30 >12/312 IV levaquin 01/21/14 >> stopped 1/6.   Afebrile. D/c foley cath.   ENDOCRINE  A: Minimal hyperglycemia  P:   CBG and SSI D/c steroids  NEUROLOGIC A:  metabolic encephalopathy at night - resolved  P:   RASS goal: -1 to 0 wua Active PT / OT  FAMILY  - Updates: Niece (HCPOA) and friend updated ad at admit 01/19/2014. Patient updated 01/22/2014.  Niece updated and patient 01/23/14  Transfer to Step down unit today.  Albin Felling, MD Internal Medicine Resident, PGY-1  PCCM ATTENDING: I have reviewed pt's initial presentation, consultants notes and hospital database in detail.  The above assessment and plan was formulated under my direction.  Comfortable on ATC Maintaining NSR BP adequate and stable Cognition intact Transfer to SDU Change vent support to PRN only (no mandatory nocturnal vent)   Merton Border, MD;  PCCM service; Mobile [phone]

## 2014-01-31 NOTE — Progress Notes (Signed)
Physical Therapy Treatment Patient Details Name: Tanya Butler MRN: [id number] DOB: 1926-09-21 Today's Date: 01/31/2014    History of Present Illness 81 year old female with past medical history that includes COPD , hypertension, skin cancer, and GERD. She presented to [facility] emergency department 01/19/2014 with respiratory distress. Earlier that day she was in her attic cleaning was found by her family 15 minutes later, she was having trouble breathing and her lips were noted to be blue. ETT 12/30-1/1, 1/2-1/3 reintubated 1/3. Tracheostomy 1/5.1/10 pt with Afib s/p cardioversion and back on vent til 1/11    PT Comments    [name] is moving very well with increased gait distance despite medical complications over the weekend. Pt maintaining sats with activity but did not utilize PMSV at this time as just finished with SLP. Pt encouraged to continued bil LE HEP and mobility. Will continue to follow.   Follow Up Recommendations  LTACH;Supervision for mobility/OOB     Equipment Recommendations       Recommendations for Other Services       Precautions / Restrictions Precautions Precautions: Fall Precaution Comments: trach, panda    Mobility  Bed Mobility Overal bed mobility: Needs Assistance       Supine to sit: Supervision     General bed mobility comments: supervision for lines with cues to scoot to EOB  Transfers     Transfers: Sit to/from Stand Sit to Stand: Min guard         General transfer comment: cues for hand placement with assist to manage lines  Ambulation/Gait   Ambulation Distance (Feet): 40 Feet Assistive device: Rolling walker (2 wheeled) Gait Pattern/deviations: Step-through pattern;Decreased stride length   Gait velocity interpretation: Below normal speed for age/gender General Gait Details: cues for posture with fatigue limiting distance, pt bumped to 50% venturi for gait , sats remained 97% throughout.    Stairs             Wheelchair Mobility    Modified Rankin (Stroke Patients Only)       Balance Overall balance assessment: Needs assistance   Sitting balance-Leahy Scale: Good       Standing balance-Leahy Scale: Fair                      Cognition Arousal/Alertness: Awake/alert Behavior During Therapy: WFL for tasks assessed/performed Overall Cognitive Status: Within Functional Limits for tasks assessed                      Exercises General Exercises - Lower Extremity Hip ABduction/ADduction: AROM;Both;15 reps;Seated Straight Leg Raises: AROM;Both;20 reps;Seated    General Comments        Pertinent Vitals/Pain Pain Assessment: No/denies pain Pain Score: 3  Pain Location: back Pain Descriptors / Indicators: Aching Pain Intervention(s): Repositioned    Home Living                      Prior Function            PT Goals (current goals can now be found in the care plan section) Progress towards PT goals: Progressing toward goals    Frequency       PT Plan Discharge plan needs to be updated (secondary to CIR unable to get insurance authorization)    Co-evaluation             End of Session Equipment Utilized During Treatment: Oxygen Activity Tolerance: Patient tolerated treatment well Patient left: in  chair;with call bell/phone within reach     Time: 1158-1222 PT Time Calculation (min) (ACUTE ONLY): 24 min  Charges:  $Gait Training: 8-22 mins $Therapeutic Exercise: 8-22 mins                    G Codes:      Melford Aase 02-23-2014, 12:36 PM Elwyn Reach, [place]

## 2014-02-01 DIAGNOSIS — Z930 Tracheostomy status: Secondary | ICD-10-CM

## 2014-02-01 DIAGNOSIS — I4891 Unspecified atrial fibrillation: Secondary | ICD-10-CM

## 2014-02-01 NOTE — Clinical Social Work Note (Signed)
Patient has a potential bed at [facility] once medically stable for discharge. Per Speech evaluation, patient has recommendations for DYS 2 diet. Once pt's diet is adjusted CSW will update FL-2 and re-fax to SNF's for approval/ additional bed offers.  CSW continues to follow pt and pt's family for continued support and to facilitate pt's discharge needs once medically stable.    FL-2 on chart for MD signature.   Glendon Axe, MSW, LCSWA [phone] 02/01/2014 4:34 PM

## 2014-02-01 NOTE — Progress Notes (Signed)
PULMONARY / CRITICAL CARE MEDICINE   Name: Tanya Butler MRN: [id number] DOB: 30-Jun-1926    ADMISSION DATE:  01/19/2014 CONSULTATION DATE:  01/19/2014  REFERRING MD :  EDP  CHIEF COMPLAINT:  Respiratory distress  INITIAL PRESENTATION:  81 year old female with past medical history as outlined below, which includes COPD (RB patient), hypertension, skin cancer, and GERD. She presented to [facility] emergency department 01/19/2014 with respiratory distress.  SIGNIFICANT EVENTS: 01/19/2014 -admt 01/20/14 -  Patient  And family denied to RN that there was dust exposure. Patient went upto attic and just got very dyspneic. Failed SBT earlier. CT - with pneumonia. No ILD. RF  And all autoimmune- negative 01/21/14: Extubated. Repeat tracheal aspirate sent - 01/22/2014: reintubated earlier due to agitation - seems to have recurent sun downinng. This AM was hypotensive - Rx neo aftter failing fluid bolus.  01/23/14: extubated but re intubated in the afternoon due to stridor  01/23/14 reintubated after developing stridor. Had swollen arytenoid. 01/24/14: No events.  01/25/14: trached. Reduced steroid to 60mg  BID. 1/6 and 1/7: remained on trach collar. Reduced steroid to 20 daily. 1/10: now in AF w/ RVR HR 200s. Moved back to ICU, Placed back on vent. Cardioverted   SUBJECTIVE/OVERNIGHT/INTERVAL HX Tolerating trach collar well. Has no complaints.   VITAL SIGNS: Temp:  [97.5 F (36.4 C)-98.4 F (36.9 C)] 97.7 F (36.5 C) (01/12 0332) Pulse Rate:  [84-103] 90 (01/12 0600) Resp:  [14-32] 24 (01/12 0600) BP: (87-126)/(33-84) 126/54 mmHg (01/12 0600) SpO2:  [92 %-100 %] 96 % (01/12 0600) FiO2 (%):  [28 %-40 %] 28 % (01/12 0303) Weight:  [58.1 kg (128 lb 1.4 oz)] 58.1 kg (128 lb 1.4 oz) (01/12 0500) HEMODYNAMICS:   VENTILATOR SETTINGS: Vent Mode:  [-]  FiO2 (%):  [28 %-40 %] 28 % INTAKE / OUTPUT:  Intake/Output Summary (Last 24 hours) at 02/01/14 0700 Last data filed at 02/01/14 0600  Gross per  24 hour  Intake    725 ml  Output   1410 ml  Net   -685 ml    PHYSICAL EXAMINATION: General:  Elderly female on trach collar, and panda tube, calm, cooperative, lying in bed Neuro:  RASS 0, moves all 4s, interactive HEENT:  [place]/AT, trach in place, normal trach site, No JVD noted Cardiovascular:  RRR, 3/6 systolic murmur heard best at left sternal border Lungs: CTA bilaterally, breaths non-labored Abdomen:  Soft, non-tender, non-distended Musculoskeletal:  No acute deformity or edema.  Skin:  Grossly intact  LABS: PULMONARY  Recent Labs Lab 01/30/14 1207  PHART 7.452*  PCO2ART 53.6*  PO2ART 291.0*  HCO3 36.9*  TCO2 38.5  O2SAT 99.9    CBC  Recent Labs Lab 01/25/14 0702 01/27/14 0915 01/31/14 0900  HGB 10.2* 12.1 10.7*  HCT 31.8* 36.9 33.4*  WBC 9.1 12.2* 14.2*  PLT 184 263 237    COAGULATION  Recent Labs Lab 01/25/14 0702  INR 1.12    CARDIAC    Recent Labs Lab 01/30/14 1840 01/30/14 2100 01/31/14 0540  TROPONINI 3.50* 3.13* 1.91*   No results for input(s): PROBNP in the last 168 hours.   CHEMISTRY  Recent Labs Lab 01/25/14 0702 01/27/14 0305 01/28/14 0327 01/29/14 0329 01/30/14 0323 01/31/14 0900  NA 139 144 142 139 142 142  K 3.9 3.8 3.6 3.8 3.9 3.5  CL 112 112 103 98 100 104  CO2 23 28 34* 31 39* 31  GLUCOSE 169* 114* 126* 131* 110* 123*  BUN 26* 24* 22 33* 30*  35*  CREATININE 0.60 0.55 0.61 0.67 0.72 0.77  CALCIUM 7.3* 7.9* 8.2* 8.4 9.3 9.0  MG 2.4 2.3 2.2  --   --   --   PHOS 1.8* 1.9* 2.0*  --   --   --    Estimated Creatinine Clearance: 40.7 mL/min (by C-G formula based on Cr of 0.77).   LIVER  Recent Labs Lab 01/25/14 0702 01/30/14 0323 01/31/14 0900  AST  --  26 40*  ALT  --  30 36*  ALKPHOS  --  75 68  BILITOT  --  0.7 0.8  PROT  --  5.0* 5.4*  ALBUMIN  --  2.5* 2.7*  INR 1.12  --   --      INFECTIOUS No results for input(s): LATICACIDVEN, PROCALCITON in the last 168 hours.   ENDOCRINE CBG (last 3)    Recent Labs  01/31/14 0013 01/31/14 0307 01/31/14 0753  GLUCAP 89 106* 126*      IMAGING x48h Dg Chest Port 1 View  01/31/2014   CLINICAL DATA:  Aspiration pneumonia.  EXAM: PORTABLE CHEST - 1 VIEW  COMPARISON:  Chest radiograph 1 day prior.  FINDINGS: Tracheostomy tube remains at the thoracic inlet. Weighted enteric tube is seen, the previous coiling in the region of gastroesophageal junction is no longer present. The tip remains in the region of the stomach. Slight decrease in cardiomegaly. Small bilateral pleural effusions are again seen, not significantly changed. Slight decrease in pulmonary edema.  IMPRESSION: 1. Coiling of the enteric tube in the region of the gastroesophageal junction on prior exam is no longer seen. The tip remains in the stomach. 2. Persistent but improving congestive heart failure.   Electronically Signed   By: Jeb Levering M.D.   On: 01/31/2014 01:26   Portable Chest Xray  01/30/2014   CLINICAL DATA:  81 year old female with acute respiratory failure.  EXAM: PORTABLE CHEST - 1 VIEW  COMPARISON:  Chest x-ray 01/30/2014  FINDINGS: A tracheostomy tube is in place with tip 5.1 cm above the carina. Metallic tipped feeding tube present in the stomach, with potential knot in the distal aspect of the feeding tube. Transcutaneous defibrillator pad seen projecting over the upper abdomen and lower left hemithorax. Lung volumes are low. Insert mild edema. Small bilateral pleural effusions (right greater than left). Mild cardiomegaly. The patient is rotated to the right on today's exam, resulting in distortion of the mediastinal contours and reduced diagnostic sensitivity and specificity for mediastinal pathology. Atherosclerosis in the thoracic aorta.  IMPRESSION: 1. Support apparatus, as above. In addition to slight retraction of the feeding tube, the feeding tube now appears coiled and likely knotted in the proximal stomach. 2. The appearance the chest suggests congestive  heart failure, as above. 3. Atherosclerosis. These results will be called to the ordering clinician or representative by the Radiologist Assistant, and communication documented in the PACS or zVision Dashboard.   Electronically Signed   By: Vinnie Langton M.D.   On: 01/30/2014 13:19   Dg Abd Portable 1v  01/31/2014   CLINICAL DATA:  Assess feeding tube placement. Subsequent encounter.  EXAM: PORTABLE ABDOMEN - 1 VIEW  COMPARISON:  Abdominal radiograph performed earlier today at 9:36 p.m.  FINDINGS: The feeding tube is now seen ending overlying the gastric fundus. On correlation with subsequent chest radiograph, this does extend along the course of the distal esophagus.  The visualized bowel gas pattern is grossly unremarkable. No free intra-abdominal air is seen, though evaluation for free air  is limited on a single supine view.  No acute osseous abnormalities are identified. There is mild axial joint space narrowing at the right hip.  IMPRESSION: Feeding tube noted ending overlying the gastric fundus.   Electronically Signed   By: Garald Balding M.D.   On: 01/31/2014 02:00   Dg Abd Portable 1v  01/31/2014   CLINICAL DATA:  Feeding tube placement.  EXAM: PORTABLE ABDOMEN - 1 VIEW  COMPARISON:  Abdominal radiograph performed 01/27/2014  FINDINGS: The patient's feeding tube is noted ending overlying the right lung base.  The visualized bowel gas pattern is grossly unremarkable. No free intra-abdominal air is seen, though evaluation is limited on a single supine view. Left basilar airspace opacification may reflect atelectasis or possibly pneumonia.  No acute osseous abnormalities are seen. There is mild axial joint space narrowing at the right hip.  IMPRESSION: 1. Feeding tube noted ending overlying the right lung base. This was subsequently removed and readvanced, shortly after the study was performed. 2. Left basilar airspace opacification may reflect atelectasis or possibly pneumonia.   Electronically  Signed   By: Garald Balding M.D.   On: 01/31/2014 01:47    ASSESSMENT / PLAN:  PULMONARY OETT 12/30>01/21/14, 01/22/14 >> 01/25/14 trached 01/25/14 due to several reintubations 2/2 to airway edema/injury  A: BaselineHx of COPD NOS - AFL on spiro - follows with [facility]  ? Baseline CXR July 2015 with ?? ILD change but CT 01/20/14 showed onl;y PNA. No ILD Acute resp failure - improving.  Upper airway trauma /edema  On trach collar now since 01/25/14. Back on vent for cardioversion 1/10 On trach collar without vent since 1/11 P:   On trach collar, tolerating well   CARDIOVASCULAR A:  Was hypotensive, now normal. Baseline MAP likely low. BNP elevation - 06/2012 echo with LVEF 60-65% AF w/ RVR and shock- resolved NSTEMI P:  Cardioverted 1/10- NSR Continue to hold Lasix Continue Metoprolol 12.5 mg BID Cardiology consulted>> recommend antiarrhythmic if has reoccurrence of AFib. Anticoagulation not recommended, may complicate clinical course with trach.  RENAL A:   Severe low phos improving with repletion- resolved P:   Hold Lasix bmet intermittently    GASTROINTESTINAL A:   H/o GERD P:   SUP: IV pepcid Cont TF through panda tube.  SLP deferring swallow eval for now, continue TFs Avoid peg  HEMATOLOGIC A:   dvt prevention Some leukocytosis - likely steroid induced.  P:  Lovenox for VTE ppx PT to ambulate  INFECTIOUS  A:   Leukocytosis still going on. Likely steroid induced now. CAP vs aspiration - PCT profile c/w localized infection. CT showed pneumonia  - admit trach aspirate with GPC in pairs repeat trach aspirate sent 01/21/14 due to tan color - normal flora  - currently afebrile. Was treated with abx with levaquin until 01/26/14.   P:   Abx: Ceftriaxone 12/30 >1231 Abx: Azithromycin 12/30 >12/312 IV levaquin 01/21/14 >> stopped 1/6.   Afebrile.  Foley cath d/c'd 1/11  ENDOCRINE  A: Minimal hyperglycemia  P:   Monitor on bmet  NEUROLOGIC A:  metabolic  encephalopathy at night - resolved P:   RASS goal: -1 to 0 wua Active PT / OT  FAMILY  - Updates: Niece (HCPOA) and friend updated ad at admit 01/19/2014. Patient updated 01/22/2014.  Niece updated and patient 01/23/14  Transfer to Step down unit today.  Albin Felling, MD Internal Medicine Resident, PGY-1  PCCM ATTENDING: I have reviewed pt's initial presentation, consultants notes and hospital database  in detail.  The above assessment and plan was formulated under my direction.   Merton Border, MD;  PCCM service; Mobile [phone]

## 2014-02-01 NOTE — Progress Notes (Signed)
Physical Therapy Treatment Patient Details Name: JEWELDEAN DROHAN MRN: [id number] DOB: 02-27-1926 Today's Date: 02/01/2014    History of Present Illness 81 year old female with past medical history that includes COPD , hypertension, skin cancer, and GERD. She presented to [facility] emergency department 01/19/2014 with respiratory distress. Earlier that day she was in her attic cleaning was found by her family 15 minutes later, she was having trouble breathing and her lips were noted to be blue. ETT 12/30-1/1, 1/2-1/3 reintubated 1/3. Tracheostomy 1/5.1/10 pt with Afib s/p cardioversion and back on vent til 1/11    PT Comments    Pt continues progression with gait and mobility daily. Pt on bedpan on arrival able to roll mod I, assist for pericare and encouraged to request BSC or toilet from now on. Pt able to walk 33m unit with one seated rest and sats maintained in high 90s throughout. Too fatigued end of session for HEP but encouraged to perform on her own later. Will continue to follow.   Follow Up Recommendations  LTACH;Supervision for mobility/OOB     Equipment Recommendations  Rolling walker with 5" wheels    Recommendations for Other Services       Precautions / Restrictions Precautions Precautions: Fall Precaution Comments: trach, panda    Mobility  Bed Mobility Overal bed mobility: Needs Assistance Bed Mobility: Supine to Sit     Supine to sit: Supervision     General bed mobility comments: supervision for lines with cues to scoot to EOB  Transfers Overall transfer level: Needs assistance   Transfers: Sit to/from Stand Sit to Stand: Supervision         General transfer comment: cues for hand placement with assist to manage lines x 2 trials  Ambulation/Gait Ambulation/Gait assistance: Min guard Ambulation Distance (Feet): 80 Feet (x 2 trials with seated rest between trials) Assistive device: Rolling walker (2 wheeled) Gait Pattern/deviations: Step-through  pattern;Decreased stride length   Gait velocity interpretation: Below normal speed for age/gender General Gait Details: cues for posture with fatigue limiting distance, pt bumped to 40% venturi for gait , sats remained 96-99% throughout.    Stairs            Wheelchair Mobility    Modified Rankin (Stroke Patients Only)       Balance Overall balance assessment: Needs assistance   Sitting balance-Leahy Scale: Good       Standing balance-Leahy Scale: Fair                      Cognition Arousal/Alertness: Awake/alert Behavior During Therapy: WFL for tasks assessed/performed Overall Cognitive Status: Within Functional Limits for tasks assessed                      Exercises      General Comments        Pertinent Vitals/Pain Pain Assessment: No/denies pain  HR 100-105 sats 97-99% on 40% FiO2 trach collar with gait At rest on 28% FiO2 97%    Home Living                      Prior Function            PT Goals (current goals can now be found in the care plan section) Progress towards PT goals: Progressing toward goals    Frequency       PT Plan Current plan remains appropriate    Co-evaluation  End of Session Equipment Utilized During Treatment: Oxygen Activity Tolerance: Patient tolerated treatment well Patient left: in chair;with call bell/phone within reach     Time: 0745-0810 PT Time Calculation (min) (ACUTE ONLY): 25 min  Charges:  $Gait Training: 23-37 mins                    G Codes:      Melford Aase 2014-02-13, 8:15 AM Elwyn Reach, [place]

## 2014-02-01 NOTE — Progress Notes (Signed)
Patient Name: Tanya Butler Date of Encounter: 02/01/2014     Active Problems:   Acute respiratory failure   Acute respiratory distress   Encounter for feeding tube placement   Encounter for orogastric (OG) tube placement   Respiratory failure   Endotracheal tube present   ILD (interstitial lung disease)   Encounter for nasogastric (NG) tube placement   Valvular heart disease   PAF (paroxysmal atrial fibrillation)   Atrial fibrillation with RVR    SUBJECTIVE  The patient states that she is feeling better.  At the present time she is sitting up in the chair in no distress.  Rhythm remains normal sinus rhythm with occasional premature atrial contraction.  No further atrial fibrillation.  Blood pressure is soft but she is asymptomatic.  CURRENT MEDS . antiseptic oral rinse  7 mL Mouth Rinse QID  . aspirin  325 mg Per Tube Daily  . budesonide  0.25 mg Nebulization BID  . chlorhexidine  15 mL Mouth Rinse BID  . enoxaparin (LOVENOX) injection  40 mg Subcutaneous Q24H  . famotidine  20 mg Per Tube Daily  . ipratropium-albuterol  3 mL Nebulization TID  . metoprolol tartrate  12.5 mg Per Tube BID  . sodium chloride  750 mL Intravenous Once    OBJECTIVE  Filed Vitals:   02/01/14 1000 02/01/14 1026 02/01/14 1100 02/01/14 1200  BP: 107/45 107/45 81/38 81/38   Pulse: 97 98  80  Temp:      TempSrc: Oral  Oral   Resp: 30  6 28   Height:      Weight:      SpO2: 97%   99%    Intake/Output Summary (Last 24 hours) at 02/01/14 1245 Last data filed at 02/01/14 1100  Gross per 24 hour  Intake    930 ml  Output    585 ml  Net    345 ml   Filed Weights   01/30/14 0428 01/31/14 0500 02/01/14 0500  Weight: 140 lb 3.4 oz (63.6 kg) 128 lb 12 oz (58.4 kg) 128 lb 1.4 oz (58.1 kg)    PHYSICAL EXAM  General: Pleasant, NAD. Neuro: Alert.  Not certain head appropriately to questions.  Moves all extremities spontaneously. Psych: Normal affect. HEENT:  Normal.  Recent tracheostomy  in place.  Neck: Supple without bruits or JVD. Lungs:  Resp regular and unlabored, moderate expiratory wheezing.  No respiratory distress Heart: RRR no s3, s4, or murmurs. Abdomen: Soft, non-tender, non-distended, BS + x 4.  Extremities: No clubbing, cyanosis or edema. DP/PT/Radials 2+ and equal bilaterally.  Accessory Clinical Findings  CBC  Recent Labs  01/31/14 0900  WBC 14.2*  HGB 10.7*  HCT 33.4*  MCV 94.6  PLT 956   Basic Metabolic Panel  Recent Labs  01/30/14 0323 01/31/14 0900  NA 142 142  K 3.9 3.5  CL 100 104  CO2 39* 31  GLUCOSE 110* 123*  BUN 30* 35*  CREATININE 0.72 0.77  CALCIUM 9.3 9.0   Liver Function Tests  Recent Labs  01/30/14 0323 01/31/14 0900  AST 26 40*  ALT 30 36*  ALKPHOS 75 68  BILITOT 0.7 0.8  PROT 5.0* 5.4*  ALBUMIN 2.5* 2.7*   No results for input(s): LIPASE, AMYLASE in the last 72 hours. Cardiac Enzymes  Recent Labs  01/30/14 1840 01/30/14 2100 01/31/14 0540  TROPONINI 3.50* 3.13* 1.91*   BNP Invalid input(s): POCBNP D-Dimer No results for input(s): DDIMER in the last 72 hours. Hemoglobin A1C No  results for input(s): HGBA1C in the last 72 hours. Fasting Lipid Panel No results for input(s): CHOL, HDL, LDLCALC, TRIG, CHOLHDL, LDLDIRECT in the last 72 hours. Thyroid Function Tests No results for input(s): TSH, T4TOTAL, T3FREE, THYROIDAB in the last 72 hours.  Invalid input(s): FREET3  TELE  Normal sinus rhythm with occasional PAC.  No recurrent atrial fibrillation  ECG  Personally reviewed.31-Jan-2014 09:57:48 [facility] ROUTINE RECORD Sinus tachycardia Moderate voltage criteria for LVH, may be normal variant Possible Inferior infarct , age undetermined Abnormal ECG No significant change since last tracing Confirmed by Tamala Julian MD, [place] [phone]) on 01/31/2014 1:46:49 PM  Radiology/Studies  Dg Chest 2 View  01/30/2014   CLINICAL DATA:  COPD exacerbation  EXAM: CHEST  2 VIEW  COMPARISON:   01/28/2014  FINDINGS: Cardiomegaly with pulmonary vascular congestion. Possible mild right perihilar edema.  Mild patchy bilateral lower lobe opacities, likely atelectasis. No pneumothorax.  Tracheostomy in satisfactory position.  Enteric tube coursing into the stomach.  IMPRESSION: Cardiomegaly with pulmonary vascular congestion and possible mild right perihilar edema.  Patchy bilateral lower lobe opacities, likely atelectasis.   Electronically Signed   By: Julian Hy M.D.   On: 01/30/2014 07:30   Ct Chest High Resolution  01/20/2014   CLINICAL DATA:  81 year old female with history of acute respiratory failure. Evaluate for interstitial lung disease.  EXAM: CT CHEST WITHOUT CONTRAST  TECHNIQUE: Multidetector CT imaging of the chest was performed following the standard protocol without intravenous contrast. High resolution imaging of the lungs, as well as inspiratory and expiratory imaging, was performed.  COMPARISON:  No priors.  FINDINGS: Mediastinum: Heart size is mildly enlarged. There is no significant pericardial fluid, thickening or pericardial calcification. There is atherosclerosis of the thoracic aorta, the great vessels of the mediastinum and the coronary arteries, including calcified atherosclerotic plaque in the left main, left anterior descending and right coronary arteries. Calcifications of the aortic valve and mitral annulus. Multiple borderline enlarged mediastinal and hilar lymph nodes, presumably reactive. Esophagus is unremarkable in appearance. Patient is intubated, with the tip of the endotracheal tube less than 1 cm above the level of the carina. Nasogastric tube extending at least of the stomach (tube extends below the lower margin of the images).  Lungs/Pleura: High-resolution images are limited by patient motion. There does appear to be relatively diffuse ground-glass attenuation with some interlobular septal thickening, favored to reflect a background of mild interstitial  pulmonary edema. Given the presence of presumed edema, accurate assessment for interstitial lung disease is limited. With these limitations in mind, no definite areas of subpleural reticulation, parenchymal banding, traction bronchiectasis or frank honeycombing are appreciated. Inspiratory and expiratory imaging appears to demonstrates a mild air trapping, suggesting small airways disease. In addition, there is a focal area of airspace consolidation with some air bronchograms in the right lower lobe, presumably an acute pneumonia. Small bilateral pleural effusions layering dependently.  Upper Abdomen: Small calcifications in the liver, presumably calcified granulomas.  Musculoskeletal: Old compression fracture of T9 with approximately 70% loss of anterior vertebral body height and 25% loss of posterior vertebral body height, unchanged compared to prior chest x-ray 07/21/2012. There are no aggressive appearing lytic or blastic lesions noted in the visualized portions of the skeleton.  IMPRESSION: 1. Despite the mild limitations of today's examination, there is no definitive evidence to suggest interstitial lung disease at this time. 2. Right lower lobe airspace consolidation and air bronchograms concerning for right lower lobe pneumonia. 3. In addition, there is  mild cardiomegaly with small bilateral pleural effusions and a background of mild interstitial pulmonary edema; imaging findings suggestive of underlying congestive heart failure. 4. Support apparatus, as above. Endotracheal tube is in a low position less than 1 cm above the carina. Consider withdrawal 2-3 cm for more optimal placement. 5. Atherosclerosis, including left main and 2 vessel coronary artery disease. These results will be called to the ordering clinician or representative by the Radiologist Assistant, and communication documented in the PACS or zVision Dashboard.   Electronically Signed   By: Vinnie Langton M.D.   On: 01/20/2014 15:27   Dg  Chest Port 1 View  01/31/2014   CLINICAL DATA:  Aspiration pneumonia.  EXAM: PORTABLE CHEST - 1 VIEW  COMPARISON:  Chest radiograph 1 day prior.  FINDINGS: Tracheostomy tube remains at the thoracic inlet. Weighted enteric tube is seen, the previous coiling in the region of gastroesophageal junction is no longer present. The tip remains in the region of the stomach. Slight decrease in cardiomegaly. Small bilateral pleural effusions are again seen, not significantly changed. Slight decrease in pulmonary edema.  IMPRESSION: 1. Coiling of the enteric tube in the region of the gastroesophageal junction on prior exam is no longer seen. The tip remains in the stomach. 2. Persistent but improving congestive heart failure.   Electronically Signed   By: Jeb Levering M.D.   On: 01/31/2014 01:26   Portable Chest Xray  01/30/2014   CLINICAL DATA:  81 year old female with acute respiratory failure.  EXAM: PORTABLE CHEST - 1 VIEW  COMPARISON:  Chest x-ray 01/30/2014  FINDINGS: A tracheostomy tube is in place with tip 5.1 cm above the carina. Metallic tipped feeding tube present in the stomach, with potential knot in the distal aspect of the feeding tube. Transcutaneous defibrillator pad seen projecting over the upper abdomen and lower left hemithorax. Lung volumes are low. Insert mild edema. Small bilateral pleural effusions (right greater than left). Mild cardiomegaly. The patient is rotated to the right on today's exam, resulting in distortion of the mediastinal contours and reduced diagnostic sensitivity and specificity for mediastinal pathology. Atherosclerosis in the thoracic aorta.  IMPRESSION: 1. Support apparatus, as above. In addition to slight retraction of the feeding tube, the feeding tube now appears coiled and likely knotted in the proximal stomach. 2. The appearance the chest suggests congestive heart failure, as above. 3. Atherosclerosis. These results will be called to the ordering clinician or  representative by the Radiologist Assistant, and communication documented in the PACS or zVision Dashboard.   Electronically Signed   By: Vinnie Langton M.D.   On: 01/30/2014 13:19   Dg Chest Port 1 View  01/28/2014   CLINICAL DATA:  Shortness of breath  EXAM: PORTABLE CHEST - 1 VIEW  COMPARISON:  01/27/2014  FINDINGS: Cardiac shadow remains enlarged. A feeding catheter and tracheostomy tube are again seen and stable. Small bilateral pleural effusions are noted right greater than left with mild basilar atelectasis. These have increased slightly in the interval from the prior exam.  IMPRESSION: Bilateral pleural effusions and basilar atelectasis. The overall appearance has worsened slightly in the right base when compare with the prior exam.   Electronically Signed   By: Inez Catalina M.D.   On: 01/28/2014 07:26   Dg Chest Port 1 View  01/27/2014   CLINICAL DATA:  81 year old female with acute respiratory failure, shortness of breath, fluid overload. Initial encounter.  EXAM: PORTABLE CHEST - 1 VIEW  COMPARISON:  01/25/2014 and earlier.  FINDINGS:  Portable AP semi upright view at 0729 hr. Tracheostomy tube again projects in midline. There are then, 2 new tubes also projecting over midline and seem to course into the left upper quadrant.  Stable lung volumes. Mildly decreased pulmonary vascular congestion. Veiling basilar and retrocardiac opacity suggesting pleural effusions superimposed on lower lobe atelectasis. No pneumothorax.  IMPRESSION: 1. Stable tracheostomy tube. Query interval placement of 2 enteric tubes, also projecting over midline. 2. Regressed but not resolved pulmonary edema. Pleural effusions and lower lobe atelectasis suspected.   Electronically Signed   By: Lars Pinks M.D.   On: 01/27/2014 07:52   Dg Chest Port 1 View  01/25/2014   CLINICAL DATA:  Shortness of breath, tracheostomy placement  EXAM: PORTABLE CHEST - 1 VIEW  COMPARISON:  01/24/2014  FINDINGS: Borderline cardiomegaly. Endotracheal  and NG tube has been removed. Tracheostomy tube in place. Slight worsening in aeration with mild interstitial prominence bilaterally suspicious for interstitial edema. Small right pleural effusion with right basilar atelectasis or infiltrate. Trace left pleural effusion with left basilar atelectasis.  IMPRESSION: Tracheostomy tube in place. Slight worsening in aeration with mild interstitial prominence bilaterally suspicious for interstitial edema. Small right pleural effusion with right basilar atelectasis or infiltrate. Trace left pleural effusion with left basilar atelectasis. No pneumothorax.   Electronically Signed   By: Lahoma Crocker M.D.   On: 01/25/2014 15:16   Dg Chest Port 1 View  01/24/2014   CLINICAL DATA:  Check endotracheal tube, shortness of Breath  EXAM: PORTABLE CHEST - 1 VIEW  COMPARISON:  01/22/2014  FINDINGS: Cardiac shadow is stable. Endotracheal tube is seen 2 cm above the carinal. This is relatively stable in appearance from the prior exam. Nasogastric catheter is seen within the stomach. Increasing right-sided effusion is noted. Increasing left retrocardiac density is also seen.  IMPRESSION: Tubes and lines as described.  Increasing bibasilar changes with effusion on the right and left lower lobe atelectasis.   Electronically Signed   By: Inez Catalina M.D.   On: 01/24/2014 07:47   Dg Chest Port 1 View  01/22/2014   CLINICAL DATA:  Re-evaluation of endotracheal tube positioning after retracting endotracheal tube  EXAM: PORTABLE CHEST - 1 VIEW  COMPARISON:  January 22, 2014 at 6:43 a.m.  FINDINGS: Endotracheal tube now identified with tip 19 mm above the carina. No change in NG tube. Stable mild cardiac enlargement and pulmonary vascular congestion.  IMPRESSION: Endotracheal tube tip 19 mm above the carina.   Electronically Signed   By: Skipper Cliche M.D.   On: 01/22/2014 10:41   Dg Chest Port 1 View  01/22/2014   CLINICAL DATA:  Respiratory failure.  EXAM: PORTABLE CHEST - 1 VIEW   COMPARISON:  01/21/2014  FINDINGS: Endotracheal tube tip is advanced to the origin of the right mainstem bronchus. The tip measures about 4 cm below the desired position above the carina. Enteric tube present and coiled in the left upper quadrant consistent with location in the stomach. Mild cardiac enlargement and pulmonary vascular congestion. No definite edema or consolidation. No pneumothorax.  IMPRESSION: Endotracheal tube tip is advanced into the right mainstem bronchus, bowel 4 cm below the expected location above the carina.  These results were called by telephone at the time of interpretation on 01/22/2014 at 6:59 am to [facility], ICU nurse, who verbally acknowledged these results.   Electronically Signed   By: Lucienne Capers M.D.   On: 01/22/2014 07:01   Dg Chest Port 1 View  01/21/2014  CLINICAL DATA:  Interstitial lung disease, acute respiratory failure. History of hypertension and COPD.  EXAM: PORTABLE CHEST - 1 VIEW  COMPARISON:  01/20/2014  FINDINGS: Support devices are in stable position. Mild cardiomegaly with vascular congestion. Further improvement in interstitial opacities, particularly in the right lung, likely improving edema. Mild patchy bilateral infrahilar opacities persist. No effusions.  IMPRESSION: Improving interstitial opacities with mild residual bilateral perihilar and infrahilar opacities.   Electronically Signed   By: Rolm Baptise M.D.   On: 01/21/2014 09:23   Dg Chest Port 1 View  01/20/2014   CLINICAL DATA:  Re-evaluate airspace disease, acute respiratory failure  EXAM: PORTABLE CHEST - 1 VIEW  COMPARISON:  Portable chest x-ray of January 19, 2014  FINDINGS: The lungs are hypoinflated. The endotracheal tube tip lies 1.8 cm above the crotch of the carina. The pulmonary interstitial markings are less conspicuous today but remain increased. The cardiopericardial silhouette is top-normal in size. The pulmonary vascularity is less engorged. The esophagogastric tube project  below the inferior margin of the image.  IMPRESSION: There has been slight interval improvement in the appearance of the pulmonary interstitium which may reflect some resolving interstitial edema. The endotracheal tube tip lies 1.8 cm above the crotch of the carina. Withdrawal by 1 2 cm is recommended to avoid accidental mainstem bronchus intubation.   Electronically Signed   By: David  Martinique   On: 01/20/2014 07:37   Dg Chest Portable 1 View  01/19/2014   CLINICAL DATA:  Shortness of breath. History of COPD and hypertension.  EXAM: PORTABLE CHEST - 1 VIEW  COMPARISON:  07/21/2012  FINDINGS: Endotracheal tube is in place. The tip is 2 cm above the carina. NG tube is in the stomach.  Heart is borderline in size. Patchy bilateral airspace disease, right greater than left. This could represent asymmetric edema or infection. No effusions. No acute bony abnormality.  IMPRESSION: Patchy bilateral airspace disease, right greater than left. This could represent asymmetric edema or infection.   Electronically Signed   By: Rolm Baptise M.D.   On: 01/19/2014 16:42   Dg Abd Portable 1v  01/31/2014   CLINICAL DATA:  Assess feeding tube placement. Subsequent encounter.  EXAM: PORTABLE ABDOMEN - 1 VIEW  COMPARISON:  Abdominal radiograph performed earlier today at 9:36 p.m.  FINDINGS: The feeding tube is now seen ending overlying the gastric fundus. On correlation with subsequent chest radiograph, this does extend along the course of the distal esophagus.  The visualized bowel gas pattern is grossly unremarkable. No free intra-abdominal air is seen, though evaluation for free air is limited on a single supine view.  No acute osseous abnormalities are identified. There is mild axial joint space narrowing at the right hip.  IMPRESSION: Feeding tube noted ending overlying the gastric fundus.   Electronically Signed   By: Garald Balding M.D.   On: 01/31/2014 02:00   Dg Abd Portable 1v  01/31/2014   CLINICAL DATA:  Feeding  tube placement.  EXAM: PORTABLE ABDOMEN - 1 VIEW  COMPARISON:  Abdominal radiograph performed 01/27/2014  FINDINGS: The patient's feeding tube is noted ending overlying the right lung base.  The visualized bowel gas pattern is grossly unremarkable. No free intra-abdominal air is seen, though evaluation is limited on a single supine view. Left basilar airspace opacification may reflect atelectasis or possibly pneumonia.  No acute osseous abnormalities are seen. There is mild axial joint space narrowing at the right hip.  IMPRESSION: 1. Feeding tube noted ending overlying the  right lung base. This was subsequently removed and readvanced, shortly after the study was performed. 2. Left basilar airspace opacification may reflect atelectasis or possibly pneumonia.   Electronically Signed   By: Garald Balding M.D.   On: 01/31/2014 01:47   Dg Abd Portable 1v  01/27/2014   CLINICAL DATA:  Tube placement.  EXAM: PORTABLE ABDOMEN - 1 VIEW  COMPARISON:  01/26/2014.  FINDINGS: Feeding tube again noted projected over the distal stomach. Gas pattern is nonspecific. No free air identified. Cardiomegaly. Left lower lobe atelectasis and/or infiltrate.Left pleural effusion.  IMPRESSION: 1. Feeding tube in stable position. 2. Left lower lobe atelectasis and/or infiltrate with left pleural effusion. 3. Cardiomegaly.   Electronically Signed   By: Marcello Moores  Register   On: 01/27/2014 07:36   Dg Abd Portable 1v  01/26/2014   CLINICAL DATA:  Encounter for nasogastric tube placement.  EXAM: PORTABLE ABDOMEN - 1 VIEW  COMPARISON:  January 24, 2014.  FINDINGS: The bowel gas pattern is normal. Distal tip of feeding tube is seen in the expected position of the distal stomach. No radio-opaque calculi or other significant radiographic abnormality are seen.  IMPRESSION: Distal tip of feeding tube seen in expected position of distal stomach.   Electronically Signed   By: Sabino Dick M.D.   On: 01/26/2014 07:35   Dg Abd Portable 1v  01/24/2014    CLINICAL DATA:  81 year old female with enteric tube placement. Initial encounter. Current history of acute respiratory failure.  EXAM: PORTABLE ABDOMEN - 1 VIEW  COMPARISON:  01/22/2014 and earlier.  FINDINGS: Portable AP view at 0509 hrs. Enteric tube re - identified, and the tip has advanced just across midline projecting in the lower abdomen. The side hole projects just to the left of the spine. Interval decreased bowel gas, non obstructed pattern. Mildly increased retrocardiac opacity. No definite pneumoperitoneum on this single image.  IMPRESSION: 1. Enteric tube tip has advanced just across midline in the lower abdomen compatible with antral placement within a ptotic stomach. Side hole at the level of the distal gastric body. 2. Non obstructed bowel gas pattern. 3. Increased streaky left lung base opacity.   Electronically Signed   By: Lars Pinks M.D.   On: 01/24/2014 07:51   Dg Abd Portable 1v  01/22/2014   CLINICAL DATA:  Nasogastric tube placement at bedside.  EXAM: PORTABLE ABDOMEN - 1 VIEW  COMPARISON:  None.  FINDINGS: Nasogastric tube looped in the stomach with its tip in the mid to distal body. Bowel gas pattern unremarkable without evidence of obstruction or significant ileus. External cardiac pacing pads overlie the left upper abdomen.  IMPRESSION: 1. Nasogastric tube looped in the stomach with its tip in the mid to distal body of the stomach. 2. No acute abdominal abnormality.   Electronically Signed   By: Evangeline Dakin M.D.   On: 01/22/2014 07:58    ASSESSMENT AND PLAN  1.  Paroxysmal atrial fibrillation.  Chadsvasc score is 4 for age greater than 39, and female sex, and hypertension. However not a good candidate for full anticoagulation at this point because of recent trach etc. Continue aspirin. Maintaining normal sinus rhythm on low dose metoprolol. 2.  Respiratory failure, per CCM  Plan: Continue current medication.  Signed, Darlin Coco MD

## 2014-02-01 NOTE — Procedures (Signed)
Objective Swallowing Evaluation: Fiberoptic Endoscopic Evaluation of Swallowing  Patient Details  Name: Tanya Butler MRN: [id number] Date of Birth: [birth year]  Today's Date: 02/01/2014 Time: 3299-2426 SLP Time Calculation (min) (ACUTE ONLY): 34 min  Past Medical History:  Past Medical History  Diagnosis Date  . HTN (hypertension)   . Hyperlipidemia   . Restless leg syndrome   . Osteoporosis   . GERD (gastroesophageal reflux disease)   . COPD (chronic obstructive pulmonary disease)   . Hypertonicity of bladder   . Skin cancer    Past Surgical History:  Past Surgical History  Procedure Laterality Date  . Skin cancer excision    . Esophagogastroduodenoscopy N/A 07/15/2012    Procedure: ESOPHAGOGASTRODUODENOSCOPY (EGD);  Surgeon: Wonda Horner, MD;  Location: [facility] ENDOSCOPY;  Service: Endoscopy;  Laterality: N/A;  . Esophagogastroduodenoscopy N/A 07/22/2012    Procedure: ESOPHAGOGASTRODUODENOSCOPY (EGD);  Surgeon: Missy Sabins, MD;  Location: [facility] ENDOSCOPY;  Service: Endoscopy;  Laterality: N/A;   HPI:  81 year old female with past medical history as outlined below, which includes COPD (RB patient), hypertension, skin cancer, and GERD. She presented to [facility] emergency department 01/19/2014 with respiratory distress. Pt was intubated 12/30-1/1, 1/2-1/3, and again 1/3 until she received trach on 1/5. Pt currently with #6 cuffed Shiley and initiated TCT 1/6.     Assessment / Plan / Recommendation Clinical Impression  Dysphagia Diagnosis: Moderate pharyngeal phase dysphagia  Clinical impression: Pt has a moderate sensorimotor pharyngeal dysphagia, with PMSV in place throughout study. Swallow initiation overall is timely with some premature spillage noted only with straw sips of nectar thick liquids. Pt has generalized weakness and swalling, particularly of the arytenoids, which impacts efficiency of swallow and results in silent penetration of thin liquids as well as moderate residuals  post-swallow with all consistencies. Residual material is consistently cleared from entering the vestibule with a reflexive swallow. Recommend to initiate Dys 2 diet and nectar thick liquids. Prognosis for advancement is good with additional time post-intubation and increase in overall strength.    Treatment Recommendation  Therapy as outlined in treatment plan below    Diet Recommendation Dysphagia 2 (Fine chop);Nectar-thick liquid   Liquid Administration via: Cup;No straw Medication Administration: Whole meds with puree Supervision: Patient able to self feed;Full supervision/cueing for compensatory strategies Compensations: Slow rate;Small sips/bites;Multiple dry swallows after each bite/sip Postural Changes and/or Swallow Maneuvers: Seated upright 90 degrees;Upright 30-60 min after meal    Other  Recommendations Oral Care Recommendations: Oral care BID Other Recommendations: Order thickener from pharmacy;Prohibited food (jello, ice cream, thin soups);Remove water pitcher   Follow Up Recommendations  LTACH    Frequency and Duration min 2x/week  2 weeks   Pertinent Vitals/Pain n/a    SLP Swallow Goals     General HPI: 81 year old female with past medical history as outlined below, which includes COPD (RB patient), hypertension, skin cancer, and GERD. She presented to [facility] emergency department 01/19/2014 with respiratory distress. Pt was intubated 12/30-1/1, 1/2-1/3, and again 1/3 until she received trach on 1/5. Pt currently with #6 cuffed Shiley and initiated TCT 1/6. Type of Study: Fiberoptic Endoscopic Evaluation of Swallowing Reason for Referral: Objectively evaluate swallowing function Previous Swallow Assessment: BSE recommending NPO pending objective testing Diet Prior to this Study: NPO;PEG tube Temperature Spikes Noted: No Respiratory Status: Trach collar Trach Size and Type: #6;Deflated;With PMSV in place;Cuff History of Recent Intubation: Yes Length of  Intubations (days): 7 days (across 3 intubations) Date extubated:  (trach 1/5) Behavior/Cognition: Alert;Cooperative;Pleasant mood Oral  Cavity - Dentition: Adequate natural dentition Oral Motor / Sensory Function: Within functional limits Self-Feeding Abilities: Able to feed self Patient Positioning: Upright in chair Baseline Vocal Quality: Wet;Low vocal intensity Volitional Cough: Strong Volitional Swallow:  (difficulty to elicit) Anatomy:  (redness, swelling particularly with the arytenoids) Pharyngeal Secretions: Standing secretions in (comment) (in vestibule, clears with cued cough)    Reason for Referral Objectively evaluate swallowing function   Oral Phase Oral Preparation/Oral Phase Oral Phase: WFL   Pharyngeal Phase Pharyngeal Phase Pharyngeal Phase: Impaired Pharyngeal - Nectar Pharyngeal - Nectar Teaspoon: Reduced pharyngeal peristalsis;Reduced airway/laryngeal closure;Reduced tongue base retraction;Pharyngeal residue - pyriform sinuses;Pharyngeal residue - posterior pharnyx;Pharyngeal residue - cp segment Pharyngeal - Nectar Cup: Reduced pharyngeal peristalsis;Reduced airway/laryngeal closure;Reduced tongue base retraction;Pharyngeal residue - pyriform sinuses;Pharyngeal residue - posterior pharnyx;Pharyngeal residue - cp segment Pharyngeal - Nectar Straw: Reduced pharyngeal peristalsis;Reduced airway/laryngeal closure;Reduced tongue base retraction;Pharyngeal residue - pyriform sinuses;Pharyngeal residue - posterior pharnyx;Pharyngeal residue - cp segment;Premature spillage to pyriform sinuses Pharyngeal - Thin Pharyngeal - Thin Cup: Reduced pharyngeal peristalsis;Reduced airway/laryngeal closure;Reduced tongue base retraction;Pharyngeal residue - pyriform sinuses;Pharyngeal residue - posterior pharnyx;Pharyngeal residue - cp segment;Penetration/Aspiration during swallow Penetration/Aspiration details (thin cup): Material enters airway, remains ABOVE vocal cords and not ejected  out Pharyngeal - Solids Pharyngeal - Puree: Reduced pharyngeal peristalsis;Reduced airway/laryngeal closure;Reduced tongue base retraction;Pharyngeal residue - pyriform sinuses;Pharyngeal residue - posterior pharnyx;Pharyngeal residue - cp segment Pharyngeal - Mechanical Soft: Reduced pharyngeal peristalsis;Reduced airway/laryngeal closure;Reduced tongue base retraction;Pharyngeal residue - pyriform sinuses;Pharyngeal residue - posterior pharnyx;Pharyngeal residue - cp segment  Cervical Esophageal Phase    GO    Cervical Esophageal Phase Cervical Esophageal Phase: [facility]        [name], M.A. CCC-SLP [phone]  [name] 02/01/2014, 2:31 PM

## 2014-02-01 NOTE — Progress Notes (Signed)
Speech Language Pathology Treatment: Dysphagia;[place] Speaking valve  Patient Details Name: Tanya Butler MRN: [id number] DOB: Sep 08, 1926 Today's Date: 02/01/2014 Time: 0258-5277 SLP Time Calculation (min) (ACUTE ONLY): 29 min  Assessment / Plan / Recommendation Clinical Impression  Pt shows increased tolerance with PMSV and PO trials today. Pt wore PMSV for 40 minutes with full faded to intermittent supervision provided by SLP. Voicing was loud and clear, with only mildly reduced breath support. Today, pt is not expectorating her secretions as frequently. Valve was removed when RR became elevated (34).   RR remained stable throughout trials of ice chips and cup sips of thin liquids, with wet vocal quality x1. Pt with delayed throat clear x1 with pureed trials as well. Overall, she is with less signs of dysphagia today as compared to previous dates. Recommend to proceed with FEES this afternoon to determine if pt is safe to initiate POs.   HPI HPI: 81 year old female with past medical history as outlined below, which includes COPD (RB patient), hypertension, skin cancer, and GERD. She presented to [facility] emergency department 01/19/2014 with respiratory distress. Pt was intubated 12/30-1/1, 1/2-1/3, and again 1/3 until she received trach on 1/5. Pt currently with #6 cuffed Shiley and initiated TCT 1/6.   Pertinent Vitals Pain Assessment: No/denies pain  SLP Plan  New goals to be determined pending instrumental study (FEES)    Recommendations Diet recommendations: NPO Medication Administration: Via alternative means      Patient may use Passy-Muir Speech Valve: Intermittently with supervision PMSV Supervision: Full MD: Please consider changing trach tube to : Smaller size;Cuffless       Oral Care Recommendations: Oral care Q4 per protocol Follow up Recommendations: LTACH Plan: New goals to be determined pending instrumental study (FEES)    GO      Tanya Butler, M.A.  CCC-SLP [phone]  Tanya Butler 02/01/2014, 10:06 AM

## 2014-02-02 LAB — BASIC METABOLIC PANEL
Anion gap: 8 (ref 5–15)
BUN: 34 mg/dL — AB (ref 6–23)
CALCIUM: 9.5 mg/dL (ref 8.4–10.5)
CO2: 31 mmol/L (ref 19–32)
Chloride: 101 mEq/L (ref 96–112)
Creatinine, Ser: 0.75 mg/dL (ref 0.50–1.10)
GFR calc Af Amer: 87 mL/min — ABNORMAL LOW (ref >90)
GFR, EST NON AFRICAN AMERICAN: 75 mL/min — AB (ref >90)
GLUCOSE: 137 mg/dL — AB (ref 70–99)
Potassium: 3.3 mmol/L — ABNORMAL LOW (ref 3.5–5.1)
Sodium: 140 mmol/L (ref 135–145)

## 2014-02-02 LAB — CBC WITH DIFFERENTIAL/PLATELET
BASOS ABS: 0 10*3/uL (ref 0.0–0.1)
Basophils Relative: 0 % (ref 0–1)
EOS ABS: 0.2 10*3/uL (ref 0.0–0.7)
Eosinophils Relative: 1 % (ref 0–5)
HCT: 31 % — ABNORMAL LOW (ref 36.0–46.0)
Hemoglobin: 9.9 g/dL — ABNORMAL LOW (ref 12.0–15.0)
Lymphocytes Relative: 6 % — ABNORMAL LOW (ref 12–46)
Lymphs Abs: 0.9 10*3/uL (ref 0.7–4.0)
MCH: 30.7 pg (ref 26.0–34.0)
MCHC: 31.9 g/dL (ref 30.0–36.0)
MCV: 96.3 fL (ref 78.0–100.0)
MONOS PCT: 6 % (ref 3–12)
Monocytes Absolute: 1 10*3/uL (ref 0.1–1.0)
Neutro Abs: 13.3 10*3/uL — ABNORMAL HIGH (ref 1.7–7.7)
Neutrophils Relative %: 87 % — ABNORMAL HIGH (ref 43–77)
PLATELETS: 198 10*3/uL (ref 150–400)
RBC: 3.22 MIL/uL — AB (ref 3.87–5.11)
RDW: 15.6 % — ABNORMAL HIGH (ref 11.5–15.5)
WBC: 15.4 10*3/uL — ABNORMAL HIGH (ref 4.0–10.5)

## 2014-02-02 LAB — PHOSPHORUS: PHOSPHORUS: 3.7 mg/dL (ref 2.3–4.6)

## 2014-02-02 LAB — MAGNESIUM: Magnesium: 2 mg/dL (ref 1.5–2.5)

## 2014-02-02 MED ORDER — POTASSIUM CHLORIDE 10 MEQ/100ML IV SOLN
10.0000 meq | Freq: Once | INTRAVENOUS | Status: AC
  Administered 2014-02-02: 10 meq via INTRAVENOUS

## 2014-02-02 MED ORDER — ENSURE PUDDING PO PUDG
1.0000 | Freq: Three times a day (TID) | ORAL | Status: DC
  Administered 2014-02-02 – 2014-02-03 (×4): 1 via ORAL

## 2014-02-02 MED ORDER — POTASSIUM CHLORIDE 20 MEQ/15ML (10%) PO SOLN
40.0000 meq | Freq: Once | ORAL | Status: AC
  Administered 2014-02-02: 40 meq via ORAL
  Filled 2014-02-02: qty 30

## 2014-02-02 MED ORDER — POTASSIUM CHLORIDE 10 MEQ/100ML IV SOLN
10.0000 meq | INTRAVENOUS | Status: AC
  Administered 2014-02-02 (×2): 10 meq via INTRAVENOUS
  Filled 2014-02-02 (×2): qty 100

## 2014-02-02 NOTE — Progress Notes (Signed)
PULMONARY / CRITICAL CARE MEDICINE   Name: Tanya Butler MRN: [id number] DOB: 09/10/1926    ADMISSION DATE:  01/19/2014 CONSULTATION DATE:  01/19/2014  REFERRING MD :  EDP  CHIEF COMPLAINT:  Respiratory distress  INITIAL PRESENTATION:  81 year old female with past medical history as outlined below, which includes COPD (RB patient), hypertension, skin cancer, and GERD. She presented to [facility] emergency department 01/19/2014 with respiratory distress.  SIGNIFICANT EVENTS: 01/19/2014 -admt 01/20/14 -  Patient  And family denied to RN that there was dust exposure. Patient went upto attic and just got very dyspneic. Failed SBT earlier. CT - with pneumonia. No ILD. RF  And all autoimmune- negative 01/21/14: Extubated. Repeat tracheal aspirate sent - 01/22/2014: reintubated earlier due to agitation - seems to have recurent sun downinng. This AM was hypotensive - Rx neo aftter failing fluid bolus.  01/23/14: extubated but re intubated in the afternoon due to stridor  01/23/14 reintubated after developing stridor. Had swollen arytenoid. 01/24/14: No events.  01/25/14: trached. Reduced steroid to 60mg  BID. 1/6 and 1/7: remained on trach collar. Reduced steroid to 20 daily. 1/10: now in AF w/ RVR HR 200s. Moved back to ICU, Placed back on vent. Cardioverted 1/13 NSR, pending transfer    SUBJECTIVE/OVERNIGHT/INTERVAL HX Pt states she is sob this am   RN dribbling urine, changed intercannula late am 5 am, bloody thick secretions with clots previously noted. Anxiety but not expressing,  Not needed prn Fentanyl.   VITAL SIGNS: Temp:  [97.8 F (36.6 C)-98.4 F (36.9 C)] 98.2 F (36.8 C) (01/13 0443) Pulse Rate:  [80-114] 88 (01/13 0600) Resp:  [6-34] 28 (01/13 0600) BP: (81-132)/(30-57) 132/52 mmHg (01/13 0600) SpO2:  [93 %-100 %] 99 % (01/13 0600) FiO2 (%):  [28 %] 28 % (01/13 0300) Weight:  [125 lb 14.1 oz (57.1 kg)] 125 lb 14.1 oz (57.1 kg) (01/13 0500) HEMODYNAMICS:   VENTILATOR  SETTINGS: Vent Mode:  [-]  FiO2 (%):  [28 %] 28 % INTAKE / OUTPUT:  Intake/Output Summary (Last 24 hours) at 02/02/14 3299 Last data filed at 02/02/14 0600  Gross per 24 hour  Intake   1160 ml  Output    400 ml  Net    760 ml    PHYSICAL EXAMINATION: General:  Elderly female on trach collar, and panda tube, calm, cooperative, lying in bed Neuro:  RASS 0, moves all 4s, interactive HEENT:  [place]/AT, trach in place with thick blood tinged secretions, normal trach site, No JVD noted Cardiovascular:  RRR, 3/6 systolic murmur heard best at left sternal border Lungs: CTA bilaterally, breaths non-labored Abdomen:  Soft, non-tender, non-distended Musculoskeletal:  No acute deformity or edema.  Skin:  Grossly intact  LABS: PULMONARY  Recent Labs Lab 01/30/14 1207  PHART 7.452*  PCO2ART 53.6*  PO2ART 291.0*  HCO3 36.9*  TCO2 38.5  O2SAT 99.9    CBC  Recent Labs Lab 01/27/14 0915 01/31/14 0900  HGB 12.1 10.7*  HCT 36.9 33.4*  WBC 12.2* 14.2*  PLT 263 237    COAGULATION No results for input(s): INR in the last 168 hours.  CARDIAC    Recent Labs Lab 01/30/14 1840 01/30/14 2100 01/31/14 0540  TROPONINI 3.50* 3.13* 1.91*   No results for input(s): PROBNP in the last 168 hours.   CHEMISTRY  Recent Labs Lab 01/27/14 0305 01/28/14 0327 01/29/14 0329 01/30/14 0323 01/31/14 0900  NA 144 142 139 142 142  K 3.8 3.6 3.8 3.9 3.5  CL 112 103 98 100  104  CO2 28 34* 31 39* 31  GLUCOSE 114* 126* 131* 110* 123*  BUN 24* 22 33* 30* 35*  CREATININE 0.55 0.61 0.67 0.72 0.77  CALCIUM 7.9* 8.2* 8.4 9.3 9.0  MG 2.3 2.2  --   --   --   PHOS 1.9* 2.0*  --   --   --    Estimated Creatinine Clearance: 40.7 mL/min (by C-G formula based on Cr of 0.77).   LIVER  Recent Labs Lab 01/30/14 0323 01/31/14 0900  AST 26 40*  ALT 30 36*  ALKPHOS 75 68  BILITOT 0.7 0.8  PROT 5.0* 5.4*  ALBUMIN 2.5* 2.7*     INFECTIOUS No results for input(s): LATICACIDVEN, PROCALCITON  in the last 168 hours.   ENDOCRINE CBG (last 3)   Recent Labs  01/31/14 0013 01/31/14 0307 01/31/14 0753  GLUCAP 89 106* 126*      IMAGING x48h No results found.  ASSESSMENT / PLAN:  PULMONARY OETT 12/30>01/21/14, 01/22/14 >> 01/25/14 trached 01/25/14 due to several reintubations 2/2 to airway edema/injury  A: BaselineHx of COPD NOS - AFL on spiro - follows with [facility]  ? Baseline CXR July 2015 with ?? ILD change but CT 01/20/14 showed only PNA. No ILD Acute resp failure - improving but still with a lot of thick blood tinged secretions Upper airway trauma /edema  On trach collar now since 01/25/14 on vent 1/10 for DCCV w/o vent since 1/11 Secretions ? Tracheal bronchitis 1/13  P:   On trach collar, tolerating well with secretions  Prn Alb q2, Pulmicort 0.25 mg bid  RT and RN prn suctioning  Will re-Cx today resp secretions. Consider Abx based on clinical  Repeat CXR in am   CARDIOVASCULAR A:  Normotensive  AF w/ RVR and shock- resolved now NSR NSTEMI P:  Cardioverted 1/10- NSR Continue Metoprolol 12.5 mg BID Cardiology consulted>> recommend antiarrhythmic if has reoccurrence of AFib. Anticoagulation not recommended, may complicate clinical course with trach.  RENAL A:   Severe low phos resolved  HypoK P:   Replaced K 10 x 3 iv and 40 po x 1  Trend BMET intermittently  Strict i/o    GASTROINTESTINAL A:   H/o GERD Nutrition  P:   Pepcid 20 qd  dys 2 diet  Removed panda    HEMATOLOGIC A:   dvt prevention P:  Lovenox for VTE ppx    INFECTIOUS  A:   Leukocytosis still going on possibly steroid vs tracheal bronchitis, increased  CAP vs aspiration - PCT profile c/w localized infection. CT showed pneumonia  - admit trach aspirate with GPC in pairs repeat trach aspirate sent 01/21/14 due to tan color - normal flora  - currently afebrile. Was treated with abx with levaquin until 01/26/14.   Resp culture 12/30 oral flora, repeat 1/13  Urine 12/30 E. Coli   Bld Cx 12/30 NTD MRSA neg   P:   Abx: Ceftriaxone 12/30 >12/31 Abx: Azithromycin 12/30 >12/31 IV levaquin 01/21/14 >>1/6.   Afebrile, trend CBC Foley cath d/c'd 1/11 Will trend clinically and low threshold to start abx, f/u resp Cx  ENDOCRINE  A: Minimal hyperglycemia  P:   Monitor on bmet  NEUROLOGIC A:   metabolic encephalopathy at night - resolved Physical deconditioning  P:   RASS goal: 0 currently 0  Active PT rec LT[facility] rolling walker   FAMILY  - Updates: Niece (HCPOA) and friend updated  at admit 01/19/2014. Will continue to update family  Transfer to Step  down unit pending 1/13  Karlyn Agee, MD Internal Medicine Resident, PGY-3   PCCM ATTENDING: I have reviewed pt's initial presentation, consultants notes and hospital database in detail.  The above assessment and plan was formulated under my direction.   Merton Border, MD;  PCCM service; Mobile [phone]

## 2014-02-02 NOTE — Progress Notes (Addendum)
Patient Name: Tanya Butler Date of Encounter: 02/02/2014     Active Problems:   Acute respiratory failure   Acute respiratory distress   Encounter for feeding tube placement   Encounter for orogastric (OG) tube placement   Respiratory failure   Endotracheal tube present   ILD (interstitial lung disease)   Encounter for nasogastric (NG) tube placement   Valvular heart disease   PAF (paroxysmal atrial fibrillation)   Atrial fibrillation with RVR   Tracheostomy status    SUBJECTIVE  Feels well . Now on 2C. No chest pain. Rhythm stable.  CURRENT MEDS . antiseptic oral rinse  7 mL Mouth Rinse QID  . aspirin  325 mg Per Tube Daily  . budesonide  0.25 mg Nebulization BID  . chlorhexidine  15 mL Mouth Rinse BID  . enoxaparin (LOVENOX) injection  40 mg Subcutaneous Q24H  . famotidine  20 mg Per Tube Daily  . feeding supplement (ENSURE)  1 Container Oral TID BM  . ipratropium-albuterol  3 mL Nebulization TID  . metoprolol tartrate  12.5 mg Per Tube BID  . potassium chloride  40 mEq Oral Once  . sodium chloride  750 mL Intravenous Once    OBJECTIVE  Filed Vitals:   02/02/14 0800 02/02/14 1000 02/02/14 1130 02/02/14 1214  BP: 144/48 136/49  121/53  Pulse: 96 92  100  Temp:   98 F (36.7 C)   TempSrc:   Oral   Resp: 28 26  33  Height:  5\' 6"  (1.676 m)    Weight:      SpO2: 95% 96%  94%    Intake/Output Summary (Last 24 hours) at 02/02/14 1243 Last data filed at 02/02/14 0800  Gross per 24 hour  Intake    960 ml  Output    430 ml  Net    530 ml   Filed Weights   01/31/14 0500 02/01/14 0500 02/02/14 0500  Weight: 128 lb 12 oz (58.4 kg) 128 lb 1.4 oz (58.1 kg) 125 lb 14.1 oz (57.1 kg)    PHYSICAL EXAM  General: Pleasant, NAD. Neuro: Alert and oriented X 3. Moves all extremities spontaneously. Psych: Normal affect. HEENT:  Normal  Neck: Supple without bruits or JVD. Lungs:  Resp regular and unlabored, CTA. Heart: RRR no s3, s4, There is a [id number]  systolic murmur at base. Abdomen: Soft, non-tender, non-distended, BS + x 4.  Extremities: No clubbing, cyanosis or edema. DP/PT/Radials 2+ and equal bilaterally.  Accessory Clinical Findings  CBC  Recent Labs  01/31/14 0900 02/02/14 0729  WBC 14.2* 15.4*  NEUTROABS  --  13.3*  HGB 10.7* 9.9*  HCT 33.4* 31.0*  MCV 94.6 96.3  PLT 237 893   Basic Metabolic Panel  Recent Labs  01/31/14 0900 02/02/14 0729  NA 142 140  K 3.5 3.3*  CL 104 101  CO2 31 31  GLUCOSE 123* 137*  BUN 35* 34*  CREATININE 0.77 0.75  CALCIUM 9.0 9.5  MG  --  2.0  PHOS  --  3.7   Liver Function Tests  Recent Labs  01/31/14 0900  AST 40*  ALT 36*  ALKPHOS 68  BILITOT 0.8  PROT 5.4*  ALBUMIN 2.7*   No results for input(s): LIPASE, AMYLASE in the last 72 hours. Cardiac Enzymes  Recent Labs  01/30/14 1840 01/30/14 2100 01/31/14 0540  TROPONINI 3.50* 3.13* 1.91*   BNP Invalid input(s): POCBNP D-Dimer No results for input(s): DDIMER in the last 72 hours. Hemoglobin A1C  No results for input(s): HGBA1C in the last 72 hours. Fasting Lipid Panel No results for input(s): CHOL, HDL, LDLCALC, TRIG, CHOLHDL, LDLDIRECT in the last 72 hours. Thyroid Function Tests No results for input(s): TSH, T4TOTAL, T3FREE, THYROIDAB in the last 72 hours.  Invalid input(s): FREET3  TELE  NSR  ECG    Radiology/Studies  Dg Chest 2 View  01/30/2014   CLINICAL DATA:  COPD exacerbation  EXAM: CHEST  2 VIEW  COMPARISON:  01/28/2014  FINDINGS: Cardiomegaly with pulmonary vascular congestion. Possible mild right perihilar edema.  Mild patchy bilateral lower lobe opacities, likely atelectasis. No pneumothorax.  Tracheostomy in satisfactory position.  Enteric tube coursing into the stomach.  IMPRESSION: Cardiomegaly with pulmonary vascular congestion and possible mild right perihilar edema.  Patchy bilateral lower lobe opacities, likely atelectasis.   Electronically Signed   By: Julian Hy M.D.   On:  01/30/2014 07:30   Ct Chest High Resolution  01/20/2014   CLINICAL DATA:  81 year old female with history of acute respiratory failure. Evaluate for interstitial lung disease.  EXAM: CT CHEST WITHOUT CONTRAST  TECHNIQUE: Multidetector CT imaging of the chest was performed following the standard protocol without intravenous contrast. High resolution imaging of the lungs, as well as inspiratory and expiratory imaging, was performed.  COMPARISON:  No priors.  FINDINGS: Mediastinum: Heart size is mildly enlarged. There is no significant pericardial fluid, thickening or pericardial calcification. There is atherosclerosis of the thoracic aorta, the great vessels of the mediastinum and the coronary arteries, including calcified atherosclerotic plaque in the left main, left anterior descending and right coronary arteries. Calcifications of the aortic valve and mitral annulus. Multiple borderline enlarged mediastinal and hilar lymph nodes, presumably reactive. Esophagus is unremarkable in appearance. Patient is intubated, with the tip of the endotracheal tube less than 1 cm above the level of the carina. Nasogastric tube extending at least of the stomach (tube extends below the lower margin of the images).  Lungs/Pleura: High-resolution images are limited by patient motion. There does appear to be relatively diffuse ground-glass attenuation with some interlobular septal thickening, favored to reflect a background of mild interstitial pulmonary edema. Given the presence of presumed edema, accurate assessment for interstitial lung disease is limited. With these limitations in mind, no definite areas of subpleural reticulation, parenchymal banding, traction bronchiectasis or frank honeycombing are appreciated. Inspiratory and expiratory imaging appears to demonstrates a mild air trapping, suggesting small airways disease. In addition, there is a focal area of airspace consolidation with some air bronchograms in the right  lower lobe, presumably an acute pneumonia. Small bilateral pleural effusions layering dependently.  Upper Abdomen: Small calcifications in the liver, presumably calcified granulomas.  Musculoskeletal: Old compression fracture of T9 with approximately 70% loss of anterior vertebral body height and 25% loss of posterior vertebral body height, unchanged compared to prior chest x-ray 07/21/2012. There are no aggressive appearing lytic or blastic lesions noted in the visualized portions of the skeleton.  IMPRESSION: 1. Despite the mild limitations of today's examination, there is no definitive evidence to suggest interstitial lung disease at this time. 2. Right lower lobe airspace consolidation and air bronchograms concerning for right lower lobe pneumonia. 3. In addition, there is mild cardiomegaly with small bilateral pleural effusions and a background of mild interstitial pulmonary edema; imaging findings suggestive of underlying congestive heart failure. 4. Support apparatus, as above. Endotracheal tube is in a low position less than 1 cm above the carina. Consider withdrawal 2-3 cm for more optimal placement. 5. Atherosclerosis,  including left main and 2 vessel coronary artery disease. These results will be called to the ordering clinician or representative by the Radiologist Assistant, and communication documented in the PACS or zVision Dashboard.   Electronically Signed   By: Vinnie Langton M.D.   On: 01/20/2014 15:27   Dg Chest Port 1 View  01/31/2014   CLINICAL DATA:  Aspiration pneumonia.  EXAM: PORTABLE CHEST - 1 VIEW  COMPARISON:  Chest radiograph 1 day prior.  FINDINGS: Tracheostomy tube remains at the thoracic inlet. Weighted enteric tube is seen, the previous coiling in the region of gastroesophageal junction is no longer present. The tip remains in the region of the stomach. Slight decrease in cardiomegaly. Small bilateral pleural effusions are again seen, not significantly changed. Slight decrease  in pulmonary edema.  IMPRESSION: 1. Coiling of the enteric tube in the region of the gastroesophageal junction on prior exam is no longer seen. The tip remains in the stomach. 2. Persistent but improving congestive heart failure.   Electronically Signed   By: Jeb Levering M.D.   On: 01/31/2014 01:26   Portable Chest Xray  01/30/2014   CLINICAL DATA:  81 year old female with acute respiratory failure.  EXAM: PORTABLE CHEST - 1 VIEW  COMPARISON:  Chest x-ray 01/30/2014  FINDINGS: A tracheostomy tube is in place with tip 5.1 cm above the carina. Metallic tipped feeding tube present in the stomach, with potential knot in the distal aspect of the feeding tube. Transcutaneous defibrillator pad seen projecting over the upper abdomen and lower left hemithorax. Lung volumes are low. Insert mild edema. Small bilateral pleural effusions (right greater than left). Mild cardiomegaly. The patient is rotated to the right on today's exam, resulting in distortion of the mediastinal contours and reduced diagnostic sensitivity and specificity for mediastinal pathology. Atherosclerosis in the thoracic aorta.  IMPRESSION: 1. Support apparatus, as above. In addition to slight retraction of the feeding tube, the feeding tube now appears coiled and likely knotted in the proximal stomach. 2. The appearance the chest suggests congestive heart failure, as above. 3. Atherosclerosis. These results will be called to the ordering clinician or representative by the Radiologist Assistant, and communication documented in the PACS or zVision Dashboard.   Electronically Signed   By: Vinnie Langton M.D.   On: 01/30/2014 13:19   Dg Chest Port 1 View  01/28/2014   CLINICAL DATA:  Shortness of breath  EXAM: PORTABLE CHEST - 1 VIEW  COMPARISON:  01/27/2014  FINDINGS: Cardiac shadow remains enlarged. A feeding catheter and tracheostomy tube are again seen and stable. Small bilateral pleural effusions are noted right greater than left with mild  basilar atelectasis. These have increased slightly in the interval from the prior exam.  IMPRESSION: Bilateral pleural effusions and basilar atelectasis. The overall appearance has worsened slightly in the right base when compare with the prior exam.   Electronically Signed   By: Inez Catalina M.D.   On: 01/28/2014 07:26   Dg Chest Port 1 View  01/27/2014   CLINICAL DATA:  81 year old female with acute respiratory failure, shortness of breath, fluid overload. Initial encounter.  EXAM: PORTABLE CHEST - 1 VIEW  COMPARISON:  01/25/2014 and earlier.  FINDINGS: Portable AP semi upright view at 0729 hr. Tracheostomy tube again projects in midline. There are then, 2 new tubes also projecting over midline and seem to course into the left upper quadrant.  Stable lung volumes. Mildly decreased pulmonary vascular congestion. Veiling basilar and retrocardiac opacity suggesting pleural effusions superimposed on  lower lobe atelectasis. No pneumothorax.  IMPRESSION: 1. Stable tracheostomy tube. Query interval placement of 2 enteric tubes, also projecting over midline. 2. Regressed but not resolved pulmonary edema. Pleural effusions and lower lobe atelectasis suspected.   Electronically Signed   By: Lars Pinks M.D.   On: 01/27/2014 07:52   Dg Chest Port 1 View  01/25/2014   CLINICAL DATA:  Shortness of breath, tracheostomy placement  EXAM: PORTABLE CHEST - 1 VIEW  COMPARISON:  01/24/2014  FINDINGS: Borderline cardiomegaly. Endotracheal and NG tube has been removed. Tracheostomy tube in place. Slight worsening in aeration with mild interstitial prominence bilaterally suspicious for interstitial edema. Small right pleural effusion with right basilar atelectasis or infiltrate. Trace left pleural effusion with left basilar atelectasis.  IMPRESSION: Tracheostomy tube in place. Slight worsening in aeration with mild interstitial prominence bilaterally suspicious for interstitial edema. Small right pleural effusion with right basilar  atelectasis or infiltrate. Trace left pleural effusion with left basilar atelectasis. No pneumothorax.   Electronically Signed   By: Lahoma Crocker M.D.   On: 01/25/2014 15:16   Dg Chest Port 1 View  01/24/2014   CLINICAL DATA:  Check endotracheal tube, shortness of Breath  EXAM: PORTABLE CHEST - 1 VIEW  COMPARISON:  01/22/2014  FINDINGS: Cardiac shadow is stable. Endotracheal tube is seen 2 cm above the carinal. This is relatively stable in appearance from the prior exam. Nasogastric catheter is seen within the stomach. Increasing right-sided effusion is noted. Increasing left retrocardiac density is also seen.  IMPRESSION: Tubes and lines as described.  Increasing bibasilar changes with effusion on the right and left lower lobe atelectasis.   Electronically Signed   By: Inez Catalina M.D.   On: 01/24/2014 07:47   Dg Chest Port 1 View  01/22/2014   CLINICAL DATA:  Re-evaluation of endotracheal tube positioning after retracting endotracheal tube  EXAM: PORTABLE CHEST - 1 VIEW  COMPARISON:  January 22, 2014 at 6:43 a.m.  FINDINGS: Endotracheal tube now identified with tip 19 mm above the carina. No change in NG tube. Stable mild cardiac enlargement and pulmonary vascular congestion.  IMPRESSION: Endotracheal tube tip 19 mm above the carina.   Electronically Signed   By: Skipper Cliche M.D.   On: 01/22/2014 10:41   Dg Chest Port 1 View  01/22/2014   CLINICAL DATA:  Respiratory failure.  EXAM: PORTABLE CHEST - 1 VIEW  COMPARISON:  01/21/2014  FINDINGS: Endotracheal tube tip is advanced to the origin of the right mainstem bronchus. The tip measures about 4 cm below the desired position above the carina. Enteric tube present and coiled in the left upper quadrant consistent with location in the stomach. Mild cardiac enlargement and pulmonary vascular congestion. No definite edema or consolidation. No pneumothorax.  IMPRESSION: Endotracheal tube tip is advanced into the right mainstem bronchus, bowel 4 cm below the  expected location above the carina.  These results were called by telephone at the time of interpretation on 01/22/2014 at 6:59 am to [facility], ICU nurse, who verbally acknowledged these results.   Electronically Signed   By: Lucienne Capers M.D.   On: 01/22/2014 07:01   Dg Chest Port 1 View  01/21/2014   CLINICAL DATA:  Interstitial lung disease, acute respiratory failure. History of hypertension and COPD.  EXAM: PORTABLE CHEST - 1 VIEW  COMPARISON:  01/20/2014  FINDINGS: Support devices are in stable position. Mild cardiomegaly with vascular congestion. Further improvement in interstitial opacities, particularly in the right lung, likely improving edema. Mild  patchy bilateral infrahilar opacities persist. No effusions.  IMPRESSION: Improving interstitial opacities with mild residual bilateral perihilar and infrahilar opacities.   Electronically Signed   By: Rolm Baptise M.D.   On: 01/21/2014 09:23   Dg Chest Port 1 View  01/20/2014   CLINICAL DATA:  Re-evaluate airspace disease, acute respiratory failure  EXAM: PORTABLE CHEST - 1 VIEW  COMPARISON:  Portable chest x-ray of January 19, 2014  FINDINGS: The lungs are hypoinflated. The endotracheal tube tip lies 1.8 cm above the crotch of the carina. The pulmonary interstitial markings are less conspicuous today but remain increased. The cardiopericardial silhouette is top-normal in size. The pulmonary vascularity is less engorged. The esophagogastric tube project below the inferior margin of the image.  IMPRESSION: There has been slight interval improvement in the appearance of the pulmonary interstitium which may reflect some resolving interstitial edema. The endotracheal tube tip lies 1.8 cm above the crotch of the carina. Withdrawal by 1 2 cm is recommended to avoid accidental mainstem bronchus intubation.   Electronically Signed   By: David  Martinique   On: 01/20/2014 07:37   Dg Chest Portable 1 View  01/19/2014   CLINICAL DATA:  Shortness of breath.  History of COPD and hypertension.  EXAM: PORTABLE CHEST - 1 VIEW  COMPARISON:  07/21/2012  FINDINGS: Endotracheal tube is in place. The tip is 2 cm above the carina. NG tube is in the stomach.  Heart is borderline in size. Patchy bilateral airspace disease, right greater than left. This could represent asymmetric edema or infection. No effusions. No acute bony abnormality.  IMPRESSION: Patchy bilateral airspace disease, right greater than left. This could represent asymmetric edema or infection.   Electronically Signed   By: Rolm Baptise M.D.   On: 01/19/2014 16:42   Dg Abd Portable 1v  01/31/2014   CLINICAL DATA:  Assess feeding tube placement. Subsequent encounter.  EXAM: PORTABLE ABDOMEN - 1 VIEW  COMPARISON:  Abdominal radiograph performed earlier today at 9:36 p.m.  FINDINGS: The feeding tube is now seen ending overlying the gastric fundus. On correlation with subsequent chest radiograph, this does extend along the course of the distal esophagus.  The visualized bowel gas pattern is grossly unremarkable. No free intra-abdominal air is seen, though evaluation for free air is limited on a single supine view.  No acute osseous abnormalities are identified. There is mild axial joint space narrowing at the right hip.  IMPRESSION: Feeding tube noted ending overlying the gastric fundus.   Electronically Signed   By: Garald Balding M.D.   On: 01/31/2014 02:00   Dg Abd Portable 1v  01/31/2014   CLINICAL DATA:  Feeding tube placement.  EXAM: PORTABLE ABDOMEN - 1 VIEW  COMPARISON:  Abdominal radiograph performed 01/27/2014  FINDINGS: The patient's feeding tube is noted ending overlying the right lung base.  The visualized bowel gas pattern is grossly unremarkable. No free intra-abdominal air is seen, though evaluation is limited on a single supine view. Left basilar airspace opacification may reflect atelectasis or possibly pneumonia.  No acute osseous abnormalities are seen. There is mild axial joint space  narrowing at the right hip.  IMPRESSION: 1. Feeding tube noted ending overlying the right lung base. This was subsequently removed and readvanced, shortly after the study was performed. 2. Left basilar airspace opacification may reflect atelectasis or possibly pneumonia.   Electronically Signed   By: Garald Balding M.D.   On: 01/31/2014 01:47   Dg Abd Portable 1v  01/27/2014  CLINICAL DATA:  Tube placement.  EXAM: PORTABLE ABDOMEN - 1 VIEW  COMPARISON:  01/26/2014.  FINDINGS: Feeding tube again noted projected over the distal stomach. Gas pattern is nonspecific. No free air identified. Cardiomegaly. Left lower lobe atelectasis and/or infiltrate.Left pleural effusion.  IMPRESSION: 1. Feeding tube in stable position. 2. Left lower lobe atelectasis and/or infiltrate with left pleural effusion. 3. Cardiomegaly.   Electronically Signed   By: Marcello Moores  Register   On: 01/27/2014 07:36   Dg Abd Portable 1v  01/26/2014   CLINICAL DATA:  Encounter for nasogastric tube placement.  EXAM: PORTABLE ABDOMEN - 1 VIEW  COMPARISON:  January 24, 2014.  FINDINGS: The bowel gas pattern is normal. Distal tip of feeding tube is seen in the expected position of the distal stomach. No radio-opaque calculi or other significant radiographic abnormality are seen.  IMPRESSION: Distal tip of feeding tube seen in expected position of distal stomach.   Electronically Signed   By: Sabino Dick M.D.   On: 01/26/2014 07:35   Dg Abd Portable 1v  01/24/2014   CLINICAL DATA:  81 year old female with enteric tube placement. Initial encounter. Current history of acute respiratory failure.  EXAM: PORTABLE ABDOMEN - 1 VIEW  COMPARISON:  01/22/2014 and earlier.  FINDINGS: Portable AP view at 0509 hrs. Enteric tube re - identified, and the tip has advanced just across midline projecting in the lower abdomen. The side hole projects just to the left of the spine. Interval decreased bowel gas, non obstructed pattern. Mildly increased retrocardiac opacity. No  definite pneumoperitoneum on this single image.  IMPRESSION: 1. Enteric tube tip has advanced just across midline in the lower abdomen compatible with antral placement within a ptotic stomach. Side hole at the level of the distal gastric body. 2. Non obstructed bowel gas pattern. 3. Increased streaky left lung base opacity.   Electronically Signed   By: Lars Pinks M.D.   On: 01/24/2014 07:51   Dg Abd Portable 1v  01/22/2014   CLINICAL DATA:  Nasogastric tube placement at bedside.  EXAM: PORTABLE ABDOMEN - 1 VIEW  COMPARISON:  None.  FINDINGS: Nasogastric tube looped in the stomach with its tip in the mid to distal body. Bowel gas pattern unremarkable without evidence of obstruction or significant ileus. External cardiac pacing pads overlie the left upper abdomen.  IMPRESSION: 1. Nasogastric tube looped in the stomach with its tip in the mid to distal body of the stomach. 2. No acute abdominal abnormality.   Electronically Signed   By: Evangeline Dakin M.D.   On: 01/22/2014 07:58    ASSESSMENT AND PLAN  1. Paroxysmal atrial fibrillation. Chadsvasc score is 4 for age greater than 8, and female sex, and hypertension. However not a good candidate for full anticoagulation at this point because of recent trach etc. Continue aspirin. Maintaining normal sinus rhythm on low dose metoprolol. 2. Respiratory failure, improved. 3. Aortic valve sclerosis with moderate aortic regurgitation by echo. 4. Mild mitral valve regurgitation with calcified mitral annulus and moderate LAE.  Plan: Continue current cardiac meds metoprolol and ASA.  Signed, Darlin Coco MD

## 2014-02-02 NOTE — Clinical Social Work Note (Signed)
FL-2 updated and re-faxed to SNF's. Clinical Social Worker continues to follow pt and pt's family for continued support and to facilitate pt's discharge needs once medically stable.   Glendon Axe, MSW, LCSWA [phone] 02/02/2014 9:55 AM

## 2014-02-02 NOTE — Progress Notes (Signed)
NUTRITION FOLLOW UP  Intervention:   Ensure Pudding PO TID to maximize oral intake, each supplement provides 170 kcal and 4 grams of protein  Nutrition Dx:   Inadequate oral intake related to dysphagia as evidenced by 50% meal completion; ongoing  Goal:   Pt to meet >/= 90% of their estimated nutrition needs; progressing.  Monitor:   PO intake, labs, weight trend.  Assessment:   81 year old female with past medical history of COPD, hypertension, skin cancer, and GERD who developed stridor and dyspnea after inhaling unknown substance in her attic 01/19/2014. She was intubated upon EMS arrival and subsequently extubated in the emergency department. However, she developed stridor and hypoxia and was once again intubated.  S/P tracheostomy on 1/5. Currently on trach collar. Transferred to SDU today. Nurse reports that patient ate ~50% of supper yesterday. TF was discontinued this AM.   Height: Ht Readings from Last 1 Encounters:  01/19/14 5\' 2"  (1.575 m)    Weight Status:   Wt Readings from Last 1 Encounters:  02/02/14 125 lb 14.1 oz (57.1 kg)  01/21/14  165 lb 01/20/14 153 lb  Body mass index is 23.02 kg/(m^2).  Re-estimated needs:  Kcal: 1300-1500 Protein: 70-80 gm Fluid: 1.5 L  Skin: intact  Diet Order: DIET DYS 2 with nectar thick liquids   Intake/Output Summary (Last 24 hours) at 02/02/14 1013 Last data filed at 02/02/14 0800  Gross per 24 hour  Intake   1060 ml  Output    430 ml  Net    630 ml    Last BM: None documented since admission  Labs:   Recent Labs Lab 01/27/14 0305 01/28/14 0327  01/30/14 0323 01/31/14 0900 02/02/14 0729  NA 144 142  < > 142 142 140  K 3.8 3.6  < > 3.9 3.5 3.3*  CL 112 103  < > 100 104 101  CO2 28 34*  < > 39* 31 31  BUN 24* 22  < > 30* 35* 34*  CREATININE 0.55 0.61  < > 0.72 0.77 0.75  CALCIUM 7.9* 8.2*  < > 9.3 9.0 9.5  MG 2.3 2.2  --   --   --  2.0  PHOS 1.9* 2.0*  --   --   --  3.7  GLUCOSE 114* 126*  < > 110*  123* 137*  < > = values in this interval not displayed.  CBG (last 3)   Recent Labs  01/31/14 0013 01/31/14 0307 01/31/14 0753  GLUCAP 89 106* 126*    Scheduled Meds: . antiseptic oral rinse  7 mL Mouth Rinse QID  . aspirin  325 mg Per Tube Daily  . budesonide  0.25 mg Nebulization BID  . chlorhexidine  15 mL Mouth Rinse BID  . enoxaparin (LOVENOX) injection  40 mg Subcutaneous Q24H  . famotidine  20 mg Per Tube Daily  . ipratropium-albuterol  3 mL Nebulization TID  . metoprolol tartrate  12.5 mg Per Tube BID  . potassium chloride  10 mEq Intravenous Q1 Hr x 3  . potassium chloride  40 mEq Oral Once  . sodium chloride  750 mL Intravenous Once    Continuous Infusions: . feeding supplement (VITAL AF 1.2 CAL) Stopped (02/02/14 0800)    Molli Barrows, RD, LDN, [place] Pager [phone] After Hours Pager [phone]

## 2014-02-03 ENCOUNTER — Inpatient Hospital Stay (HOSPITAL_COMMUNITY): Payer: Medicare Other

## 2014-02-03 DIAGNOSIS — R778 Other specified abnormalities of plasma proteins: Secondary | ICD-10-CM | POA: Diagnosis not present

## 2014-02-03 DIAGNOSIS — J441 Chronic obstructive pulmonary disease with (acute) exacerbation: Secondary | ICD-10-CM | POA: Insufficient documentation

## 2014-02-03 DIAGNOSIS — R7989 Other specified abnormal findings of blood chemistry: Secondary | ICD-10-CM

## 2014-02-03 DIAGNOSIS — I119 Hypertensive heart disease without heart failure: Secondary | ICD-10-CM | POA: Diagnosis present

## 2014-02-03 DIAGNOSIS — I351 Nonrheumatic aortic (valve) insufficiency: Secondary | ICD-10-CM | POA: Diagnosis present

## 2014-02-03 LAB — CBC
HCT: 28.4 % — ABNORMAL LOW (ref 36.0–46.0)
HEMOGLOBIN: 9 g/dL — AB (ref 12.0–15.0)
MCH: 30.8 pg (ref 26.0–34.0)
MCHC: 31.7 g/dL (ref 30.0–36.0)
MCV: 97.3 fL (ref 78.0–100.0)
PLATELETS: 202 10*3/uL (ref 150–400)
RBC: 2.92 MIL/uL — ABNORMAL LOW (ref 3.87–5.11)
RDW: 15.8 % — ABNORMAL HIGH (ref 11.5–15.5)
WBC: 16.7 10*3/uL — ABNORMAL HIGH (ref 4.0–10.5)

## 2014-02-03 LAB — BASIC METABOLIC PANEL
Anion gap: 1 — ABNORMAL LOW (ref 5–15)
BUN: 25 mg/dL — ABNORMAL HIGH (ref 6–23)
CALCIUM: 9.6 mg/dL (ref 8.4–10.5)
CO2: 34 mmol/L — ABNORMAL HIGH (ref 19–32)
Chloride: 109 mEq/L (ref 96–112)
Creatinine, Ser: 0.84 mg/dL (ref 0.50–1.10)
GFR calc non Af Amer: 62 mL/min — ABNORMAL LOW (ref >90)
GFR, EST AFRICAN AMERICAN: 71 mL/min — AB (ref >90)
Glucose, Bld: 123 mg/dL — ABNORMAL HIGH (ref 70–99)
Potassium: 4.5 mmol/L (ref 3.5–5.1)
SODIUM: 144 mmol/L (ref 135–145)

## 2014-02-03 MED ORDER — ENSURE PUDDING PO PUDG: 1.0000 | Freq: Three times a day (TID) | ORAL | Status: AC

## 2014-02-03 MED ORDER — METOPROLOL TARTRATE 25 MG/10 ML ORAL SUSPENSION: 12.5000 mg | Freq: Two times a day (BID) | ORAL | Status: AC

## 2014-02-03 MED ORDER — NITROGLYCERIN 0.4 MG SL SUBL
SUBLINGUAL_TABLET | SUBLINGUAL | Status: AC
  Administered 2014-02-03 (×3): 0.4 mg
  Filled 2014-02-03: qty 1

## 2014-02-03 MED ORDER — VITAMINS A & D EX OINT: TOPICAL_OINTMENT | CUTANEOUS | Status: AC | PRN

## 2014-02-03 MED ORDER — NITROGLYCERIN 0.4 MG SL SUBL
SUBLINGUAL_TABLET | SUBLINGUAL | Status: AC
  Filled 2014-02-03: qty 2

## 2014-02-03 MED ORDER — FENTANYL CITRATE 0.05 MG/ML IJ SOLN: 25.0000 ug | INTRAMUSCULAR | Status: AC | PRN

## 2014-02-03 MED ORDER — CETYLPYRIDINIUM CHLORIDE 0.05 % MT LIQD: 7.0000 mL | Freq: Four times a day (QID) | OROMUCOSAL | Status: AC

## 2014-02-03 MED ORDER — ASPIRIN 325 MG PO TABS: 325.0000 mg | ORAL_TABLET | Freq: Every day | ORAL | Status: AC

## 2014-02-03 MED ORDER — IPRATROPIUM-ALBUTEROL 0.5-2.5 (3) MG/3ML IN SOLN: 3.0000 mL | Freq: Three times a day (TID) | RESPIRATORY_TRACT | Status: AC

## 2014-02-03 MED ORDER — BUDESONIDE 0.25 MG/2ML IN SUSP: 0.2500 mg | Freq: Two times a day (BID) | RESPIRATORY_TRACT | Status: AC

## 2014-02-03 MED ORDER — CHLORHEXIDINE GLUCONATE 0.12 % MT SOLN: 15.0000 mL | Freq: Two times a day (BID) | OROMUCOSAL | Status: AC

## 2014-02-03 MED ORDER — ENOXAPARIN SODIUM 40 MG/0.4ML ~~LOC~~ SOLN: 40.0000 mg | SUBCUTANEOUS | Status: AC

## 2014-02-03 MED ORDER — ALBUTEROL SULFATE (2.5 MG/3ML) 0.083% IN NEBU: 2.5000 mg | INHALATION_SOLUTION | RESPIRATORY_TRACT | Status: AC | PRN

## 2014-02-03 NOTE — Progress Notes (Signed)
Discharged patient per PTAR.  Tanya Butler  Is here to see patient and is made aware of patient's discharged to [facility].

## 2014-02-03 NOTE — Progress Notes (Signed)
Report given to Summer Glover from [facility].  Patient will be transported via [facility].  [facility] has been called.  Awaiting for their arrival.  Message left to Ms. Dolores Lory to notify her of patient's discharge to [facility].

## 2014-02-03 NOTE — Progress Notes (Signed)
Patient is will be discharged to [facility].  Will inform the family before patient is being transferred.

## 2014-02-03 NOTE — Discharge Summary (Signed)
Physician Discharge Summary  Patient ID: KAOIR LOREE MRN: [id number] DOB/AGE: 1926-11-16 80 y.o.  Admit date: 01/19/2014 Discharge date: 02/03/2014    Discharge Diagnoses:  BaselineHx of COPD NOS - AFL on spiro - follows with [facility]  ? Baseline CXR July 2015 with ?? ILD change but CT 01/20/14 showed no ILD, only PNA Acute resp failure - s/p trach, now improving but still intermittent blood tinged secretions Upper airway trauma /edema  Secretions, ? Tracheal bronchitis 1/13 - improved 01/14 (note repeat cultures sent 01/13). AF w/ RVR and shock- s/p DCCV 01/10, resolved now NSR NSTEMI - troponins downtrending GERD Nutrition  VTE prophylaxis Leukocytosis - possibly steroid vs tracheal bronchitis CAP vs aspiration - treated Resp culture 12/30 >>> M. Catarrhalis, repeated 1/13  Urine 12/30 E. Coli - treated Mild hyperglycemia  Metabolic encephalopathy - resolved Physical deconditioning Pain Restless Leg Syndrome                                                                       DISCHARGE PLAN BY DIAGNOSIS    BaselineHx of COPD NOS - AFL on spiro - follows with [facility]  ? Baseline CXR July 2015 with ?? ILD change but CT 01/20/14 showed no ILD, only PNA Acute resp failure - s/p trach, now improving but still intermittent blood tinged secretions Upper airway trauma /edema  Secretions, ? Tracheal bronchitis 1/13 - improved 01/14 (note repeat cultures sent 01/13). Plan: Continue trach collar, tolerating well. RT and RN PRN suctioning.  Continue DuoNebs, Albuterol, Budesonide. Holding outpatient Aclidinium Bromide inhaler. F/u resp cultures resent 1/13 and consider Abx based on clinical course.  AF w/ RVR and shock- s/p DCCV 01/10, resolved now NSR NSTEMI - troponins downtrending Plan: Continue Aspirin, Metoprolol 12.5 mg BID. Cardiology consulted>> recommend antiarrhythmic if has reoccurrence of AFib. Anticoagulation not recommended, may complicate clinical course  with trach. Holding outpatient Propranolol, Verapamil.  GERD Nutrition  Plan: Continue outpatient pantoprazole. Continue dysphagia 2 diet per SLP recs.  VTE prophylaxis Plan: Lovenox.  Leukocytosis - possibly steroid vs tracheal bronchitis CAP vs aspiration - treated Resp culture 12/30 >>> M. Catarrhalis, repeated 1/13  Urine 12/30 E. Coli - treated Plan: F/u on resp culture resent 01/13, monitor clinically with low threshold to start abx.  Mild hyperglycemia  Plan: SSI if glucose consistently > 180.  Metabolic encephalopathy - resolved Physical deconditioning  Pain Restless Leg Syndrome Plan: PT consulted 1/6 and recommended LTAC rolling walker. Fentanyl PRN. Continue outpatient Ropinirole.                DISCHARGE SUMMARY   Tanya Butler is a 81 y.o. y/o female with a PMH of HTN, HLD, RLS, Osteoporosis, GERD, COPD, Skin CA.  She presented to [facility] ED on 01/19/14 in respiratory distress after she was found by family 15 minutes after going into her attic to clean.  Apparently she was having trouble breathing and lips were noted to be blue.  On EMS arrival, she was noted to have dyspnea and stridor and was therefore intubated.  On arrival to ED, she was awake/alert and following commands.  She also had a cuff leak so ended up being extubated.  Unfortunately while in ED, she developed stridor and hypoxia again and subsequently  required re-intubation.  She was admitted to the ICU for further evaluation and management.  The highlights of her hospitalization are outlined below. On AM of 02/03/13, Tanya Butler was deemed too be medically stable and cleared for discharge to SNF.            SIGNIFICANT DIAGNOSTIC STUDIES HRCT Chest 12/31 >>> no evidence to suggest ILD, RLL airspace consolidation and air bronchograms c/w RLL PNA, mild cardiomegaly with small b/l pleural effusions and background of mild interstitial pulmonary edema, atherosclerosis including left main and 2  vessel coronary artery disease.  SIGNIFICANT EVENTS 01/19/14 - admitted to ICU for respiratory failure. 01/20/14 - Failed SBT, autoimmune panel negative. 01/21/14 - Extubated. Repeat tracheal aspirate sent. 01/22/14 - reintubated due to agitation / recurent sun downinng.  Developed hypotension requiring neosynephrine aftter failing fluid bolus.  01/23/14 - extubated but re intubated later in the afternoon due to stridor. 01/25/14 - trached. Reduced steroid to 76m BID. 01/26/14 and 01/27/14 - remained on trach collar. Reduced steroid to 20 daily. 01/30/14 - developed new AF w/ RVR HR 200s. Placed back on vent and cardioverted. 02/01/14 - cleared for dysphagia 2 diet per SLP recs 02/02/14 - maintaining NSR, transferred to SDU 02/03/14 - trach changed from #6 cuffed to #6 cuffless.  Deemed medically stable and cleared for discharge.  MICRO DATA  MRSA 12/30 >>> neg Sputum 12/30 >>> moderate Moraxella Catarrhalis Urine 12/30 >>> E.coli Sputum 01/01 >>> oral flora Blood 12/30 >>> neg Sputum 01/13 >>> mod WBC's, mod GPR's, few GPC's >>>   ANTIBIOTICS Ceftriaxone 12/30 >>> 12/31 Azithromycin 12/30 >>> 12/31 Levaquin 12/30 >>> 01/06    CONSULTS Cardiology  TUBES / LINES ETT 12/30 >>> 01/01, 01/02 >>> 01/05 OGT 12/30 >>> 01/01, 01/02 >>> 01/05 Trach 01/05 >>>   Discharge Exam: General: Elderly female on trach collar, resting comfortable in bed, in NAD. Neuro: A&O x 3, non-focal.  HEENT: [place]/AT. PERRL, sclerae anicteric.  Trach in place, C/D/I. Cardiovascular: RRR, 3/6 SEM. Lungs: Respirations even and unlabored.  CTA bilaterally, No W/R/R. Abdomen: BS x 4, soft, NT/ND.  Musculoskeletal: No gross deformities, no edema.  Skin: Intact, warm, no rashes.   Filed Vitals:   02/03/14 0903 02/03/14 1214 02/03/14 1223 02/03/14 1305  BP:   101/51 106/52  Pulse: 92  87 85  Temp:    98.5 F (36.9 C)  TempSrc:    Oral  Resp: '24  24 19  ' Height:      Weight:      SpO2: 95% 95% 95% 98%     Discharge Labs  BMET  Recent Labs Lab 01/28/14 0327 01/29/14 0329 01/30/14 0323 01/31/14 0900 02/02/14 0729 02/03/14 0238  NA 142 139 142 142 140 144  K 3.6 3.8 3.9 3.5 3.3* 4.5  CL 103 98 100 104 101 109  CO2 34* 31 39* 31 31 34*  GLUCOSE 126* 131* 110* 123* 137* 123*  BUN 22 33* 30* 35* 34* 25*  CREATININE 0.61 0.67 0.72 0.77 0.75 0.84  CALCIUM 8.2* 8.4 9.3 9.0 9.5 9.6  MG 2.2  --   --   --  2.0  --   PHOS 2.0*  --   --   --  3.7  --     CBC  Recent Labs Lab 01/31/14 0900 02/02/14 0729 02/03/14 0238  HGB 10.7* 9.9* 9.0*  HCT 33.4* 31.0* 28.4*  WBC 14.2* 15.4* 16.7*  PLT 237 198 202    Discharge Instructions    Call MD for:  difficulty breathing, headache or visual disturbances    Complete by:  As directed      Call MD for:  extreme fatigue    Complete by:  As directed      Call MD for:  persistant nausea and vomiting    Complete by:  As directed      Call MD for:  redness, tenderness, or signs of infection (pain, swelling, redness, odor or green/yellow discharge around incision site)    Complete by:  As directed      Call MD for:  severe uncontrolled pain    Complete by:  As directed      Call MD for:  temperature >100.4    Complete by:  As directed      Increase activity slowly    Complete by:  As directed              Medication List    STOP taking these medications        Aclidinium Bromide 400 MCG/ACT Aepb  Commonly known as:  TUDORZA PRESSAIR     albuterol 108 (90 BASE) MCG/ACT inhaler  Commonly known as:  PROVENTIL HFA;VENTOLIN HFA  Replaced by:  albuterol (2.5 MG/3ML) 0.083% nebulizer solution     budesonide-formoterol 80-4.5 MCG/ACT inhaler  Commonly known as:  SYMBICORT     propranolol 10 MG tablet  Commonly known as:  INDERAL     rOPINIRole 0.5 MG tablet  Commonly known as:  REQUIP     rOPINIRole 2 MG tablet  Commonly known as:  REQUIP     verapamil 120 MG CR tablet  Commonly known as:  CALAN-SR      TAKE these  medications        acetaminophen 325 MG tablet  Commonly known as:  TYLENOL  Take 2 tablets (650 mg total) by mouth every 6 (six) hours as needed.     albuterol (2.5 MG/3ML) 0.083% nebulizer solution  Commonly known as:  PROVENTIL  Take 3 mLs (2.5 mg total) by nebulization every 2 (two) hours as needed for wheezing.     antiseptic oral rinse 0.05 % Liqd solution  Commonly known as:  CPC / CETYLPYRIDINIUM CHLORIDE 0.05%  7 mLs by Mouth Rinse route QID.     aspirin 325 MG tablet  Place 1 tablet (325 mg total) into feeding tube daily.     budesonide 0.25 MG/2ML nebulizer solution  Commonly known as:  PULMICORT  Take 2 mLs (0.25 mg total) by nebulization 2 (two) times daily.     chlorhexidine 0.12 % solution  Commonly known as:  PERIDEX  15 mLs by Mouth Rinse route 2 (two) times daily.     CITRACAL + D PO  Take 1 tablet by mouth daily. Cacium 400 mg + Vitamin D (cholecalciferol) 500 I.U.     enoxaparin 40 MG/0.4ML injection  Commonly known as:  LOVENOX  Inject 0.4 mLs (40 mg total) into the skin daily.     feeding supplement (ENSURE) Pudg  Take 1 Container by mouth 3 (three) times daily between meals.     fentaNYL 0.05 MG/ML injection  Commonly known as:  SUBLIMAZE  Inject 0.5 mLs (25 mcg total) into the vein every 2 (two) hours as needed for severe pain.     ipratropium-albuterol 0.5-2.5 (3) MG/3ML Soln  Commonly known as:  DUONEB  Take 3 mLs by nebulization 3 (three) times daily.     iron polysaccharides 150 MG capsule  Commonly known as:  NIFEREX  Take 1 capsule (150 mg total) by mouth daily.     metoprolol tartrate 25 mg/10 mL Susp  Commonly known as:  LOPRESSOR  Place 5 mLs (12.5 mg total) into feeding tube 2 (two) times daily.     pantoprazole 40 MG tablet  Commonly known as:  PROTONIX  Take 1 tablet (40 mg total) by mouth daily at 12 noon.     vitamin A & D ointment  Apply topically as needed for dry skin.        Disposition:  SNF.  Discharged  Condition: Tanya Butler has met maximum benefit of inpatient care and is medically stable and cleared for discharge.  Patient is pending follow up as above.      Time spent on disposition:  Greater than 45 minutes.   Montey Hora, [place] [facility] Pgr: [phone]  or [phone]   Patient seen and examined, agree with above note.  I dictated the care and orders written for this patient under my direction.  Rush Farmer, MD [phone]

## 2014-02-03 NOTE — Progress Notes (Signed)
Speech Language Pathology Treatment: Dysphagia;Passy Muir Speaking valve  Patient Details Name: Tanya Butler MRN: [id number] DOB: 1926/09/03 Today's Date: 02/03/2014 Time: 0762-2633 SLP Time Calculation (min) (ACUTE ONLY): 15 min  Assessment / Plan / Recommendation Clinical Impression  Pt seen for f/u treatment after change to #6 cuffless trach earlier today. Pt with PMSV in place upon SLP arrival, stating that she had been wearing it throughout the afternoon without difficulty. VS remained stable throughout session. Pt had good volume and quality for voicing, as well as good intelligibility at the conversational level.  Pt tolerating cup sips of nectar thick liquids with Mod I. Pt did however report that her food is too difficult to swallow, and requests a downgrade to pureed textures. SLP will adjust order accordingly.   HPI HPI: 81 year old female with past medical history as outlined below, which includes COPD (RB patient), hypertension, skin cancer, and GERD. She presented to [facility] emergency department 01/19/2014 with respiratory distress. Pt was intubated 12/30-1/1, 1/2-1/3, and again 1/3 until she received trach on 1/5. Pt currently with #6 cuffed Shiley and initiated TCT 1/6.   Pertinent Vitals Pain Assessment: No/denies pain  SLP Plan  Continue with current plan of care    Recommendations Diet recommendations: Dysphagia 1 (puree);Nectar-thick liquid Liquids provided via: Cup Medication Administration: Whole meds with puree Supervision: Patient able to self feed;Full supervision/cueing for compensatory strategies Compensations: Slow rate;Small sips/bites;Multiple dry swallows after each bite/sip Postural Changes and/or Swallow Maneuvers: Seated upright 90 degrees;Upright 30-60 min after meal      Patient may use Passy-Muir Speech Valve: During all waking hours (remove during sleep) PMSV Supervision: Intermittent MD: Please consider changing trach tube to : Smaller size      Oral Care Recommendations: Oral care BID Follow up Recommendations: Skilled Nursing facility Plan: Continue with current plan of care    GO      [name], M.A. CCC-SLP [phone]  [name] 02/03/2014, 4:55 PM

## 2014-02-03 NOTE — Clinical Social Work Note (Signed)
CSW spoke with Dr who stated patient will be able to DC today to [facility] healthcare SNF.  CSW confirmed bed with [facility]- bed available today- patient still needs to be changed to uncuffed before going to SNF.  CSW spoke with Dr who will uncuff trach.  CSW informed family of discharge- family agreeable.  CSW will continue to follow.  Domenica Reamer, [place] Social Worker [phone]

## 2014-02-03 NOTE — Progress Notes (Signed)
PULMONARY / CRITICAL CARE MEDICINE   Name: Tanya Butler MRN: [id number] DOB: 1926-03-06    ADMISSION DATE:  01/19/2014 CONSULTATION DATE:  01/19/2014  REFERRING MD :  EDP  CHIEF COMPLAINT:  Respiratory distress  INITIAL PRESENTATION:  81 year old female with past medical history as outlined below, which includes COPD (RB patient), hypertension, skin cancer, and GERD. She presented to [facility] emergency department 01/19/2014 with respiratory distress.  SIGNIFICANT EVENTS: 01/19/2014 -admit 01/20/14 -  Patient  And family denied to RN that there was dust exposure. Patient went upto attic and just got very dyspneic. Failed SBT earlier. CT - with pneumonia. No ILD. RF  And all autoimmune- negative 01/21/14: Extubated. Repeat tracheal aspirate sent - 01/22/2014: reintubated earlier due to agitation - seems to have recurent sun downinng. This AM was hypotensive - Rx neo aftter failing fluid bolus.  01/23/14: extubated but re intubated in the afternoon due to stridor  01/23/14 reintubated after developing stridor. Had swollen arytenoid. 01/24/14: No events.  01/25/14: trached. Reduced steroid to 60mg  BID. 1/6 and 1/7: remained on trach collar. Reduced steroid to 20 daily. 1/10: now in AF w/ RVR HR 200s. Moved back to ICU, Placed back on vent. Cardioverted 1/13 NSR, transferred to SDU  SUBJECTIVE/OVERNIGHT/INTERVAL HX Had episode of chest pain early AM, received NTG which relieved pain.  EKG negative. Not having pain any longer, denies SOB.  Tracheal secretions improved, no blood per pt.  VITAL SIGNS: Temp:  [98 F (36.7 C)-98.7 F (37.1 C)] 98.7 F (37.1 C) (01/14 0800) Pulse Rate:  [84-109] 92 (01/14 0903) Resp:  [18-34] 24 (01/14 0903) BP: (98-134)/(43-96) 132/96 mmHg (01/14 0900) SpO2:  [94 %-100 %] 95 % (01/14 0903) FiO2 (%):  [28 %] 28 % (01/14 0903) HEMODYNAMICS:   VENTILATOR SETTINGS: Vent Mode:  [-]  FiO2 (%):  [28 %] 28 % INTAKE / OUTPUT:  Intake/Output Summary (Last 24  hours) at 02/03/14 1032 Last data filed at 02/03/14 0900  Gross per 24 hour  Intake    865 ml  Output      0 ml  Net    865 ml    PHYSICAL EXAMINATION: General:  Elderly female on trach collar, resting comfortably, in NAD. Neuro:  A&O x 3, MAE's. HEENT:  [place]/AT, trach in place C/D/I, no secretions. Cardiovascular:  RRR, 3/6 systolic murmur heard best at left sternal border. Lungs: CTA bilaterally, breaths non-labored. Abdomen:  Soft, non-tender, non-distended, Musculoskeletal:  No acute deformity or edema.  Skin:  Warm, dry.  LABS: PULMONARY  Recent Labs Lab 01/30/14 1207  PHART 7.452*  PCO2ART 53.6*  PO2ART 291.0*  HCO3 36.9*  TCO2 38.5  O2SAT 99.9    CBC  Recent Labs Lab 01/31/14 0900 02/02/14 0729 02/03/14 0238  HGB 10.7* 9.9* 9.0*  HCT 33.4* 31.0* 28.4*  WBC 14.2* 15.4* 16.7*  PLT 237 198 202    COAGULATION No results for input(s): INR in the last 168 hours.  CARDIAC    Recent Labs Lab 01/30/14 1840 01/30/14 2100 01/31/14 0540  TROPONINI 3.50* 3.13* 1.91*   No results for input(s): PROBNP in the last 168 hours.   CHEMISTRY  Recent Labs Lab 01/28/14 0327 01/29/14 0329 01/30/14 0323 01/31/14 0900 02/02/14 0729 02/03/14 0238  NA 142 139 142 142 140 144  K 3.6 3.8 3.9 3.5 3.3* 4.5  CL 103 98 100 104 101 109  CO2 34* 31 39* 31 31 34*  GLUCOSE 126* 131* 110* 123* 137* 123*  BUN 22 33* 30*  35* 34* 25*  CREATININE 0.61 0.67 0.72 0.77 0.75 0.84  CALCIUM 8.2* 8.4 9.3 9.0 9.5 9.6  MG 2.2  --   --   --  2.0  --   PHOS 2.0*  --   --   --  3.7  --    Estimated Creatinine Clearance: 44.1 mL/min (by C-G formula based on Cr of 0.84).   LIVER  Recent Labs Lab 01/30/14 0323 01/31/14 0900  AST 26 40*  ALT 30 36*  ALKPHOS 75 68  BILITOT 0.7 0.8  PROT 5.0* 5.4*  ALBUMIN 2.5* 2.7*     INFECTIOUS No results for input(s): LATICACIDVEN, PROCALCITON in the last 168 hours.   ENDOCRINE CBG (last 3)  No results for input(s): GLUCAP in the  last 72 hours.   IMAGING x48h Dg Chest Port 1 View  02/03/2014   CLINICAL DATA:  Respiratory failure, tracheostomy patient  EXAM: PORTABLE CHEST - 1 VIEW  COMPARISON:  Portable chest x-ray of January 31, 2014  FINDINGS: The lungs are less well inflated today. The interstitial markings remain mildly increased. The cardiopericardial silhouette is enlarged. The pulmonary vascularity is not engorged. The tracheostomy appliance tip projects between the clavicular heads. The feeding tube has been removed.  IMPRESSION: Allowing for differences in inflation there has not been significant interval change in the appearance of the chest. There is low-grade CHF. There is no alveolar pneumonia.   Electronically Signed   By: David  Martinique   On: 02/03/2014 07:34    ASSESSMENT / PLAN:  PULMONARY OETT 12/30>01/21/14, 01/22/14 >> 01/25/14 Trached 01/25/14 >>> A: BaselineHx of COPD NOS - AFL on spiro - follows with [facility]  ? Baseline CXR July 2015 with ?? ILD change but CT 01/20/14 showed only PNA. No ILD Acute resp failure - improving but still with a lot of thick blood tinged secretions Upper airway trauma /edema  Secretions, ? Tracheal bronchitis 1/13 - improved 01/14 (note repeat cultures sent 01/13). P:   On trach collar, tolerating well Continue DuoNebs, Albuterol, Budesonide. RT and RN prn suctioning.  F/u resp cultures resent 1/13 and consider Abx based on clinical course. Repeat CXR in AM.  CARDIOVASCULAR A:  AF w/ RVR and shock- s/p DCCV 01/10, resolved now NSR NSTEMI - troponins downtrending P:  Continue Metoprolol 12.5 mg BID. Cardiology consulted>> recommend antiarrhythmic if has reoccurrence of AFib. Anticoagulation not recommended, may complicate clinical course with trach.  RENAL A:   Hypophosphatemia - resolved  Hypokalemia - resolved P:   Trend BMET intermittently.  Strict I/O's.  GASTROINTESTINAL A:   H/o GERD Nutrition  P:   Pepcid 20 qd. Continue dysphagia 2 diet.    HEMATOLOGIC A:   VTE prophylaxis P:  Lovenox. Follow CBC intermittently.  INFECTIOUS A:   Leukocytosis - possibly steroid vs tracheal bronchitis, increased  CAP vs aspiration - PCT profile c/w localized infection. CT showed pneumonia Resp culture 12/30 >>> M. Catarrhalis, repeat 1/13  Urine 12/30 E. Coli  Bld Cx 12/30 NTD MRSA neg  P:   Abx: Ceftriaxone 12/30 >12/31 Abx: Azithromycin 12/30 >12/31 Abx: levaquin 01/21/14 >>1/6.  F/u on resp culture resent 01/13, monitor clinically with low threshold to start abx.  ENDOCRINE A: Mild hyperglycemia  P:   SSI if glucose consistently > 180.  NEUROLOGIC A:   Metabolic encephalopathy - resolved Physical deconditioning  P:   PT consulted 1/6 and recommended LTAC rolling walker.  FAMILY  - Updates: Niece (HCPOA) and friend updated  at admit 01/19/2014. Will  continue to update family.  Ready for discharge to SNF today, will communicate with CSW.  Montey Hora, [place] Pul[facility] & Critical [facility] Medicine Pgr: [phone]  or [phone] 02/03/2014, 10:43 AM

## 2014-02-03 NOTE — Progress Notes (Signed)
Pt complaining of chest pain 7/10. O2 sat 95% on trach collar 28%, no signs of respiratory distress. Pt denies weakness in arms. Gave her SL nitrostat X3 and obtained EKG. Chest pain now subsided. MD notified and orders received to continue monitoring pt and notify cardiology if abnormal EKG results. Will continue to closely monitor pt.

## 2014-02-03 NOTE — Trach Care Team (Signed)
[place] Progression Note   Patient Details Name: Tanya Butler MRN: [id number] DOB: 1926/12/14 Today's Date: 02/03/2014   Tracheostomy Assessment    Tracheostomy Shiley 6 mm Cuffed (Active)  Status Secured 02/03/2014 12:23 PM  Site Assessment Crusty 02/03/2014 12:23 PM  Site Care Cleansed;Dried;Dressing applied 02/03/2014  5:25 AM  Inner Cannula Care Changed/new 02/03/2014  5:25 AM  Ties Assessment Clean;Dry;Secure 02/03/2014 12:23 PM  Cuff pressure (cm) 0 cm 02/03/2014 12:23 PM  Trach Changed Yes 01/29/2014  7:35 AM  Emergency Equipment at bedside Yes 02/03/2014 12:23 PM     Care Needs Suture removal post-op day #7 :  (sutures are due to be removed, katie rt made aware )   Respiratory Therapy O2 Device: Tracheostomy Collar FiO2 (%): 28 % SpO2: 98 % Education:  (none needed at this time) Follow up recommendations:  (will follow for pt progress) Respiratory barriers to progression:  (plan is for pt to be transferred to SNF with trach )    Speech Language Pathology  Patient may use Passy-Muir Speech Valve: Intermittently with supervision PMSV Supervision: Full MD: Please consider changing trach tube to : Smaller size, Cuffless Proceed with Objective Swallowing Evaluation: FEES Follow up Recommendations: LTACH   Physical Therapy Ambulation/Gait assistance: Min guard PT Recommendation/Assessment: Patient needs continued PT services Follow Up Recommendations: LTACH, Supervision for mobility/OOB PT equipment: Rolling walker with 5" wheels    Occupational Therapy      Nutritional Patient's Current Diet: Other (Comment) (dysphagia diet) Tube Feeding: Vital AF 1.2 Cal Tube Feeding Frequency: Continuous Tube Feeding Strength: Full strength Diet Recommendations: Dysphagia 2 (Fine chop), Nectar-thick liquid    Case Management/Social Work Level of patient care prior to hospitalization: Home-Self care Living status: Alone Insurance payer: Medicare [facility]) Anticipated discharge  disposition: SNF/Assisted  living facility    Provider [place] Team/Provider                           Recommendations [place] Team Members Present-  Ciro Backer, RT & Molli Barrows, RD    None; for discharge with bed available.         Hektor Huston, Jaci Carrel ((scribe for team) 02/03/2014, 3:13 PM

## 2014-02-03 NOTE — Progress Notes (Signed)
Removed sutures at this time per MD order. Post trach day 9. No complications. RT will continue to monitor.

## 2014-02-03 NOTE — Clinical Social Work Note (Signed)
Patient will discharge to [facility] Anticipated discharge date:02/03/14 Family notified:Diane Actor by [employer]- RN to call when patient ready  CSW signing off.  Domenica Reamer, [place] Social Worker [phone]

## 2014-02-03 NOTE — Procedures (Signed)
First Trach Change  Tracheostomy size 6 cuffed trach was placed on 01/25/2014, trach changer placed, old trach removed, size six cuffless trach placed with good color change.  Patient tolerated the procedure well without complications.    Rush Farmer, M.D. [facility] Pulmonary/Critical Care Medicine. Pager: [phone]. After hours pager: [phone].

## 2014-02-03 NOTE — Progress Notes (Signed)
Subjective:  No distress, chronically ill appearing  Objective:  Vital Signs in the last 24 hours: Temp:  [98 F (36.7 C)-98.7 F (37.1 C)] 98.7 F (37.1 C) (01/14 0800) Pulse Rate:  [84-109] 92 (01/14 0903) Resp:  [18-34] 24 (01/14 0903) BP: (98-134)/(43-96) 132/96 mmHg (01/14 0900) SpO2:  [94 %-100 %] 95 % (01/14 0903) FiO2 (%):  [28 %] 28 % (01/14 0903)  Intake/Output from previous day:  Intake/Output Summary (Last 24 hours) at 02/03/14 1141 Last data filed at 02/03/14 0900  Gross per 24 hour  Intake    865 ml  Output      0 ml  Net    865 ml    Physical Exam: General appearance: alert, cooperative, no distress and trach Lungs: decreased breath sounds with scattered rhonchi Heart: regular rate and rhythm and 2/6 systolic murmur   Rate: 921  Rhythm: normal sinus rhythm, sinus tachycardia, premature atrial contractions (PAC) and premature ventricular contractions (PVC)  Lab Results:  Recent Labs  02/02/14 0729 02/03/14 0238  WBC 15.4* 16.7*  HGB 9.9* 9.0*  PLT 198 202    Recent Labs  02/02/14 0729 02/03/14 0238  NA 140 144  K 3.3* 4.5  CL 101 109  CO2 31 34*  GLUCOSE 137* 123*  BUN 34* 25*  CREATININE 0.75 0.84   No results for input(s): TROPONINI in the last 72 hours.  Invalid input(s): CK, MB No results for input(s): INR in the last 72 hours.  Imaging: Imaging results have been reviewed  Cardiac Studies:  Assessment/Plan:  81 year old female with PMH of COPD (RB patient), hypertension, skin cancer, and GERD. She presented to [facility] emergency department 01/19/2014 with respiratory distress after working in her attic. She had recurrent respiratory failure and had a trach placed. 01/25/14. Echo 01/21/14 showed good LVF with moderate AR. On 01/30/14 she went into rapid AF requiring urgent cardioversion (rate 200). Cardiology consulted then.   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 Consider antiarrythmic therapy if she has recurrent PAF. Not on Coumadin secondary to increased risk of bleed from trach,  (CHA2DS2-Vasc - 4). Currently on full dose ASA and low dose Metoprolol.  Kerin Ransom PA-C Beeper [phone] 02/03/2014, 11:41 AM  Scheduled Meds: . antiseptic oral rinse  7 mL Mouth Rinse QID  . aspirin  325 mg Per Tube Daily  . budesonide  0.25 mg Nebulization BID  . chlorhexidine  15 mL Mouth Rinse BID  . enoxaparin (LOVENOX) injection  40 mg Subcutaneous Q24H  . famotidine  20 mg Per Tube Daily  . feeding supplement (ENSURE)  1 Container Oral TID BM  . ipratropium-albuterol  3 mL Nebulization TID  . metoprolol tartrate  12.5 mg Per Tube BID  . sodium chloride  750 mL Intravenous Once   Continuous Infusions: . feeding supplement (VITAL AF 1.2 CAL) Stopped (02/02/14 0800)   PRN Meds:.sodium chloride, albuterol, fentaNYL, vitamin A & D   Personally seen and examined. Agree with above. If AFIB returns, amio may be choice AAD but has its issues with underlying lung disease.  Good EF Continue current  meds.  Will sign off.   Candee Furbish, MD

## 2014-02-04 ENCOUNTER — Emergency Department (HOSPITAL_COMMUNITY)
Admission: EM | Admit: 2014-02-04 | Discharge: 2014-02-04 | Disposition: A | Discharge status: Home / Self Care | Payer: Medicare Other | Attending: Emergency Medicine | Admitting: Emergency Medicine

## 2014-02-04 ENCOUNTER — Emergency Department (HOSPITAL_COMMUNITY): Payer: Medicare Other

## 2014-02-04 ENCOUNTER — Encounter (HOSPITAL_COMMUNITY): Payer: Self-pay | Admitting: Physical Medicine and Rehabilitation

## 2014-02-04 DIAGNOSIS — G2581 Restless legs syndrome: Secondary | ICD-10-CM | POA: Diagnosis not present

## 2014-02-04 DIAGNOSIS — I10 Essential (primary) hypertension: Secondary | ICD-10-CM | POA: Diagnosis not present

## 2014-02-04 DIAGNOSIS — I4891 Unspecified atrial fibrillation: Secondary | ICD-10-CM | POA: Diagnosis present

## 2014-02-04 DIAGNOSIS — J449 Chronic obstructive pulmonary disease, unspecified: Secondary | ICD-10-CM | POA: Diagnosis present

## 2014-02-04 DIAGNOSIS — R7989 Other specified abnormal findings of blood chemistry: Secondary | ICD-10-CM

## 2014-02-04 DIAGNOSIS — Z87891 Personal history of nicotine dependence: Secondary | ICD-10-CM | POA: Diagnosis not present

## 2014-02-04 DIAGNOSIS — Z7951 Long term (current) use of inhaled steroids: Secondary | ICD-10-CM | POA: Insufficient documentation

## 2014-02-04 DIAGNOSIS — M810 Age-related osteoporosis without current pathological fracture: Secondary | ICD-10-CM | POA: Insufficient documentation

## 2014-02-04 DIAGNOSIS — J441 Chronic obstructive pulmonary disease with (acute) exacerbation: Secondary | ICD-10-CM | POA: Insufficient documentation

## 2014-02-04 DIAGNOSIS — Z87448 Personal history of other diseases of urinary system: Secondary | ICD-10-CM | POA: Insufficient documentation

## 2014-02-04 DIAGNOSIS — Z79899 Other long term (current) drug therapy: Secondary | ICD-10-CM | POA: Insufficient documentation

## 2014-02-04 DIAGNOSIS — Z8639 Personal history of other endocrine, nutritional and metabolic disease: Secondary | ICD-10-CM | POA: Insufficient documentation

## 2014-02-04 DIAGNOSIS — J849 Interstitial pulmonary disease, unspecified: Secondary | ICD-10-CM | POA: Diagnosis present

## 2014-02-04 DIAGNOSIS — L909 Atrophic disorder of skin, unspecified: Secondary | ICD-10-CM

## 2014-02-04 DIAGNOSIS — I351 Nonrheumatic aortic (valve) insufficiency: Secondary | ICD-10-CM | POA: Diagnosis present

## 2014-02-04 DIAGNOSIS — R0602 Shortness of breath: Secondary | ICD-10-CM | POA: Diagnosis present

## 2014-02-04 DIAGNOSIS — R778 Other specified abnormalities of plasma proteins: Secondary | ICD-10-CM | POA: Diagnosis present

## 2014-02-04 DIAGNOSIS — I422 Other hypertrophic cardiomyopathy: Secondary | ICD-10-CM

## 2014-02-04 DIAGNOSIS — Z7982 Long term (current) use of aspirin: Secondary | ICD-10-CM | POA: Insufficient documentation

## 2014-02-04 DIAGNOSIS — Z85828 Personal history of other malignant neoplasm of skin: Secondary | ICD-10-CM | POA: Diagnosis not present

## 2014-02-04 DIAGNOSIS — Z930 Tracheostomy status: Secondary | ICD-10-CM | POA: Diagnosis not present

## 2014-02-04 DIAGNOSIS — I480 Paroxysmal atrial fibrillation: Secondary | ICD-10-CM | POA: Diagnosis present

## 2014-02-04 DIAGNOSIS — R042 Hemoptysis: Secondary | ICD-10-CM

## 2014-02-04 DIAGNOSIS — I119 Hypertensive heart disease without heart failure: Secondary | ICD-10-CM | POA: Diagnosis present

## 2014-02-04 LAB — CBC WITH DIFFERENTIAL/PLATELET
BASOS PCT: 0 % (ref 0–1)
Basophils Absolute: 0 10*3/uL (ref 0.0–0.1)
EOS PCT: 1 % (ref 0–5)
Eosinophils Absolute: 0.1 10*3/uL (ref 0.0–0.7)
HCT: 30 % — ABNORMAL LOW (ref 36.0–46.0)
Hemoglobin: 9.6 g/dL — ABNORMAL LOW (ref 12.0–15.0)
LYMPHS ABS: 0.7 10*3/uL (ref 0.7–4.0)
LYMPHS PCT: 6 % — AB (ref 12–46)
MCH: 31.1 pg (ref 26.0–34.0)
MCHC: 32 g/dL (ref 30.0–36.0)
MCV: 97.1 fL (ref 78.0–100.0)
MONO ABS: 0.6 10*3/uL (ref 0.1–1.0)
Monocytes Relative: 5 % (ref 3–12)
NEUTROS ABS: 10.7 10*3/uL — AB (ref 1.7–7.7)
NEUTROS PCT: 88 % — AB (ref 43–77)
PLATELETS: 224 10*3/uL (ref 150–400)
RBC: 3.09 MIL/uL — ABNORMAL LOW (ref 3.87–5.11)
RDW: 15.5 % (ref 11.5–15.5)
WBC: 12 10*3/uL — AB (ref 4.0–10.5)

## 2014-02-04 LAB — I-STAT TROPONIN, ED: Troponin i, poc: 0.18 ng/mL (ref 0.00–0.08)

## 2014-02-04 LAB — CULTURE, RESPIRATORY W GRAM STAIN

## 2014-02-04 LAB — BASIC METABOLIC PANEL
ANION GAP: 9 (ref 5–15)
BUN: 27 mg/dL — ABNORMAL HIGH (ref 6–23)
CHLORIDE: 106 meq/L (ref 96–112)
CO2: 26 mmol/L (ref 19–32)
Calcium: 8.9 mg/dL (ref 8.4–10.5)
Creatinine, Ser: 0.86 mg/dL (ref 0.50–1.10)
GFR calc Af Amer: 69 mL/min — ABNORMAL LOW (ref >90)
GFR, EST NON AFRICAN AMERICAN: 60 mL/min — AB (ref >90)
Glucose, Bld: 152 mg/dL — ABNORMAL HIGH (ref 70–99)
POTASSIUM: 4.7 mmol/L (ref 3.5–5.1)
Sodium: 141 mmol/L (ref 135–145)

## 2014-02-04 LAB — BRAIN NATRIURETIC PEPTIDE: B NATRIURETIC PEPTIDE 5: 463.5 pg/mL — AB (ref 0.0–100.0)

## 2014-02-04 LAB — CULTURE, RESPIRATORY

## 2014-02-04 NOTE — ED Notes (Signed)
Pt presents to department via [facility] for SOB. Pt has tracheostomy, was recently discharged from [facility] staff states productive cough with blood colored sputum. Pt able to follow commands, denies pain upon arrival.

## 2014-02-04 NOTE — ED Provider Notes (Signed)
CSN: [id number]     Arrival date & time 02/04/14  1015 History   First MD Initiated Contact with Patient 02/04/14 1022     Chief Complaint  Patient presents with  . Shortness of Breath    Tracheostomy      HPI Patient recently admitted to the hospital with respiratory failure and intubation.  Patient was discharged after a tracheostomy was performed.  She also had a non-ST elevation MI while in the hospital.  She was brought in today because the transitional care unit saw she was more short of breath than normal.  She has long history of COPD and respiratory difficulties.  She is denying chest pain or arm pain. Past Medical History  Diagnosis Date  . HTN (hypertension)   . Hyperlipidemia   . Restless leg syndrome   . Osteoporosis   . GERD (gastroesophageal reflux disease)   . COPD (chronic obstructive pulmonary disease)   . Hypertonicity of bladder   . Skin cancer    Past Surgical History  Procedure Laterality Date  . Skin cancer excision    . Esophagogastroduodenoscopy N/A 07/15/2012    Procedure: ESOPHAGOGASTRODUODENOSCOPY (EGD);  Surgeon: Wonda Horner, MD;  Location: [facility] ENDOSCOPY;  Service: Endoscopy;  Laterality: N/A;  . Esophagogastroduodenoscopy N/A 07/22/2012    Procedure: ESOPHAGOGASTRODUODENOSCOPY (EGD);  Surgeon: Missy Sabins, MD;  Location: [facility] ENDOSCOPY;  Service: Endoscopy;  Laterality: N/A;   Family History  Problem Relation Age of Onset  . Emphysema Brother   . Emphysema Brother   . Stroke Sister   . Hypertension Mother   . Breast cancer Sister    History  Substance Use Topics  . Smoking status: Former Smoker -- 1.00 packs/day for 50 years    Types: Cigarettes    Quit date: 01/21/2005  . Smokeless tobacco: Not on file  . Alcohol Use: No   OB History    No data available     Review of Systems  Unable to perform ROS: Other      Allergies  Spiriva handihaler  Home Medications   Prior to Admission medications   Medication Sig Start Date End Date  Taking? Authorizing Provider  acetaminophen (TYLENOL) 325 MG tablet Take 2 tablets (650 mg total) by mouth every 6 (six) hours as needed. 07/27/12   Velna Hatchet, MD  albuterol (PROVENTIL) (2.5 MG/3ML) 0.083% nebulizer solution Take 3 mLs (2.5 mg total) by nebulization every 2 (two) hours as needed for wheezing. 02/03/14   Rahul P Desai, PA-C  antiseptic oral rinse (CPC / CETYLPYRIDINIUM CHLORIDE 0.05%) 0.05 % LIQD solution 7 mLs by Mouth Rinse route QID. 02/03/14   Rahul P Desai, PA-C  aspirin 325 MG tablet Place 1 tablet (325 mg total) into feeding tube daily. 02/03/14   Rahul P Desai, PA-C  budesonide (PULMICORT) 0.25 MG/2ML nebulizer solution Take 2 mLs (0.25 mg total) by nebulization 2 (two) times daily. 02/03/14   Rahul P Desai, PA-C  Calcium Citrate-Vitamin D (CITRACAL + D PO) Take 1 tablet by mouth daily. Cacium 400 mg + Vitamin D (cholecalciferol) 500 I.U.    Historical Provider, MD  chlorhexidine (PERIDEX) 0.12 % solution 15 mLs by Mouth Rinse route 2 (two) times daily. 02/03/14   Rahul P Desai, PA-C  enoxaparin (LOVENOX) 40 MG/0.4ML injection Inject 0.4 mLs (40 mg total) into the skin daily. 02/03/14   Rahul P Desai, PA-C  feeding supplement, ENSURE, (ENSURE) PUDG Take 1 Container by mouth 3 (three) times daily between meals. 02/03/14   Rahul  P Desai, PA-C  fentaNYL (SUBLIMAZE) 0.05 MG/ML injection Inject 0.5 mLs (25 mcg total) into the vein every 2 (two) hours as needed for severe pain. 02/03/14   Rahul P Desai, PA-C  ipratropium-albuterol (DUONEB) 0.5-2.5 (3) MG/3ML SOLN Take 3 mLs by nebulization 3 (three) times daily. 02/03/14   Rahul P Desai, PA-C  iron polysaccharides (NIFEREX) 150 MG capsule Take 1 capsule (150 mg total) by mouth daily. 07/27/12   Velna Hatchet, MD  metoprolol tartrate (LOPRESSOR) 25 mg/10 mL SUSP Place 5 mLs (12.5 mg total) into feeding tube 2 (two) times daily. 02/03/14   Rahul P Desai, PA-C  pantoprazole (PROTONIX) 40 MG tablet Take 1 tablet (40 mg total) by mouth daily at 12  noon. 07/17/12   Velna Hatchet, MD  rOPINIRole (REQUIP) 0.5 MG tablet Take 1 tablet (0.5 mg total) by mouth at bedtime. 07/27/12   Velna Hatchet, MD  Vitamins A & D (VITAMIN A & D) ointment Apply topically as needed for dry skin. 02/03/14   Rahul P Desai, PA-C   BP 104/53 mmHg  Pulse 104  Resp 35  SpO2 95% Physical Exam  Constitutional: She is oriented to person, place, and time. She appears well-developed and well-nourished. No distress.  HENT:  Head: Normocephalic and atraumatic.  Eyes: Pupils are equal, round, and reactive to light.  Neck: Normal range of motion.  Cardiovascular: Normal rate and intact distal pulses.   Pulmonary/Chest: No respiratory distress. She has no wheezes. She has no rales.  Patient with tracheostomy and flow by.  Abdominal: Normal appearance. She exhibits no distension. There is no tenderness. There is no rebound.  Musculoskeletal: Normal range of motion.  Neurological: She is alert and oriented to person, place, and time. No cranial nerve deficit.  Skin: Skin is warm and dry. No rash noted.  Psychiatric: She has a normal mood and affect. Her behavior is normal.  Nursing note and vitals reviewed.   ED Course  Procedures (including critical care time) Labs Review Labs Reviewed  CBC WITH DIFFERENTIAL - Abnormal; Notable for the following:    WBC 12.0 (*)    RBC 3.09 (*)    Hemoglobin 9.6 (*)    HCT 30.0 (*)    Neutrophils Relative % 88 (*)    Neutro Abs 10.7 (*)    Lymphocytes Relative 6 (*)    All other components within normal limits  BASIC METABOLIC PANEL - Abnormal; Notable for the following:    Glucose, Bld 152 (*)    BUN 27 (*)    GFR calc non Af Amer 60 (*)    GFR calc Af Amer 69 (*)    All other components within normal limits  BRAIN NATRIURETIC PEPTIDE - Abnormal; Notable for the following:    B Natriuretic Peptide 463.5 (*)    All other components within normal limits  I-STAT TROPOININ, ED - Abnormal; Notable for the following:     Troponin i, poc 0.18 (*)    All other components within normal limits    Review of labs revealed that the slightly elevated troponin is actually dramatically decreased from her previous troponin which had been trending downward.  This appears to be not related to this visit and related to her previous  NSTEMI. Imaging Review Dg Chest Portable 1 View  02/04/2014   CLINICAL DATA:  Shortness of breath.  EXAM: PORTABLE CHEST - 1 VIEW  COMPARISON:  February 03, 2014.  FINDINGS: Stable cardiomediastinal silhouette. No pneumothorax or pleural effusion is noted. Tracheostomy  is in grossly good position. Mildly increased interstitial densities are noted throughout both lungs which may represent minimal pulmonary edema. New linear opacity is noted in right upper lobe most consistent with subsegmental atelectasis. Bony thorax is intact  IMPRESSION: Minimally increased interstitial densities are noted throughout both lungs which may represent minimal pulmonary edema. Probable mild focus of right upper lobe subsegmental atelectasis is noted.   Electronically Signed   By: Sabino Dick M.D.   On: 02/04/2014 11:39     EKG Interpretation   Date/Time:  Friday February 04 2014 11:30:16 EST Ventricular Rate:  97 PR Interval:  131 QRS Duration: 83 QT Interval:  356 QTC Calculation: 452 R Axis:   23 Text Interpretation:  Sinus rhythm Ventricular premature complex LAE,  consider biatrial enlargement Probable left ventricular hypertrophy  Abnormal inferior Q waves Anterior ST elevation, probably due to LVH No  significant change since last tracing Confirmed by Amit Meloy  MD, Jennie Bolar  ([id number]) on 02/04/2014 11:35:09 AM     Patient was seen by critical care medicine who felt that she was stable for discharge back to the [facility] unit. MDM   Final diagnoses:  SOB (shortness of breath)        Dot Lanes, MD 02/05/14 [phone]

## 2014-02-04 NOTE — ED Notes (Addendum)
[facility] called for transport.  [facility] called to notify of pt's return and to discuss d/c instructions.  Spoke with Marzetta Board, RN.

## 2014-02-04 NOTE — Discharge Instructions (Signed)
Shortness of Breath °Shortness of breath means you have trouble breathing. Shortness of breath needs medical care right away. °HOME CARE  °· Do not smoke. °· Avoid being around chemicals or things (paint fumes, dust) that may bother your breathing. °· Rest as needed. Slowly begin your normal activities. °· Only take medicines as told by your doctor. °· Keep all doctor visits as told. °GET HELP RIGHT AWAY IF:  °· Your shortness of breath gets worse. °· You feel lightheaded, pass out (faint), or have a cough that is not helped by medicine. °· You cough up blood. °· You have pain with breathing. °· You have pain in your chest, arms, shoulders, or belly (abdomen). °· You have a fever. °· You cannot walk up stairs or exercise the way you normally do. °· You do not get better in the time expected. °· You have a hard time doing normal activities even with rest. °· You have problems with your medicines. °· You have any new symptoms. °MAKE SURE YOU: °· Understand these instructions. °· Will watch your condition. °· Will get help right away if you are not doing well or get worse. °Document Released: 06/26/2007 Document Revised: 01/12/2013 Document Reviewed: 03/25/2011 °ExitCare® Patient Information ©2015 [employer]. This information is not intended to replace advice given to you by your health care provider. Make sure you discuss any questions you have with your health care provider. ° °

## 2014-02-04 NOTE — ED Notes (Signed)
Troponin results given to Dr. Audie Pinto

## 2014-02-04 NOTE — ED Notes (Signed)
Called [facility] 

## 2014-02-04 NOTE — Consult Note (Signed)
Name: Tanya Butler MRN: [id number] DOB: 1926/06/20    ADMISSION DATE:  02/04/2014 CONSULTATION DATE: 1/15  REFERRING MD :  Audie Pinto  CHIEF COMPLAINT:  Hemoptysis    HISTORY OF PRESENT ILLNESS:   81 yo WF, [facility] SNF 02/03/14 with extensive history and with tracheostomy dependence and she represents to [facility] ED 1/15 with hemoptysis most likely from trauma from suctioning. She has elevated troponin   but they are better than on DC day and she was + for NSTEMI. Her hemoptysis is cleared and she will be returned to SNF with instructions not to deep suction.  PAST MEDICAL HISTORY :   has a past medical history of HTN (hypertension); Hyperlipidemia; Restless leg syndrome; Osteoporosis; GERD (gastroesophageal reflux disease); COPD (chronic obstructive pulmonary disease); Hypertonicity of bladder; and Skin cancer.  has past surgical history that includes Skin cancer excision; Esophagogastroduodenoscopy (N/A, 07/15/2012); and Esophagogastroduodenoscopy (N/A, 07/22/2012). Prior to Admission medications   Medication Sig Start Date End Date Taking? Authorizing Provider  acetaminophen (TYLENOL) 325 MG tablet Take 2 tablets (650 mg total) by mouth every 6 (six) hours as needed. 07/27/12   Velna Hatchet, MD  albuterol (PROVENTIL) (2.5 MG/3ML) 0.083% nebulizer solution Take 3 mLs (2.5 mg total) by nebulization every 2 (two) hours as needed for wheezing. 02/03/14   Rahul P Desai, PA-C  antiseptic oral rinse (CPC / CETYLPYRIDINIUM CHLORIDE 0.05%) 0.05 % LIQD solution 7 mLs by Mouth Rinse route QID. 02/03/14   Rahul P Desai, PA-C  aspirin 325 MG tablet Place 1 tablet (325 mg total) into feeding tube daily. 02/03/14   Rahul P Desai, PA-C  budesonide (PULMICORT) 0.25 MG/2ML nebulizer solution Take 2 mLs (0.25 mg total) by nebulization 2 (two) times daily. 02/03/14   Rahul P Desai, PA-C  Calcium Citrate-Vitamin D (CITRACAL + D PO) Take 1 tablet by mouth daily. Cacium 400 mg + Vitamin D  (cholecalciferol) 500 I.U.    Historical Provider, MD  chlorhexidine (PERIDEX) 0.12 % solution 15 mLs by Mouth Rinse route 2 (two) times daily. 02/03/14   Rahul P Desai, PA-C  enoxaparin (LOVENOX) 40 MG/0.4ML injection Inject 0.4 mLs (40 mg total) into the skin daily. 02/03/14   Rahul P Desai, PA-C  feeding supplement, ENSURE, (ENSURE) PUDG Take 1 Container by mouth 3 (three) times daily between meals. 02/03/14   Rahul P Desai, PA-C  fentaNYL (SUBLIMAZE) 0.05 MG/ML injection Inject 0.5 mLs (25 mcg total) into the vein every 2 (two) hours as needed for severe pain. 02/03/14   Rahul P Desai, PA-C  ipratropium-albuterol (DUONEB) 0.5-2.5 (3) MG/3ML SOLN Take 3 mLs by nebulization 3 (three) times daily. 02/03/14   Rahul P Desai, PA-C  iron polysaccharides (NIFEREX) 150 MG capsule Take 1 capsule (150 mg total) by mouth daily. 07/27/12   Velna Hatchet, MD  metoprolol tartrate (LOPRESSOR) 25 mg/10 mL SUSP Place 5 mLs (12.5 mg total) into feeding tube 2 (two) times daily. 02/03/14   Rahul P Desai, PA-C  pantoprazole (PROTONIX) 40 MG tablet Take 1 tablet (40 mg total) by mouth daily at 12 noon. 07/17/12   Velna Hatchet, MD  rOPINIRole (REQUIP) 0.5 MG tablet Take 1 tablet (0.5 mg total) by mouth at bedtime. 07/27/12   Velna Hatchet, MD  Vitamins A & D (VITAMIN A & D) ointment Apply topically as needed for dry skin. 02/03/14   Rahul P Desai, PA-C   Allergies  Allergen Reactions  . Spiriva Handihaler [Tiotropium Bromide Monohydrate]     REACTION: pt  stated made her breathing worse    FAMILY HISTORY:  family history includes Breast cancer in her sister; Emphysema in her brother and brother; Hypertension in her mother; Stroke in her sister. SOCIAL HISTORY:  reports that she quit smoking about 9 years ago. Her smoking use included Cigarettes. She has a 50 pack-year smoking history. She does not have any smokeless tobacco history on file. She reports that she does not drink alcohol.  REVIEW OF SYSTEMS:   10 point  review of system taken, please see HPI for positives and negatives.   SUBJECTIVE: reports breathing is the same as when was discharged yesterday.  VITAL SIGNS: Temp:  [98.4 F (36.9 C)] 98.4 F (36.9 C) (01/14 1605) Pulse Rate:  [91-100] 96 (01/15 1342) Resp:  [25-33] 32 (01/15 1342) BP: (101-128)/(54-62) 101/54 mmHg (01/15 1342) SpO2:  [95 %-100 %] 98 % (01/15 1245) FiO2 (%):  [28 %-35 %] 28 % (01/15 1342)  PHYSICAL EXAMINATION: General:  NAD wf Neuro:  intact HEENT:  Trach in place Cardiovascular:  HSIR Lungs:  Mild rhonchi Abdomen:  Soft Musculoskeletal:  intact Skin:  warm   Recent Labs Lab 02/02/14 0729 02/03/14 0238 02/04/14 1056  NA 140 144 141  K 3.3* 4.5 4.7  CL 101 109 106  CO2 31 34* 26  BUN 34* 25* 27*  CREATININE 0.75 0.84 0.86  GLUCOSE 137* 123* 152*    Recent Labs Lab 02/02/14 0729 02/03/14 0238 02/04/14 1056  HGB 9.9* 9.0* 9.6*  HCT 31.0* 28.4* 30.0*  WBC 15.4* 16.7* 12.0*  PLT 198 202 224   Dg Chest Portable 1 View  02/04/2014   CLINICAL DATA:  Shortness of breath.  EXAM: PORTABLE CHEST - 1 VIEW  COMPARISON:  February 03, 2014.  FINDINGS: Stable cardiomediastinal silhouette. No pneumothorax or pleural effusion is noted. Tracheostomy is in grossly good position. Mildly increased interstitial densities are noted throughout both lungs which may represent minimal pulmonary edema. New linear opacity is noted in right upper lobe most consistent with subsegmental atelectasis. Bony thorax is intact  IMPRESSION: Minimally increased interstitial densities are noted throughout both lungs which may represent minimal pulmonary edema. Probable mild focus of right upper lobe subsegmental atelectasis is noted.   Electronically Signed   By: Sabino Dick M.D.   On: 02/04/2014 11:39   Dg Chest Port 1 View  02/03/2014   CLINICAL DATA:  Respiratory failure, tracheostomy patient  EXAM: PORTABLE CHEST - 1 VIEW  COMPARISON:  Portable chest x-ray of January 31, 2014   FINDINGS: The lungs are less well inflated today. The interstitial markings remain mildly increased. The cardiopericardial silhouette is enlarged. The pulmonary vascularity is not engorged. The tracheostomy appliance tip projects between the clavicular heads. The feeding tube has been removed.  IMPRESSION: Allowing for differences in inflation there has not been significant interval change in the appearance of the chest. There is low-grade CHF. There is no alveolar pneumonia.   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 She has elevated troponin   but they are better than on DC day and she was + for NSTEMI. Her hemoptysis is cleared and she will be returned to SNF with instructions not to deep suction.  PLAN: Stop deep suctioning Troponin's are decreasing Return to [facility] ACNP Maryanna Shape PCCM Pager [phone] till 3 pm If no answer page [phone] 02/04/2014, 2:19 PM  Patient may return to SNF, the hemoptysis is due to suction.  Troponin continue to drop.  Frequent deep suction causing hemoptysis.  Ulceration noted under the trach flap.  4*4 placed there.  Recommend no deep suction and recommend skin protection.  May go back to SNF from a strictly respiratory standpoint.  Patient seen and examined, agree with above note.  I dictated the care  and orders written for this patient under my direction.  Rush Farmer, MD [phone]

## 2014-03-22 DEATH — deceased

## 2014-04-05 ENCOUNTER — Inpatient Hospital Stay (HOSPITAL_COMMUNITY): Admission: RE | Admit: 2014-04-05 | Payer: Medicare Other | Source: Ambulatory Visit

## 2014-04-20 ENCOUNTER — Telehealth: Payer: Self-pay | Admitting: Emergency Medicine

## 2014-04-20 NOTE — Telephone Encounter (Signed)
Will route message to RB so that he will be aware.

## 2015-06-12 IMAGING — CR DG ABD PORTABLE 1V
1 series · 1 of 1 positions shown · non-contrast
Comparison: 01/26/2014.

CLINICAL DATA: Tube placement.

EXAM:
PORTABLE ABDOMEN - 1 VIEW

[supine ap]
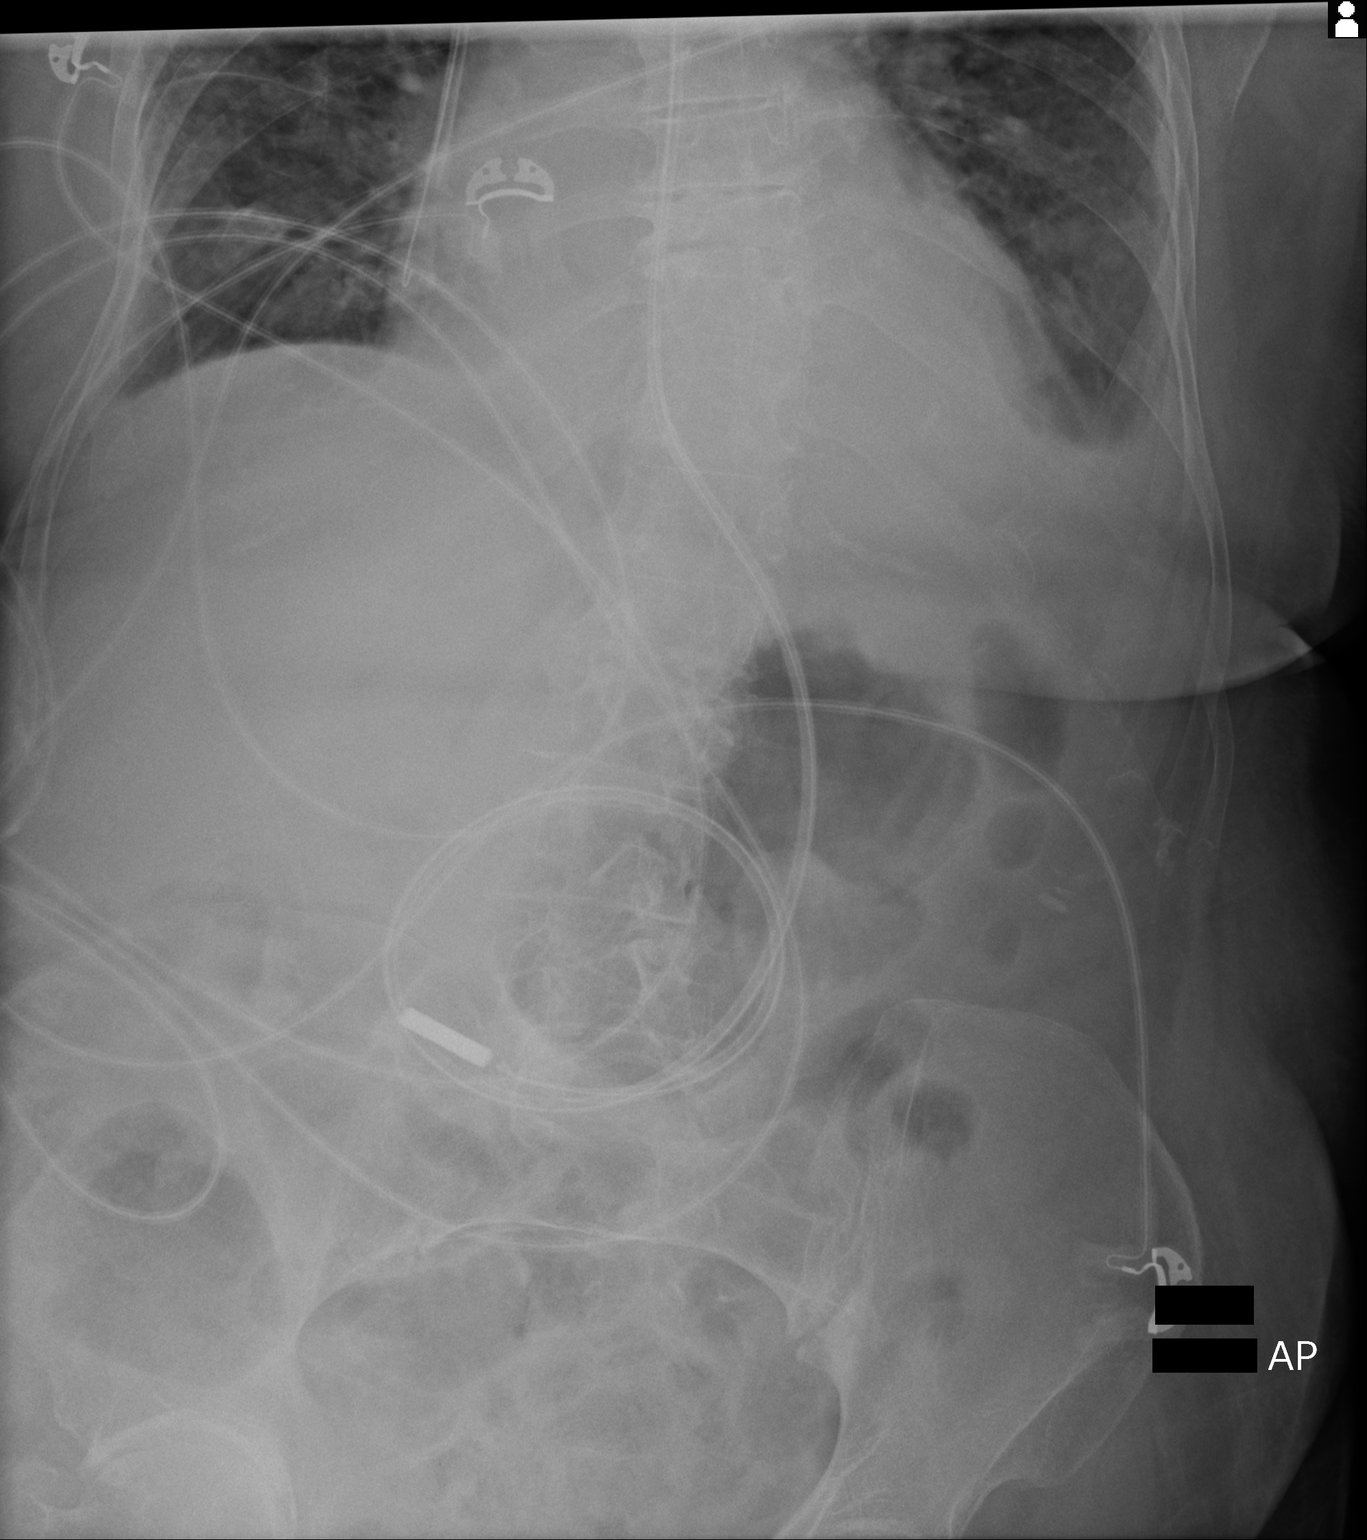

[1 of 1 positions shown; findings below may reference images not displayed]

FINDINGS: Feeding tube again noted projected over the distal stomach. Gas
pattern is nonspecific. No free air identified. Cardiomegaly. Left
lower lobe atelectasis and/or infiltrate.Left pleural effusion.
IMPRESSION: 1. Feeding tube in stable position.
2. Left lower lobe atelectasis and/or infiltrate with left pleural
effusion.
3. Cardiomegaly.

## 2015-06-20 IMAGING — CR DG CHEST 1V PORT
1 series · 1 of 1 positions shown · non-contrast
Comparison: February 03, 2014.

CLINICAL DATA: Shortness of breath.

EXAM:
PORTABLE CHEST - 1 VIEW

[portable]
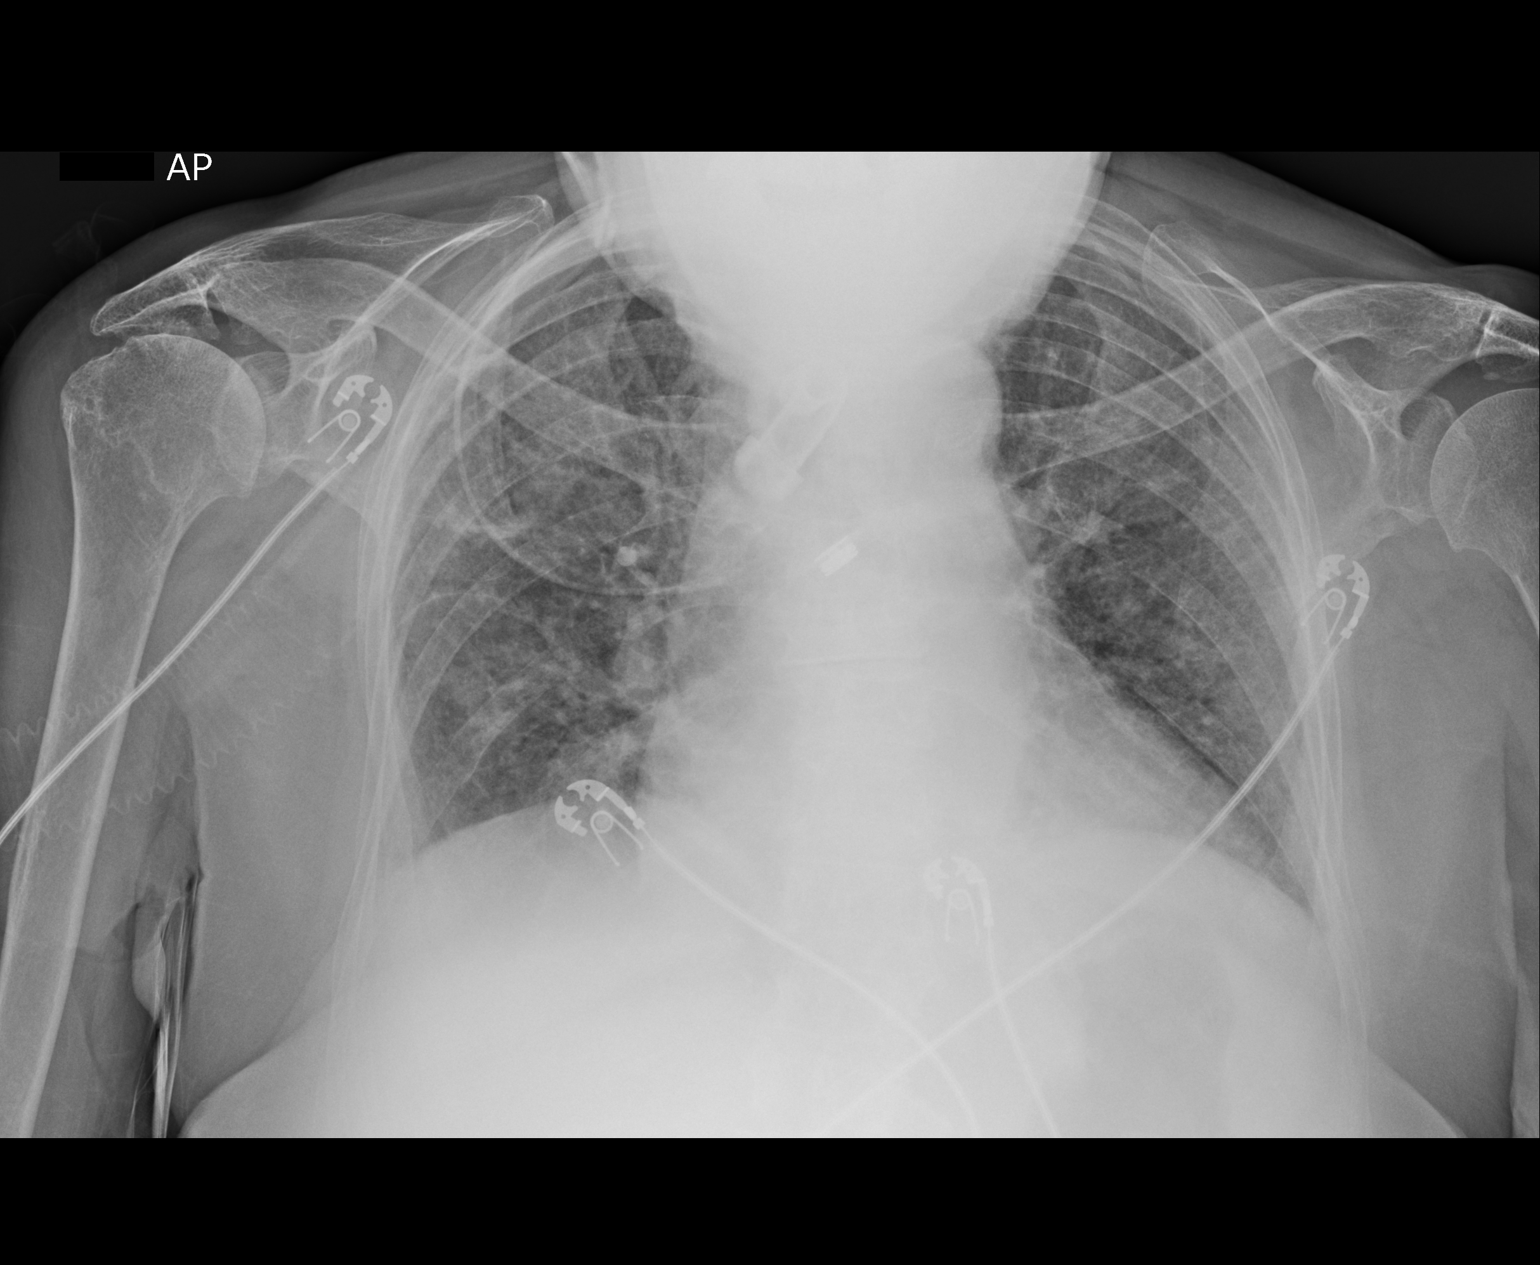

[1 of 1 positions shown; findings below may reference images not displayed]

FINDINGS: Stable cardiomediastinal silhouette. No pneumothorax or pleural
effusion is noted. Tracheostomy is in grossly good position. Mildly
increased interstitial densities are noted throughout both lungs
which may represent minimal pulmonary edema. New linear opacity is
noted in right upper lobe most consistent with subsegmental
atelectasis. Bony thorax is intact
IMPRESSION: Minimally increased interstitial densities are noted throughout both
lungs which may represent minimal pulmonary edema. Probable mild
focus of right upper lobe subsegmental atelectasis is noted.
